# Patient Record
Sex: Male | Born: 1940 | Race: White | Hispanic: No | Marital: Married | Smoking: Former smoker
Health system: Southern US, Community
[De-identification: ages and names within clinical notes are randomized; demographics above are authoritative.]

## PROBLEM LIST (undated history)

## (undated) DIAGNOSIS — I442 Atrioventricular block, complete: Secondary | ICD-10-CM

## (undated) DIAGNOSIS — N200 Calculus of kidney: Secondary | ICD-10-CM

## (undated) DIAGNOSIS — Z950 Presence of cardiac pacemaker: Secondary | ICD-10-CM

## (undated) DIAGNOSIS — I10 Essential (primary) hypertension: Secondary | ICD-10-CM

## (undated) DIAGNOSIS — G4733 Obstructive sleep apnea (adult) (pediatric): Secondary | ICD-10-CM

## (undated) DIAGNOSIS — N186 End stage renal disease: Secondary | ICD-10-CM

## (undated) DIAGNOSIS — K219 Gastro-esophageal reflux disease without esophagitis: Secondary | ICD-10-CM

## (undated) DIAGNOSIS — I2510 Atherosclerotic heart disease of native coronary artery without angina pectoris: Secondary | ICD-10-CM

## (undated) DIAGNOSIS — Z992 Dependence on renal dialysis: Secondary | ICD-10-CM

## (undated) DIAGNOSIS — Z9989 Dependence on other enabling machines and devices: Secondary | ICD-10-CM

## (undated) DIAGNOSIS — H409 Unspecified glaucoma: Secondary | ICD-10-CM

## (undated) DIAGNOSIS — M1990 Unspecified osteoarthritis, unspecified site: Secondary | ICD-10-CM

## (undated) DIAGNOSIS — E119 Type 2 diabetes mellitus without complications: Secondary | ICD-10-CM

## (undated) DIAGNOSIS — H5440 Blindness, one eye, unspecified eye: Secondary | ICD-10-CM

## (undated) DIAGNOSIS — R0902 Hypoxemia: Secondary | ICD-10-CM

## (undated) HISTORY — PX: SHOULDER ARTHROSCOPY: SHX128

## (undated) HISTORY — PX: EYE SURGERY: SHX253

## (undated) HISTORY — PX: OTHER SURGICAL HISTORY: SHX169

## (undated) HISTORY — PX: TONSILLECTOMY: SUR1361

## (undated) HISTORY — DX: Atherosclerotic heart disease of native coronary artery without angina pectoris: I25.10

## (undated) HISTORY — PX: INSERT / REPLACE / REMOVE PACEMAKER: SUR710

## (undated) HISTORY — PX: KNEE ARTHROSCOPY: SUR90

---

## 1997-09-08 HISTORY — PX: CORONARY ANGIOPLASTY WITH STENT PLACEMENT: SHX49

## 1997-12-19 ENCOUNTER — Ambulatory Visit (HOSPITAL_COMMUNITY): Admission: RE | Admit: 1997-12-19 | Discharge: 1997-12-20 | Payer: Self-pay | Admitting: Thoracic Surgery

## 1998-01-16 ENCOUNTER — Encounter (HOSPITAL_COMMUNITY): Admission: RE | Admit: 1998-01-16 | Discharge: 1998-04-16 | Payer: Self-pay | Admitting: Cardiovascular Disease

## 1998-05-29 ENCOUNTER — Encounter: Payer: Self-pay | Admitting: Pulmonary Disease

## 1998-05-29 ENCOUNTER — Ambulatory Visit: Admission: RE | Admit: 1998-05-29 | Discharge: 1998-05-29 | Payer: Self-pay | Admitting: Internal Medicine

## 1998-08-07 ENCOUNTER — Encounter: Admission: RE | Admit: 1998-08-07 | Discharge: 1998-09-24 | Payer: Self-pay | Admitting: Internal Medicine

## 1998-08-08 ENCOUNTER — Ambulatory Visit: Admission: RE | Admit: 1998-08-08 | Discharge: 1998-08-08 | Payer: Self-pay | Admitting: Orthopedic Surgery

## 1998-09-04 ENCOUNTER — Encounter (HOSPITAL_BASED_OUTPATIENT_CLINIC_OR_DEPARTMENT_OTHER): Payer: Self-pay | Admitting: Internal Medicine

## 1998-09-24 ENCOUNTER — Encounter: Admission: RE | Admit: 1998-09-24 | Discharge: 1998-10-25 | Payer: Self-pay | Admitting: Internal Medicine

## 1998-09-25 ENCOUNTER — Encounter: Payer: Self-pay | Admitting: Orthopedic Surgery

## 1999-01-22 ENCOUNTER — Encounter: Admission: RE | Admit: 1999-01-22 | Discharge: 1999-04-22 | Payer: Self-pay | Admitting: Orthopedic Surgery

## 1999-05-14 ENCOUNTER — Ambulatory Visit: Admission: RE | Admit: 1999-05-14 | Discharge: 1999-05-14 | Payer: Self-pay | Admitting: Internal Medicine

## 1999-05-21 ENCOUNTER — Ambulatory Visit (HOSPITAL_COMMUNITY): Admission: RE | Admit: 1999-05-21 | Discharge: 1999-05-21 | Payer: Self-pay | Admitting: Gastroenterology

## 1999-07-01 ENCOUNTER — Encounter: Admission: RE | Admit: 1999-07-01 | Discharge: 1999-09-29 | Payer: Self-pay | Admitting: Orthopedic Surgery

## 1999-07-22 ENCOUNTER — Encounter: Payer: Self-pay | Admitting: Pulmonary Disease

## 1999-10-11 ENCOUNTER — Ambulatory Visit: Admission: RE | Admit: 1999-10-11 | Discharge: 1999-10-11 | Payer: Self-pay | Admitting: Pulmonary Disease

## 1999-10-11 ENCOUNTER — Encounter: Payer: Self-pay | Admitting: Pulmonary Disease

## 1999-10-14 ENCOUNTER — Encounter: Admission: RE | Admit: 1999-10-14 | Discharge: 2000-01-12 | Payer: Self-pay | Admitting: Orthopedic Surgery

## 2000-05-19 ENCOUNTER — Ambulatory Visit (HOSPITAL_COMMUNITY): Admission: RE | Admit: 2000-05-19 | Discharge: 2000-05-19 | Payer: Self-pay | Admitting: Ophthalmology

## 2000-08-26 ENCOUNTER — Ambulatory Visit (HOSPITAL_COMMUNITY): Admission: RE | Admit: 2000-08-26 | Discharge: 2000-08-26 | Payer: Self-pay | Admitting: Gastroenterology

## 2000-10-26 ENCOUNTER — Emergency Department (HOSPITAL_COMMUNITY): Admission: EM | Admit: 2000-10-26 | Discharge: 2000-10-26 | Payer: Self-pay | Admitting: Emergency Medicine

## 2000-10-26 ENCOUNTER — Encounter: Payer: Self-pay | Admitting: Emergency Medicine

## 2001-11-30 ENCOUNTER — Encounter (INDEPENDENT_AMBULATORY_CARE_PROVIDER_SITE_OTHER): Payer: Self-pay | Admitting: Specialist

## 2001-11-30 ENCOUNTER — Ambulatory Visit (HOSPITAL_BASED_OUTPATIENT_CLINIC_OR_DEPARTMENT_OTHER): Admission: RE | Admit: 2001-11-30 | Discharge: 2001-11-30 | Payer: Self-pay | Admitting: Orthopedic Surgery

## 2002-01-24 ENCOUNTER — Encounter (HOSPITAL_BASED_OUTPATIENT_CLINIC_OR_DEPARTMENT_OTHER): Admission: RE | Admit: 2002-01-24 | Discharge: 2002-01-27 | Payer: Self-pay | Admitting: Orthopedic Surgery

## 2002-04-26 ENCOUNTER — Encounter: Payer: Self-pay | Admitting: Orthopedic Surgery

## 2002-04-26 ENCOUNTER — Ambulatory Visit (HOSPITAL_COMMUNITY): Admission: RE | Admit: 2002-04-26 | Discharge: 2002-04-26 | Payer: Self-pay | Admitting: Orthopedic Surgery

## 2003-07-30 ENCOUNTER — Emergency Department (HOSPITAL_COMMUNITY): Admission: AD | Admit: 2003-07-30 | Discharge: 2003-07-30 | Payer: Self-pay | Admitting: Family Medicine

## 2003-09-19 ENCOUNTER — Ambulatory Visit (HOSPITAL_COMMUNITY): Admission: RE | Admit: 2003-09-19 | Discharge: 2003-09-19 | Payer: Self-pay | Admitting: Gastroenterology

## 2003-12-04 ENCOUNTER — Emergency Department (HOSPITAL_COMMUNITY): Admission: EM | Admit: 2003-12-04 | Discharge: 2003-12-04 | Payer: Self-pay | Admitting: Family Medicine

## 2004-05-17 ENCOUNTER — Encounter: Payer: Self-pay | Admitting: Pulmonary Disease

## 2004-05-22 ENCOUNTER — Encounter: Payer: Self-pay | Admitting: Pulmonary Disease

## 2004-06-21 ENCOUNTER — Ambulatory Visit (HOSPITAL_COMMUNITY): Admission: RE | Admit: 2004-06-21 | Discharge: 2004-06-22 | Payer: Self-pay | Admitting: Surgery

## 2005-01-17 ENCOUNTER — Emergency Department (HOSPITAL_COMMUNITY): Admission: EM | Admit: 2005-01-17 | Discharge: 2005-01-17 | Payer: Self-pay | Admitting: Family Medicine

## 2005-01-23 ENCOUNTER — Encounter (INDEPENDENT_AMBULATORY_CARE_PROVIDER_SITE_OTHER): Payer: Self-pay | Admitting: Specialist

## 2005-01-23 ENCOUNTER — Ambulatory Visit (HOSPITAL_COMMUNITY): Admission: RE | Admit: 2005-01-23 | Discharge: 2005-01-23 | Payer: Self-pay | Admitting: Surgery

## 2005-01-23 ENCOUNTER — Ambulatory Visit (HOSPITAL_BASED_OUTPATIENT_CLINIC_OR_DEPARTMENT_OTHER): Admission: RE | Admit: 2005-01-23 | Discharge: 2005-01-23 | Payer: Self-pay | Admitting: Surgery

## 2005-05-31 ENCOUNTER — Emergency Department (HOSPITAL_COMMUNITY): Admission: EM | Admit: 2005-05-31 | Discharge: 2005-05-31 | Payer: Self-pay | Admitting: Emergency Medicine

## 2005-08-12 ENCOUNTER — Ambulatory Visit (HOSPITAL_COMMUNITY): Admission: RE | Admit: 2005-08-12 | Discharge: 2005-08-12 | Payer: Self-pay | Admitting: Ophthalmology

## 2005-09-19 ENCOUNTER — Encounter: Admission: RE | Admit: 2005-09-19 | Discharge: 2005-09-19 | Payer: Self-pay | Admitting: Orthopedic Surgery

## 2006-04-22 ENCOUNTER — Inpatient Hospital Stay (HOSPITAL_COMMUNITY): Admission: RE | Admit: 2006-04-22 | Discharge: 2006-04-23 | Payer: Self-pay | Admitting: Ophthalmology

## 2006-08-17 ENCOUNTER — Ambulatory Visit (HOSPITAL_COMMUNITY): Admission: RE | Admit: 2006-08-17 | Discharge: 2006-08-17 | Payer: Self-pay | Admitting: Ophthalmology

## 2006-08-28 ENCOUNTER — Ambulatory Visit (HOSPITAL_COMMUNITY): Admission: RE | Admit: 2006-08-28 | Discharge: 2006-08-28 | Payer: Self-pay | Admitting: Ophthalmology

## 2006-09-14 ENCOUNTER — Ambulatory Visit (HOSPITAL_COMMUNITY): Admission: RE | Admit: 2006-09-14 | Discharge: 2006-09-14 | Payer: Self-pay | Admitting: Ophthalmology

## 2007-01-04 ENCOUNTER — Ambulatory Visit (HOSPITAL_COMMUNITY): Admission: RE | Admit: 2007-01-04 | Discharge: 2007-01-04 | Payer: Self-pay | Admitting: Ophthalmology

## 2008-09-08 HISTORY — PX: DG AV DIALYSIS  SHUNT ACCESS EXIST*L* OR: HXRAD910

## 2008-10-19 ENCOUNTER — Encounter: Payer: Self-pay | Admitting: Pulmonary Disease

## 2008-11-30 ENCOUNTER — Encounter: Payer: Self-pay | Admitting: Pulmonary Disease

## 2008-12-20 DIAGNOSIS — J4489 Other specified chronic obstructive pulmonary disease: Secondary | ICD-10-CM | POA: Insufficient documentation

## 2008-12-20 DIAGNOSIS — M159 Polyosteoarthritis, unspecified: Secondary | ICD-10-CM | POA: Insufficient documentation

## 2008-12-20 DIAGNOSIS — G4733 Obstructive sleep apnea (adult) (pediatric): Secondary | ICD-10-CM | POA: Insufficient documentation

## 2008-12-20 DIAGNOSIS — J449 Chronic obstructive pulmonary disease, unspecified: Secondary | ICD-10-CM | POA: Insufficient documentation

## 2008-12-20 DIAGNOSIS — Z862 Personal history of diseases of the blood and blood-forming organs and certain disorders involving the immune mechanism: Secondary | ICD-10-CM

## 2008-12-20 DIAGNOSIS — Z8639 Personal history of other endocrine, nutritional and metabolic disease: Secondary | ICD-10-CM

## 2008-12-21 ENCOUNTER — Ambulatory Visit: Payer: Self-pay | Admitting: Pulmonary Disease

## 2008-12-21 DIAGNOSIS — R0602 Shortness of breath: Secondary | ICD-10-CM

## 2008-12-22 ENCOUNTER — Encounter: Payer: Self-pay | Admitting: Pulmonary Disease

## 2008-12-22 ENCOUNTER — Ambulatory Visit (HOSPITAL_COMMUNITY): Admission: RE | Admit: 2008-12-22 | Discharge: 2008-12-22 | Payer: Self-pay | Admitting: Pulmonary Disease

## 2009-01-11 ENCOUNTER — Telehealth (INDEPENDENT_AMBULATORY_CARE_PROVIDER_SITE_OTHER): Payer: Self-pay | Admitting: *Deleted

## 2009-01-15 ENCOUNTER — Ambulatory Visit: Payer: Self-pay | Admitting: Pulmonary Disease

## 2009-05-04 ENCOUNTER — Ambulatory Visit: Payer: Self-pay | Admitting: Vascular Surgery

## 2009-05-10 ENCOUNTER — Ambulatory Visit (HOSPITAL_COMMUNITY): Admission: RE | Admit: 2009-05-10 | Discharge: 2009-05-10 | Payer: Self-pay | Admitting: Vascular Surgery

## 2009-05-10 ENCOUNTER — Ambulatory Visit: Payer: Self-pay | Admitting: Vascular Surgery

## 2009-05-15 ENCOUNTER — Encounter: Admission: RE | Admit: 2009-05-15 | Discharge: 2009-05-15 | Payer: Self-pay | Admitting: Nephrology

## 2009-06-08 ENCOUNTER — Ambulatory Visit: Payer: Self-pay | Admitting: Vascular Surgery

## 2009-06-16 ENCOUNTER — Emergency Department (HOSPITAL_COMMUNITY): Admission: EM | Admit: 2009-06-16 | Discharge: 2009-06-16 | Payer: Self-pay | Admitting: Emergency Medicine

## 2009-06-20 ENCOUNTER — Ambulatory Visit: Payer: Self-pay | Admitting: Vascular Surgery

## 2009-07-04 ENCOUNTER — Ambulatory Visit: Payer: Self-pay | Admitting: Vascular Surgery

## 2009-08-07 ENCOUNTER — Encounter: Admission: RE | Admit: 2009-08-07 | Discharge: 2009-08-07 | Payer: Self-pay | Admitting: Nephrology

## 2009-08-16 ENCOUNTER — Observation Stay (HOSPITAL_COMMUNITY): Admission: RE | Admit: 2009-08-16 | Discharge: 2009-08-16 | Payer: Self-pay | Admitting: Nephrology

## 2009-08-21 ENCOUNTER — Encounter (HOSPITAL_COMMUNITY): Admission: RE | Admit: 2009-08-21 | Discharge: 2009-11-14 | Payer: Self-pay | Admitting: Nephrology

## 2009-09-21 ENCOUNTER — Encounter (HOSPITAL_COMMUNITY): Admission: RE | Admit: 2009-09-21 | Discharge: 2009-12-20 | Payer: Self-pay | Admitting: Nephrology

## 2010-01-30 ENCOUNTER — Ambulatory Visit (HOSPITAL_COMMUNITY): Admission: RE | Admit: 2010-01-30 | Discharge: 2010-01-30 | Payer: Self-pay | Admitting: Gastroenterology

## 2010-02-18 ENCOUNTER — Emergency Department (HOSPITAL_COMMUNITY): Admission: EM | Admit: 2010-02-18 | Discharge: 2010-02-18 | Payer: Self-pay | Admitting: Family Medicine

## 2010-02-18 ENCOUNTER — Encounter (HOSPITAL_COMMUNITY): Admission: RE | Admit: 2010-02-18 | Discharge: 2010-04-17 | Payer: Self-pay | Admitting: Nephrology

## 2010-02-18 ENCOUNTER — Emergency Department (HOSPITAL_COMMUNITY): Admission: EM | Admit: 2010-02-18 | Discharge: 2010-02-19 | Payer: Self-pay | Admitting: Emergency Medicine

## 2010-05-07 ENCOUNTER — Encounter (HOSPITAL_COMMUNITY)
Admission: RE | Admit: 2010-05-07 | Discharge: 2010-08-05 | Payer: Self-pay | Source: Home / Self Care | Admitting: Nephrology

## 2010-06-10 ENCOUNTER — Encounter (HOSPITAL_COMMUNITY)
Admission: RE | Admit: 2010-06-10 | Discharge: 2010-09-08 | Payer: Self-pay | Source: Home / Self Care | Attending: Nephrology | Admitting: Nephrology

## 2010-07-24 ENCOUNTER — Encounter: Admission: RE | Admit: 2010-07-24 | Discharge: 2010-07-24 | Payer: Self-pay | Admitting: Nephrology

## 2010-09-08 HISTORY — PX: DG AV DIALYSIS  SHUNT ACCESS EXIST*L* OR: HXRAD910

## 2010-10-15 ENCOUNTER — Inpatient Hospital Stay (HOSPITAL_COMMUNITY)
Admission: EM | Admit: 2010-10-15 | Discharge: 2010-10-17 | DRG: 202 | Disposition: A | Discharge status: Home / Self Care | Payer: Medicare Other | Source: Ambulatory Visit | Attending: Internal Medicine | Admitting: Internal Medicine

## 2010-10-15 ENCOUNTER — Emergency Department (HOSPITAL_COMMUNITY): Payer: Medicare Other

## 2010-10-15 DIAGNOSIS — D631 Anemia in chronic kidney disease: Secondary | ICD-10-CM | POA: Diagnosis present

## 2010-10-15 DIAGNOSIS — R0789 Other chest pain: Secondary | ICD-10-CM | POA: Diagnosis present

## 2010-10-15 DIAGNOSIS — E1142 Type 2 diabetes mellitus with diabetic polyneuropathy: Secondary | ICD-10-CM | POA: Diagnosis present

## 2010-10-15 DIAGNOSIS — Z794 Long term (current) use of insulin: Secondary | ICD-10-CM

## 2010-10-15 DIAGNOSIS — N058 Unspecified nephritic syndrome with other morphologic changes: Secondary | ICD-10-CM | POA: Diagnosis present

## 2010-10-15 DIAGNOSIS — I4510 Unspecified right bundle-branch block: Secondary | ICD-10-CM | POA: Diagnosis present

## 2010-10-15 DIAGNOSIS — Z79899 Other long term (current) drug therapy: Secondary | ICD-10-CM

## 2010-10-15 DIAGNOSIS — E1129 Type 2 diabetes mellitus with other diabetic kidney complication: Secondary | ICD-10-CM | POA: Diagnosis present

## 2010-10-15 DIAGNOSIS — G4733 Obstructive sleep apnea (adult) (pediatric): Secondary | ICD-10-CM | POA: Diagnosis present

## 2010-10-15 DIAGNOSIS — I2510 Atherosclerotic heart disease of native coronary artery without angina pectoris: Secondary | ICD-10-CM | POA: Diagnosis present

## 2010-10-15 DIAGNOSIS — E1149 Type 2 diabetes mellitus with other diabetic neurological complication: Secondary | ICD-10-CM | POA: Diagnosis present

## 2010-10-15 DIAGNOSIS — D869 Sarcoidosis, unspecified: Secondary | ICD-10-CM | POA: Diagnosis present

## 2010-10-15 DIAGNOSIS — N2581 Secondary hyperparathyroidism of renal origin: Secondary | ICD-10-CM | POA: Diagnosis present

## 2010-10-15 DIAGNOSIS — J99 Respiratory disorders in diseases classified elsewhere: Secondary | ICD-10-CM | POA: Diagnosis present

## 2010-10-15 DIAGNOSIS — E1139 Type 2 diabetes mellitus with other diabetic ophthalmic complication: Secondary | ICD-10-CM | POA: Diagnosis present

## 2010-10-15 DIAGNOSIS — M109 Gout, unspecified: Secondary | ICD-10-CM | POA: Diagnosis present

## 2010-10-15 DIAGNOSIS — E11319 Type 2 diabetes mellitus with unspecified diabetic retinopathy without macular edema: Secondary | ICD-10-CM | POA: Diagnosis present

## 2010-10-15 DIAGNOSIS — Z9861 Coronary angioplasty status: Secondary | ICD-10-CM

## 2010-10-15 DIAGNOSIS — N185 Chronic kidney disease, stage 5: Secondary | ICD-10-CM | POA: Diagnosis present

## 2010-10-15 DIAGNOSIS — Z7982 Long term (current) use of aspirin: Secondary | ICD-10-CM

## 2010-10-15 DIAGNOSIS — K219 Gastro-esophageal reflux disease without esophagitis: Secondary | ICD-10-CM | POA: Diagnosis present

## 2010-10-15 DIAGNOSIS — I120 Hypertensive chronic kidney disease with stage 5 chronic kidney disease or end stage renal disease: Secondary | ICD-10-CM | POA: Diagnosis present

## 2010-10-15 DIAGNOSIS — N039 Chronic nephritic syndrome with unspecified morphologic changes: Secondary | ICD-10-CM | POA: Diagnosis present

## 2010-10-15 DIAGNOSIS — E8779 Other fluid overload: Secondary | ICD-10-CM | POA: Diagnosis present

## 2010-10-15 DIAGNOSIS — J209 Acute bronchitis, unspecified: Principal | ICD-10-CM | POA: Diagnosis present

## 2010-10-15 LAB — URINALYSIS, ROUTINE W REFLEX MICROSCOPIC
Bilirubin Urine: NEGATIVE
Hgb urine dipstick: NEGATIVE
Urobilinogen, UA: 0.2 mg/dL (ref 0.0–1.0)

## 2010-10-15 LAB — CBC
HCT: 30.9 % — ABNORMAL LOW (ref 39.0–52.0)
Hemoglobin: 10.2 g/dL — ABNORMAL LOW (ref 13.0–17.0)
MCV: 99 fL (ref 78.0–100.0)
Platelets: 142 10*3/uL — ABNORMAL LOW (ref 150–400)
RBC: 3.12 MIL/uL — ABNORMAL LOW (ref 4.22–5.81)
WBC: 13.6 10*3/uL — ABNORMAL HIGH (ref 4.0–10.5)

## 2010-10-15 LAB — URINE MICROSCOPIC-ADD ON

## 2010-10-15 LAB — BASIC METABOLIC PANEL
Calcium: 9.5 mg/dL (ref 8.4–10.5)
Chloride: 109 mEq/L (ref 96–112)
GFR calc Af Amer: 9 mL/min — ABNORMAL LOW (ref >60)
Glucose, Bld: 130 mg/dL — ABNORMAL HIGH (ref 70–99)
Potassium: 4.8 mEq/L (ref 3.5–5.1)
Sodium: 144 mEq/L (ref 135–145)

## 2010-10-15 LAB — POCT CARDIAC MARKERS

## 2010-10-15 LAB — DIFFERENTIAL: Eosinophils Relative: 2 % (ref 0–5)

## 2010-10-16 ENCOUNTER — Inpatient Hospital Stay (HOSPITAL_COMMUNITY): Payer: Medicare Other

## 2010-10-16 DIAGNOSIS — M7989 Other specified soft tissue disorders: Secondary | ICD-10-CM

## 2010-10-16 LAB — CBC
MCH: 32.2 pg (ref 26.0–34.0)
MCHC: 32.1 g/dL (ref 30.0–36.0)
MCV: 100.4 fL — ABNORMAL HIGH (ref 78.0–100.0)
Platelets: 116 10*3/uL — ABNORMAL LOW (ref 150–400)
RDW: 15.3 % (ref 11.5–15.5)

## 2010-10-16 LAB — RENAL FUNCTION PANEL
Albumin: 3.2 g/dL — ABNORMAL LOW (ref 3.5–5.2)
BUN: 103 mg/dL — ABNORMAL HIGH (ref 6–23)
Creatinine, Ser: 7.24 mg/dL — ABNORMAL HIGH (ref 0.4–1.5)
Phosphorus: 5.9 mg/dL — ABNORMAL HIGH (ref 2.3–4.6)
Potassium: 4.9 mEq/L (ref 3.5–5.1)

## 2010-10-16 LAB — CK TOTAL AND CKMB (NOT AT ARMC)
Relative Index: 2.1 (ref 0.0–2.5)
Relative Index: INVALID (ref 0.0–2.5)
Relative Index: 2.8 — ABNORMAL HIGH (ref 0.0–2.5)
Total CK: 107 U/L (ref 7–232)
Total CK: 88 U/L (ref 7–232)
Total CK: 140 U/L (ref 7–232)

## 2010-10-16 LAB — GLUCOSE, CAPILLARY

## 2010-10-16 LAB — TROPONIN I
Troponin I: 0.02 ng/mL (ref 0.00–0.06)
Troponin I: 0.03 ng/mL (ref 0.00–0.06)

## 2010-10-16 MED ORDER — TECHNETIUM TO 99M ALBUMIN AGGREGATED
6.0000 | Freq: Once | INTRAVENOUS | Status: AC | PRN
  Administered 2010-10-16: 6 via INTRAVENOUS

## 2010-10-16 MED ORDER — XENON XE 133 GAS
10.0000 | GAS_FOR_INHALATION | Freq: Once | RESPIRATORY_TRACT | Status: AC | PRN
  Administered 2010-10-16: 10 via RESPIRATORY_TRACT

## 2010-10-17 LAB — RENAL FUNCTION PANEL
BUN: 112 mg/dL — ABNORMAL HIGH (ref 6–23)
CO2: 22 mEq/L (ref 19–32)
Glucose, Bld: 176 mg/dL — ABNORMAL HIGH (ref 70–99)
Potassium: 4.8 mEq/L (ref 3.5–5.1)
Sodium: 142 mEq/L (ref 135–145)

## 2010-10-17 LAB — GLUCOSE, CAPILLARY
Glucose-Capillary: 196 mg/dL — ABNORMAL HIGH (ref 70–99)
Glucose-Capillary: 137 mg/dL — ABNORMAL HIGH (ref 70–99)

## 2010-10-17 LAB — PTH, INTACT AND CALCIUM
Calcium, Total (PTH): 8.4 mg/dL (ref 8.4–10.5)
PTH: 304.9 pg/mL — ABNORMAL HIGH (ref 14.0–72.0)

## 2010-10-17 LAB — CBC
HCT: 26.8 % — ABNORMAL LOW (ref 39.0–52.0)
Hemoglobin: 8.7 g/dL — ABNORMAL LOW (ref 13.0–17.0)
MCHC: 32.5 g/dL (ref 30.0–36.0)
MCV: 99.6 fL (ref 78.0–100.0)

## 2010-10-21 NOTE — Discharge Summary (Signed)
NAME:  Antonio Page, Antonio Page NO.:  [id number]  MEDICAL RECORD NO.:  [id number]           PATIENT TYPE:  I  LOCATION:  6735                         FACILITY:  [facility]  PHYSICIAN:  Geoffry Paradise, M.D.  DATE OF BIRTH:  1941/05/12  DATE OF ADMISSION:  10/15/2010 DATE OF DISCHARGE:                              DISCHARGE SUMMARY   DIAGNOSES AT THE TIME OF DISCHARGE: 1. Acute bronchitis. 2. Atypical/pleuritic chest pain, noncardiac. 3. Diabetes mellitus with neuropathy, nephropathy, and retinopathy. 4. 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 5. Essential hypertension. 6. Atherosclerotic coronary artery disease status post PCI 1999. 7. Obstructive sleep apnea on CPAP. 8. History of sarcoidosis pulmonary.  HISTORY OF PRESENT ILLNESS:  [name] is a pleasant 70 year old well- known to myself for several years with prior coronary history including remote PCI, diabetes with nephropathy, retinopathy, and neuropathy, currently well controlled, chronic renal insufficiency stage V impending dialysis needs, sarcoidosis presenting with cough and chest pain.  He evidently had a 3-day history of productive cough with thick yellow sputum and some wheezing but because of the onset of severe substernal chest pain presented to the emergency room for fears of it being cardiac disease.  Upon further exploration, pain was sharp with a very clear pleuritic nature.  Additionally preceding this is a purulent bronchitic picture.  He has had no recent exertional type chest pain.  He had no bloody sputum, nausea, vomiting.  He has had some low-grade fevers.  He does have chronic lower extremity edema which is unchanged.  He was seen in the emergency room by my partner, admitted for IV antibiotics, bronchodilators, and monitoring to rule out cardiac disease but it was felt that this was of low likelihood.  For details, see the dictated summary February 2012.  DATA:  Chest  x-ray, bronchitic type changes, chronic elevation in left hemidiaphragm, otherwise clear.  Venous Doppler's bilateral were negative.  Chemistry:  Sodium 142, potassium 4.8, chloride 108, CO2 22, glucose 176, BUN 112, creatinine 7.84, calcium 8.7, phosphorous is 6.3. CBC:  Hemoglobin 8.7, hematocrit 26.8, white blood count is 8.9, platelet count is 112,000.  CKs were 140 and 88 respectively with insignificant MBs, troponin's were 0.03 and 0.03 respectively. Urinalysis negative.  HOSPITAL COURSE:  The patient was admitted, treated with bronchodilators, IV Avelox and cardiac enzymes were cycled to rule out possible underlying cardiac etiology.  All enzymes were negative in the clinical picture to include exam and history were most compatible with an acute bronchitis with pleuritic-type chest pain.  This indeed did resolve and improve with IV Avelox and bronchodilators as well as Tylenol.  We did check venous Doppler's with no evidence of DVT and again pulmonary embolism was not felt to be likely.  Renal did evaluate the patient to be certain dialysis needs were not impending.  It was felt at this time there were no dialysis needs and continued observation and close renal followup as an outpatient, all parties knowing dialysis is imminent.  On the morning of discharge, the patient was ready and insisting upon discharge as he was back to baseline despite my encouragement that it  was probably best to observe him one further day. Regardless upon his insistence and given his medical stability, he is discharged in improved and stable condition.  The patient is ambulating and tolerating his nasal CPAP at night.  His cough is minimal at this point with no further chest pain and lung fields are clear.  His lower extremity swelling is baseline with no evidence of calf symptoms.  He is eating well and is stable for discharge.  Blood sugars have been relatively stable and blood pressure while being  hypertensive at times is improving to be followed up as an outpatient as in the outpatient setting of these medications, he was doing quite well.  His Lasix has been increased by renal and this should assist with that as well.  The patient is discharged on a renal diet and this has been educated multiple times in the past.  He has also been educated on diabetes management.  Last A1c in the office was 6.8.  Discharge medications include: 1. Furosemide 160 b.i.d. 2. Avelox 400 daily for 7 days. 3. Allopurinol 300, 1/2 daily. 4. Amlodipine 10 daily. 5. Aspirin 81 mg daily. 6. Claritin 1 daily as needed. 7. 70/30 insulin, 40 units b.i.d. subcu. 8. Hydralazine 25 t.i.d. 9. MiraLax 17 grams every morning. 10.Nephro-Vite 1 every morning. 11.Prilosec/omeprazole 20 mg daily. 12.Renvela 800 mg 2 tablets 3 times a day with meals. 13.Sodium bicarbonate 325, 1 tablet 3 times a day. 14.Tums extra strength 2 tablets 3 times a day. 15.Tylenol 650 q.4 p.r.n. pain. 16.Vitamin C 500 every morning. 17.Zaroxolyn 5 mg 1 tablet by mouth daily as needed for excessive     fluid and he is instructed on this as well.  The patient will follow up with Dr. Jacky Kindle in approximately 4-6 weeks but he will have close followup with the renal team as instructed by them as dialysis needs are potentially impending.          ______________________________ Geoffry Paradise, M.D.     RA/MEDQ  D:  10/17/2010  T:  10/17/2010  Job:  [id number]  cc:   Dr. Allena Katz  Electronically Signed by Geoffry Paradise M.D. on 10/21/2010 09:21:38 PM

## 2010-10-28 NOTE — H&P (Signed)
NAME:  Antonio Page, Antonio Page NO.:  [id number]  MEDICAL RECORD NO.:  [id number]           PATIENT TYPE:  E  LOCATION:  [facility]                         FACILITY:  [facility]  PHYSICIAN:  Kari Baars, M.D.  DATE OF BIRTH:  Jun 18, 1941  DATE OF ADMISSION:  10/15/2010 DATE OF DISCHARGE:                             HISTORY & PHYSICAL   PRIMARY CARE PHYSICIAN:  Geoffry Paradise, MD  CARDIOLOGIST:  Nanetta Batty, MD.  NEPHROLOGIST:  Dr. Allena Katz.  CHIEF COMPLAINT:  Cough and chest pain.  HISTORY OF PRESENT ILLNESS:  Antonio Page is a 70 year old white male with a history of coronary artery disease, status post remote PCI (March 1999), diabetes with triopathy, chronic renal insufficiency (stage IV/V with impending need for dialysis), and sarcoidosis, who presented to the emergency department with complaint of cough and chest pain.  The patient reports a 3-day history of productive cough associated with thick yellow sputum, mild wheezing, and mild shortness of breath. Today, he developed a severe sternal chest pain, which he states is worse with deep breathing.  It is a sharp pain located over his lower sternum.  The pain has lasted throughout the afternoon and early evening at least 4 to 5 hours.  It has improved some in the emergency department, though he is still a little sore.  While in the emergency department, he developed a temperature of 101.1.  His oxygen saturations were found to be 90% on room air.  He was started on oxygen therapy. Chest x-ray shows chronic sarcoidosis, but no acute cardiopulmonary disease.  The patient denies any recent travel except for his day trip to the mountains and denies any surgery.  He has chronic bilateral lower extremity edema, which is unchanged.  He has had recurrent bronchitis (at least four episodes in the past year) that has been treated with outpatient antibiotics.  He states that he feels like this is similar to his prior bronchitis  episodes, though the chest pain has been more severe.  He did not had any recent exertional chest pain.  REVIEW OF SYSTEMS:  All systems reviewed with the patient are negative except in the HPI.  PAST MEDICAL HISTORY: 1. Diabetes mellitus with nephropathy, retinopathy, and neuropathy. 2. Chronic renal insufficiency - stage IV to V, status post fistula     placement with impending need for dialysis in the near future. 3. Coronary artery disease, status post PCI with stent (March 1999). 4. Obstructive sleep apnea on CPAP. 5. Paroxysmal atrial flutter. 6. Sarcoidosis. 7. Gout. 8. Gastroesophageal reflux disease. 9. Status post right shoulder surgery. 10.Status post umbilical hernia repair.  CURRENT MEDICATIONS: 1. Allopurinol 300 mg one-half daily. 2. Amlodipine 10 mg one-half daily. 3. Aspirin 81 mg daily. 4. Furosemide 80 mg b.i.d. 5. Novolin 70/30, 40 units twice a day. 6. Hydralazine 50 units b.i.d. 7. Multivitamin daily. 8. Prilosec daily. 9. Procrit per Nephrology. 10.Renvela. 11.Sodium bicarbonate. 12.Tums. 13.Vitamin C. 14.Claritin. 15.Tylenol.  ALLERGIES:  NO KNOWN DRUG ALLERGIES.  SOCIAL HISTORY:  He is married.  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  He has a prior cigar smoking history, but none  currently. Denies alcohol or drug use.  FAMILY HISTORY:  Father died of coronary artery disease and asthma, and brother had cancer.  PHYSICAL EXAM:  VITAL SIGNS:  Temperature 101.1, blood pressure initially 179/81, currently 148/44, pulse 73 to 111, respirations 20, oxygen saturation 90% on room air to 96% on 2 liters. GENERAL:  Obese, chronically ill gentleman in no acute distress. HEENT:  Right sclera scarring with no pupillary reflex.  Oropharynx is moist. NECK:  Supple without lymphadenopathy or JVD. HEART:  Regular rate and rhythm without murmurs, rubs or gallops. LUNGS:  Minimal basal rhonchi with wheezing. ABDOMEN:  Protuberant, nondistended with  normoactive bowel sounds. EXTREMITIES:  1+ bilateral lower extremity edema.  LABORATORY DATA:  CBC shows a white count of 13.6, hemoglobin 10.2, platelets 142.  BMET significant for sodium 144, potassium 4.8, chloride 109, bicarb 23, BUN 97, creatinine 7.0, glucose 130.  Urinalysis is negative.  Troponin less than 0.05.  STUDIES: 1. Chest x-ray, I personally reviewed shows chronic hilar thickening     with interstitial prominence, likely related to sarcoidosis.  No     acute findings. 2. EKG shows an old right bundle branch block.  ASSESSMENT/PLAN: 1. Acute bronchitis - his fever, leukocytosis, and productive sputum     are most consistent with bronchitis is the cause of his symptoms.     He may also have early community-acquired pneumonia with lagging     chest x-ray changes.  I suspect his chest pain is related to     pleuritic chest pain related to his bronchitis.  He will be     admitted for IV antibiotics with Avelox and repeat chest x-ray in     the morning to rule out a developing pneumonia. 2. Atypical/pleuritic chest pain - I suspect that he has pleuritic     chest pain related to acute bronchitis.  He is at high risk for     coronary artery disease with ischemia, but his history is atypical     for cardiac sources.  We will rule out myocardial infarction with     serial cardiac enzymes and EKGs.  His current EKG is unchanged.  I     also doubt that he has had a pulmonary embolism given that his     other symptoms are most suggestive of an infectious etiology.     Unable to perform a CT angiogram due to his chronic renal     insufficiency and I would suspect that a VQ would be non-diagnostic     in this patient with medical comorbidities.  It hypoxia persists or     chest pain worsens, we will reconsider. 3. Chronic renal insufficiency (stage IV-V).  Anticipate need for     hemodialysis soon.  We will avoid nephrotoxins.  No acute need for     dialysis at this point.  We  will monitor his electrolytes including     his phosphorus. 4. Diabetes mellitus with neuropathy, nephropathy, and retinopathy -     we will change his insulin therapy to Lantus 30 units b.i.d. and     cover with sliding scale insulin while he is an inpatient.  We will     transition back to his premix 70/30 at home.  Monitor sugars with     Avelox as they may be labile. 5. Obstructive sleep apnea - continue CPAP. 6. Sarcoidosis - his sarcoidosis may be obscuring his chest x-ray     results.  We will continue to  monitor. 7. Deep venous thrombosis prophylaxis with Lovenox. 8. Disposition - anticipate discharge in 1 to 2 days if his cardiac     enzymes are negative and his fever and hypoxia improved with     antibiotic therapy.     Kari Baars, M.D.     WS/MEDQ  D:  10/16/2010  T:  10/16/2010  Job:  [id number]  cc:   Geoffry Paradise, MD Nanetta Batty, M.D. Dr. Allena Katz  Electronically Signed by Lacretia Nicks. Buren Kos M.D. on 10/28/2010 09:50:38 PM

## 2010-11-06 ENCOUNTER — Other Ambulatory Visit (HOSPITAL_COMMUNITY): Payer: Self-pay | Admitting: Nephrology

## 2010-11-06 DIAGNOSIS — N289 Disorder of kidney and ureter, unspecified: Secondary | ICD-10-CM

## 2010-11-07 ENCOUNTER — Ambulatory Visit (HOSPITAL_COMMUNITY)
Admission: RE | Admit: 2010-11-07 | Discharge: 2010-11-07 | Disposition: A | Discharge status: Home / Self Care | Payer: Medicare Other | Source: Ambulatory Visit | Attending: Nephrology | Admitting: Nephrology

## 2010-11-07 DIAGNOSIS — K219 Gastro-esophageal reflux disease without esophagitis: Secondary | ICD-10-CM | POA: Insufficient documentation

## 2010-11-07 DIAGNOSIS — E11319 Type 2 diabetes mellitus with unspecified diabetic retinopathy without macular edema: Secondary | ICD-10-CM | POA: Insufficient documentation

## 2010-11-07 DIAGNOSIS — E1149 Type 2 diabetes mellitus with other diabetic neurological complication: Secondary | ICD-10-CM | POA: Insufficient documentation

## 2010-11-07 DIAGNOSIS — M109 Gout, unspecified: Secondary | ICD-10-CM | POA: Insufficient documentation

## 2010-11-07 DIAGNOSIS — E1142 Type 2 diabetes mellitus with diabetic polyneuropathy: Secondary | ICD-10-CM | POA: Insufficient documentation

## 2010-11-07 DIAGNOSIS — G4733 Obstructive sleep apnea (adult) (pediatric): Secondary | ICD-10-CM | POA: Insufficient documentation

## 2010-11-07 DIAGNOSIS — N186 End stage renal disease: Secondary | ICD-10-CM | POA: Insufficient documentation

## 2010-11-07 DIAGNOSIS — N289 Disorder of kidney and ureter, unspecified: Secondary | ICD-10-CM

## 2010-11-07 DIAGNOSIS — I120 Hypertensive chronic kidney disease with stage 5 chronic kidney disease or end stage renal disease: Secondary | ICD-10-CM | POA: Insufficient documentation

## 2010-11-07 DIAGNOSIS — E1139 Type 2 diabetes mellitus with other diabetic ophthalmic complication: Secondary | ICD-10-CM | POA: Insufficient documentation

## 2010-11-07 DIAGNOSIS — D869 Sarcoidosis, unspecified: Secondary | ICD-10-CM | POA: Insufficient documentation

## 2010-11-07 LAB — CBC
HCT: 30.5 % — ABNORMAL LOW (ref 39.0–52.0)
MCH: 31.8 pg (ref 26.0–34.0)
MCHC: 32.1 g/dL (ref 30.0–36.0)
MCV: 99 fL (ref 78.0–100.0)
RDW: 14.3 % (ref 11.5–15.5)

## 2010-11-13 ENCOUNTER — Other Ambulatory Visit (HOSPITAL_COMMUNITY): Payer: Self-pay | Admitting: Critical Care Medicine

## 2010-11-13 DIAGNOSIS — N186 End stage renal disease: Secondary | ICD-10-CM

## 2010-11-15 NOTE — Consult Note (Signed)
NAME:  Antonio Page, HEFFERN NO.:  [id number]  MEDICAL RECORD NO.:  [id number]           PATIENT TYPE:  LOCATION:                                 FACILITY:  PHYSICIAN:  Dyke Maes, M.D.DATE OF BIRTH:  Jan 23, 1941  DATE OF CONSULTATION:  10/16/2010 DATE OF DISCHARGE:                                CONSULTATION   REFERRING PHYSICIAN:  Geoffry Paradise, MD  REASON FOR CONSULTATION:  The patient has chronic kidney disease, volume overload, hypertension, secondary hyperparathyroidism, anemia.  HISTORY OF PRESENT ILLNESS:  This is a 70 year old white male with chronic kidney disease, stage V, secondary diabetes and hypertension who was admitted yesterday for cough, shortness of breath, chest pain, and fever.  He has been followed at [facility] by Dr. Allena Katz with a baseline serum creatinine in mid to upper 6.  He has a fistula in place and is ready to start dialysis when the time is appropriate.  At the present time, he denies any overt uremic symptoms.  He does have edema which he has been battling he says for the last few months though he is not on any fluid restriction.  His chest x-ray in the emergency room showed no overt infiltrate.  He is scheduled for a V/Q scan and Dopplers of his lower extremities today.  He does receive Procrit every other week, and he says his hemoglobin has been lower recently.  PAST MEDICAL HISTORY:  Significant for: 1. CKD V as noted above. 2. Diabetes x32 years. 3. Hypertension x12 years. 4. Gout. 5. Allergic rhinitis. 6. Gastroesophageal reflux disease. 7. Coronary artery disease status post PCI in 1999. 8. History of sarcoid. 9. Obstructive sleep apnea on CPAP. 10.He is status post right shoulder surgery. 11.He is status post umbilical hernia repair.  ALLERGIES:  None.  MEDICATIONS: 1. Hydralazine, questionable dose, our records show 50 mg t.i.d. 2. Amlodipine 10 mg daily. 3. Furosemide 80 mg b.i.d. 4.  Aspirin 81 mg a day. 5. Sodium bicarb 325 mg t.i.d. 6. Renvela 800 mg two with meals. 7. Omeprazole 20 mg a day. 8. Allopurinol 150 mg a day. 9. Tums two with each meal. 10.Nephro-Vite one a day. 11.Humulin N 70/30 - 40 b.i.d. 12.Procrit 20,000 units q.2 weeks. 13.Vitamin C 500 mg a day.  SOCIAL HISTORY:  He is an ex-smoker, quit in 1997.  He denies drinking. He is married, lives with his wife in [place].  FAMILY HISTORY:  Father died at age 73 of "heart problems."  Mother is still alive at the age of 51.  He has one cousin with end-stage renal disease secondary to diabetes.  REVIEW OF SYSTEMS:  Appetite has been good.  Energy level is decreased though stable.  His breathing is slightly better today than it was yesterday.  His chest pain has resolved.  He still has a mild cough that is productive at times.  No recent change in bowel habits.  No change in urine output.  No dysuria.  No new arthritic complaints.  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  No new skin rashes.  Rest of review of systems  unremarkable.  PHYSICAL EXAMINATION:  VITAL SIGNS:  Blood pressure 149/64, pulse 58, temperature 97.9. GENERAL:  This is a 70 year old white male, in no acute distress. HEENT:  Sclerae nonicteric.  He is blind in his right eye. NECK:  Mild JVD. LUNGS:  Decreased breath sounds in bases but clear. HEART:  Regular rate and rhythm with a 1/6 systolic murmur at the left sternal border. ABDOMEN:  Positive bowel sounds, nontender, nondistended.  No hepatosplenomegaly. EXTREMITIES:  2+ edema.  He has got a left upper arm AV fistula with nice thrill and bruit. NEUROLOGIC:  Cranial nerves intact.  Motor intact.  Decreased sensation in both feet.  He is oriented x3.  No asterixis.  LABORATORY DATA:  Sodium 144, potassium 4.9, bicarb 24, BUN 103, creatinine 7.2, phosphorous 5.9, calcium 8.6, albumin 3.2.  Hemoglobin 8.9, white count 10,000, platelet count 116,000.   Urinalysis is benign except for 100 mg of protein.  IMPRESSION: 1. Chronic kidney disease, stage V, secondary to diabetes,     hypertension, stable. 2. Volume overload. 3. Anemia. 4. Secondary hyperparathyroidism. 5. Bronchitis. 6. Hypertension. 7. Diabetes.  PLAN:  We will increase his Lasix to 160 mg b.i.d.  We will fluid restrict.  We will ask the dietitian to see him to go over renal diet. We will give him an extra dose of Procrit this week because of his low hemoglobin and we will check a PTH level.  We will resume his Renvela, thigh-high TED hose, recheck hemoglobin in the morning along with renal function.  Thank you very much for consult.  We will follow the patient with you.          ______________________________ Dyke Maes, M.D.     MTM/MEDQ  D:  10/16/2010  T:  10/17/2010  Job:  [id number]  Electronically Signed by Primitivo Gauze M.D. on 11/14/2010 07:09:42 PM

## 2010-11-21 ENCOUNTER — Ambulatory Visit (HOSPITAL_COMMUNITY)
Admission: RE | Admit: 2010-11-21 | Discharge: 2010-11-21 | Disposition: A | Discharge status: Home / Self Care | Payer: Medicare Other | Source: Ambulatory Visit | Attending: Critical Care Medicine | Admitting: Critical Care Medicine

## 2010-11-21 DIAGNOSIS — I120 Hypertensive chronic kidney disease with stage 5 chronic kidney disease or end stage renal disease: Secondary | ICD-10-CM | POA: Insufficient documentation

## 2010-11-21 DIAGNOSIS — T82898A Other specified complication of vascular prosthetic devices, implants and grafts, initial encounter: Secondary | ICD-10-CM | POA: Insufficient documentation

## 2010-11-21 DIAGNOSIS — Z992 Dependence on renal dialysis: Secondary | ICD-10-CM | POA: Insufficient documentation

## 2010-11-21 DIAGNOSIS — N186 End stage renal disease: Secondary | ICD-10-CM

## 2010-11-21 DIAGNOSIS — Y832 Surgical operation with anastomosis, bypass or graft as the cause of abnormal reaction of the patient, or of later complication, without mention of misadventure at the time of the procedure: Secondary | ICD-10-CM | POA: Insufficient documentation

## 2010-11-21 DIAGNOSIS — J45909 Unspecified asthma, uncomplicated: Secondary | ICD-10-CM | POA: Insufficient documentation

## 2010-11-21 DIAGNOSIS — E669 Obesity, unspecified: Secondary | ICD-10-CM | POA: Insufficient documentation

## 2010-11-21 DIAGNOSIS — M109 Gout, unspecified: Secondary | ICD-10-CM | POA: Insufficient documentation

## 2010-11-21 DIAGNOSIS — D649 Anemia, unspecified: Secondary | ICD-10-CM | POA: Insufficient documentation

## 2010-11-21 LAB — GLUCOSE, CAPILLARY: Glucose-Capillary: 130 mg/dL — ABNORMAL HIGH (ref 70–99)

## 2010-11-21 MED ORDER — IOHEXOL 300 MG/ML  SOLN
50.0000 mL | Freq: Once | INTRAMUSCULAR | Status: AC | PRN
  Administered 2010-11-21: 6 mL via INTRAVENOUS

## 2010-11-25 ENCOUNTER — Ambulatory Visit (INDEPENDENT_AMBULATORY_CARE_PROVIDER_SITE_OTHER): Payer: Medicare Other | Admitting: Surgery

## 2010-11-25 DIAGNOSIS — N186 End stage renal disease: Secondary | ICD-10-CM

## 2010-11-25 LAB — DIFFERENTIAL
Basophils Relative: 0 % (ref 0–1)
Eosinophils Absolute: 0.4 10*3/uL (ref 0.0–0.7)
Lymphs Abs: 0.8 10*3/uL (ref 0.7–4.0)
Monocytes Absolute: 0.9 10*3/uL (ref 0.1–1.0)
Monocytes Relative: 6 % (ref 3–12)
Neutro Abs: 12.3 10*3/uL — ABNORMAL HIGH (ref 1.7–7.7)
Neutrophils Relative %: 86 % — ABNORMAL HIGH (ref 43–77)

## 2010-11-25 LAB — CROSSMATCH
ABO/RH(D): O POS
Antibody Screen: NEGATIVE

## 2010-11-25 LAB — CBC
MCV: 97.3 fL (ref 78.0–100.0)
Platelets: 120 10*3/uL — ABNORMAL LOW (ref 150–400)
WBC: 14.4 10*3/uL — ABNORMAL HIGH (ref 4.0–10.5)

## 2010-11-25 LAB — URINALYSIS, ROUTINE W REFLEX MICROSCOPIC
Bilirubin Urine: NEGATIVE
Hgb urine dipstick: NEGATIVE
Ketones, ur: NEGATIVE mg/dL
Specific Gravity, Urine: 1.013 (ref 1.005–1.030)
pH: 5 (ref 5.0–8.0)

## 2010-11-25 LAB — POCT I-STAT, CHEM 8
Calcium, Ion: 1.2 mmol/L (ref 1.12–1.32)
Chloride: 117 mEq/L — ABNORMAL HIGH (ref 96–112)
Chloride: 115 mEq/L — ABNORMAL HIGH (ref 96–112)
Creatinine, Ser: 6 mg/dL — ABNORMAL HIGH (ref 0.4–1.5)
Glucose, Bld: 120 mg/dL — ABNORMAL HIGH (ref 70–99)
HCT: 27 % — ABNORMAL LOW (ref 39.0–52.0)
Hemoglobin: 9.2 g/dL — ABNORMAL LOW (ref 13.0–17.0)
Potassium: 4.8 mEq/L (ref 3.5–5.1)
Potassium: 6.1 mEq/L — ABNORMAL HIGH (ref 3.5–5.1)
Sodium: 146 mEq/L — ABNORMAL HIGH (ref 135–145)

## 2010-11-25 LAB — COMPREHENSIVE METABOLIC PANEL
AST: 21 U/L (ref 0–37)
Albumin: 3.9 g/dL (ref 3.5–5.2)
Chloride: 114 mEq/L — ABNORMAL HIGH (ref 96–112)
Creatinine, Ser: 5.94 mg/dL — ABNORMAL HIGH (ref 0.4–1.5)
GFR calc Af Amer: 11 mL/min — ABNORMAL LOW (ref >60)
Total Bilirubin: 0.8 mg/dL (ref 0.3–1.2)

## 2010-11-25 LAB — GLUCOSE, CAPILLARY
Glucose-Capillary: 152 mg/dL — ABNORMAL HIGH (ref 70–99)
Glucose-Capillary: 154 mg/dL — ABNORMAL HIGH (ref 70–99)

## 2010-11-25 LAB — URINE MICROSCOPIC-ADD ON

## 2010-11-26 ENCOUNTER — Ambulatory Visit: Payer: Medicare Other | Admitting: Vascular Surgery

## 2010-11-26 NOTE — Assessment & Plan Note (Addendum)
OFFICE VISIT  Antonio Page, Antonio Page DOB:  29-Jan-1941                                       11/25/2010 [id number]#:[id number]  The patient comes today having recently undergone a fistulogram by radiology which demonstrated an occluded left upper arm fistula.  This was placed by Dr. Arbie Cookey on May 10, 2009.  It was never used.  A right-sided catheter was placed by interventional radiology.  The patient comes back today to discuss options for new access.  PHYSICAL EXAMINATION:  Heart rate 63, blood pressure 145/80, respirations 16.  General:  He is well-appearing, in no distress. Respirations:  Nonlabored.  Cardiovascular:  Regular rate and rhythm. He has palpable right brachial, right radial and right ulnar pulse.  The left upper arm fistula is patent to the mid upper arm but then occludes.  DIAGNOSTIC STUDIES:  I have reviewed his vein mapping performed several years ago.  It shows an adequate upper arm vein on the right.  ASSESSMENT:  End-stage renal disease needing new access.  PLAN:  I think the patient is a good candidate for a right upper arm AV fistula.  I have scheduled this for this Thursday, March 22nd.  The risks and benefits were discussed with the patient including the risk of non maturity and future interventions.  All of his questions were answered today.    Jorge Ny, MD Electronically Signed  VWB/MEDQ  D:  11/25/2010  T:  11/26/2010  Job:  [id number]  cc:   Allena Katz, M.D.

## 2010-11-28 ENCOUNTER — Ambulatory Visit (HOSPITAL_COMMUNITY)
Admission: RE | Admit: 2010-11-28 | Discharge: 2010-11-28 | Disposition: A | Discharge status: Home / Self Care | Payer: Medicare Other | Source: Ambulatory Visit | Attending: Surgery | Admitting: Surgery

## 2010-11-28 DIAGNOSIS — G4733 Obstructive sleep apnea (adult) (pediatric): Secondary | ICD-10-CM | POA: Insufficient documentation

## 2010-11-28 DIAGNOSIS — E669 Obesity, unspecified: Secondary | ICD-10-CM | POA: Insufficient documentation

## 2010-11-28 DIAGNOSIS — N186 End stage renal disease: Secondary | ICD-10-CM | POA: Insufficient documentation

## 2010-11-28 DIAGNOSIS — I2510 Atherosclerotic heart disease of native coronary artery without angina pectoris: Secondary | ICD-10-CM | POA: Insufficient documentation

## 2010-11-28 DIAGNOSIS — I120 Hypertensive chronic kidney disease with stage 5 chronic kidney disease or end stage renal disease: Secondary | ICD-10-CM | POA: Insufficient documentation

## 2010-11-28 DIAGNOSIS — E119 Type 2 diabetes mellitus without complications: Secondary | ICD-10-CM | POA: Insufficient documentation

## 2010-11-28 LAB — SURGICAL PCR SCREEN
MRSA, PCR: POSITIVE — AB
Staphylococcus aureus: POSITIVE — AB

## 2010-11-28 LAB — POCT I-STAT 4, (NA,K, GLUC, HGB,HCT)
Hemoglobin: 12.9 g/dL — ABNORMAL LOW (ref 13.0–17.0)
Sodium: 139 mEq/L (ref 135–145)

## 2010-12-02 ENCOUNTER — Emergency Department (HOSPITAL_COMMUNITY)
Admission: EM | Admit: 2010-12-02 | Discharge: 2010-12-02 | Disposition: A | Discharge status: Home / Self Care | Payer: Medicare Other | Attending: Emergency Medicine | Admitting: Emergency Medicine

## 2010-12-02 DIAGNOSIS — R209 Unspecified disturbances of skin sensation: Secondary | ICD-10-CM | POA: Insufficient documentation

## 2010-12-02 DIAGNOSIS — I129 Hypertensive chronic kidney disease with stage 1 through stage 4 chronic kidney disease, or unspecified chronic kidney disease: Secondary | ICD-10-CM | POA: Insufficient documentation

## 2010-12-02 DIAGNOSIS — D869 Sarcoidosis, unspecified: Secondary | ICD-10-CM | POA: Insufficient documentation

## 2010-12-02 DIAGNOSIS — Z7982 Long term (current) use of aspirin: Secondary | ICD-10-CM | POA: Insufficient documentation

## 2010-12-02 DIAGNOSIS — I2510 Atherosclerotic heart disease of native coronary artery without angina pectoris: Secondary | ICD-10-CM | POA: Insufficient documentation

## 2010-12-02 DIAGNOSIS — Z862 Personal history of diseases of the blood and blood-forming organs and certain disorders involving the immune mechanism: Secondary | ICD-10-CM | POA: Insufficient documentation

## 2010-12-02 DIAGNOSIS — Z79899 Other long term (current) drug therapy: Secondary | ICD-10-CM | POA: Insufficient documentation

## 2010-12-02 DIAGNOSIS — E119 Type 2 diabetes mellitus without complications: Secondary | ICD-10-CM | POA: Insufficient documentation

## 2010-12-02 DIAGNOSIS — M79609 Pain in unspecified limb: Secondary | ICD-10-CM | POA: Insufficient documentation

## 2010-12-02 DIAGNOSIS — N189 Chronic kidney disease, unspecified: Secondary | ICD-10-CM | POA: Insufficient documentation

## 2010-12-02 DIAGNOSIS — K219 Gastro-esophageal reflux disease without esophagitis: Secondary | ICD-10-CM | POA: Insufficient documentation

## 2010-12-02 DIAGNOSIS — Z8639 Personal history of other endocrine, nutritional and metabolic disease: Secondary | ICD-10-CM | POA: Insufficient documentation

## 2010-12-02 DIAGNOSIS — M1990 Unspecified osteoarthritis, unspecified site: Secondary | ICD-10-CM | POA: Insufficient documentation

## 2010-12-03 ENCOUNTER — Ambulatory Visit: Payer: Medicare Other | Admitting: Vascular Surgery

## 2010-12-03 ENCOUNTER — Ambulatory Visit (INDEPENDENT_AMBULATORY_CARE_PROVIDER_SITE_OTHER): Payer: Medicare Other | Admitting: Vascular Surgery

## 2010-12-03 DIAGNOSIS — N186 End stage renal disease: Secondary | ICD-10-CM

## 2010-12-03 NOTE — Assessment & Plan Note (Signed)
OFFICE VISIT  Antonio Page, Antonio Page DOB:  03-Nov-1940                                       12/03/2010 NGEXB#:[id number]  Patient presents today for concern regarding his right hand after a right upper arm AV fistula creation by Dr. Myra Gianotti on November 28, 2010. He presented to the emergency department yesterday evening after dialysis and was having increased pain in his right hand.  According to the emergency room physician's evaluation, he did have normal sensation and did have a normal radial pulse.  He is seen this morning for a check on his fistula.  He does have 1-2+ right radial pulse.  His hand is warm.  He does report having pain in his whole hand yesterday on hemodialysis, which is improved afterwards.  He reports that he is functional with his right hand but does have occasional weakness in this.  This is all new following the procedure.  His incision itself is healing nicely, and he has an excellent thrill.  I discussed this with patient and his wife, explaining that he is having steal symptoms.  This is not severe currently, and I have recommended observation.  He understands that if he has weakness which is progressive or progressive pain, that he in all likelihood will require ligation of his fistula and placement of a new access.  He is being dialyzed currently via catheter.  He will see Dr. Myra Gianotti in 1 week for further evaluation.    Larina Earthly, M.D. Electronically Signed  TFE/MEDQ  D:  12/03/2010  T:  12/03/2010  Job:  [id number]  cc:   Jorge Ny, MD [facility]

## 2010-12-08 NOTE — Op Note (Signed)
  NAME:  BECK, COFER NO.:  [id number]  MEDICAL RECORD NO.:  [id number]           PATIENT TYPE:  O  LOCATION:  [facility]                         FACILITY:  [facility]  PHYSICIAN:  Juleen China IV, MDDATE OF BIRTH:  1941-07-02  DATE OF PROCEDURE:  11/28/2010 DATE OF DISCHARGE:  11/28/2010                              OPERATIVE REPORT   PREOPERATIVE DIAGNOSIS:  End-stage renal disease.  POSTOPERATIVE DIAGNOSIS:  End-stage renal disease.  PROCEDURE PERFORMED:  Right upper arm AV fistula.  ANESTHESIA:  MAC.  COMPLICATIONS:  None.  FINDINGS:  Excellent vein approximately for 4.5 mm, artery was 4 mm.  PROCEDURE IN DETAIL:  The patient was identified in the holding area, taken to room 8, and placed supine on the table.  Ultrasound was used to map the course of cephalic vein in the upper arm.  It appeared to be widely patent, easily compressible, and of adequate diameter as was the artery.  The vein and artery were marked with an ink pen.  Next, the patient was prepped and draped in usual fashion.  Time-out was called. Antibiotics were given.  Lidocaine 1% was used for local anesthesia.  A transverse incision was made at the antecubital crease.  Sharp dissection was used to divide the subcutaneous tissue until I exposed the cephalic vein.  The cephalic vein was circumferentially dissected free and then mobilized as far possible proximally and distally with a mild amount of scar tissue at the antecubital.  The vein however was of adequate diameter.  The vein was marked with an ink pen for proper orientation.  Next, the brachial artery was exposed.  The artery was approximately 4 mm.  There was a mild calcification throughout.  It was circumferentially dissected free and red vessel loops were placed.  The patient was given 3000 units of heparin.  I placed a right angle on the distal vein and then transected it.  There was good backbleeding.  Next, the vein was then  occluded with a Serrefine clamp, the artery was then occluded with a fistula clamp, and a #11 blade was used to make an arteriotomy extended longitudinally with Potts scissors.  The vein was then spatulated and end-to-side anastomosis was created with a running 6- 0 Prolene.  Prior to completion, appropriate flush maneuvers were performed.  Anastomosis was completed.  There was an excellent thrill within the upper arm AV fistula.  The patient had diminished radial pulse, but it was still palpable with the graft, with the fistula patent.  There was an excellent thrill throughout the upper arm fistula up to the shoulder.  I then reversed the patient's heparin.  The deep tissue was closed with 3-0 Vicryl.  The skin was closed with running Monocryl.  Dermabond was placed.  There were no complications.     Jorge Ny, MD     VWB/MEDQ  D:  11/28/2010  T:  11/29/2010  Job:  [id number]  Electronically Signed by Arelia Longest IV MD on 12/08/2010 09:31:01 PM

## 2010-12-10 LAB — CROSSMATCH: Antibody Screen: NEGATIVE

## 2010-12-10 LAB — ABO/RH: ABO/RH(D): O POS

## 2010-12-13 LAB — BASIC METABOLIC PANEL
BUN: 84 mg/dL — ABNORMAL HIGH (ref 6–23)
CO2: 21 mEq/L (ref 19–32)
Chloride: 113 mEq/L — ABNORMAL HIGH (ref 96–112)
GFR calc Af Amer: 16 mL/min — ABNORMAL LOW (ref >60)
Potassium: 4.5 mEq/L (ref 3.5–5.1)

## 2010-12-13 LAB — DIFFERENTIAL
Eosinophils Absolute: 0.3 10*3/uL (ref 0.0–0.7)
Eosinophils Relative: 4 % (ref 0–5)
Lymphs Abs: 1.1 10*3/uL (ref 0.7–4.0)
Monocytes Relative: 7 % (ref 3–12)

## 2010-12-13 LAB — CBC
HCT: 27.8 % — ABNORMAL LOW (ref 39.0–52.0)
MCV: 94.2 fL (ref 78.0–100.0)
RBC: 2.95 MIL/uL — ABNORMAL LOW (ref 4.22–5.81)
WBC: 6.9 10*3/uL (ref 4.0–10.5)

## 2010-12-13 LAB — POCT I-STAT 4, (NA,K, GLUC, HGB,HCT)
Glucose, Bld: 84 mg/dL (ref 70–99)
HCT: 32 % — ABNORMAL LOW (ref 39.0–52.0)
Hemoglobin: 10.9 g/dL — ABNORMAL LOW (ref 13.0–17.0)
Potassium: 4.3 mEq/L (ref 3.5–5.1)
Sodium: 144 mEq/L (ref 135–145)

## 2010-12-13 LAB — WOUND CULTURE

## 2010-12-13 LAB — GLUCOSE, CAPILLARY: Glucose-Capillary: 128 mg/dL — ABNORMAL HIGH (ref 70–99)

## 2010-12-16 ENCOUNTER — Encounter (INDEPENDENT_AMBULATORY_CARE_PROVIDER_SITE_OTHER): Payer: Medicare Other

## 2010-12-16 ENCOUNTER — Ambulatory Visit (INDEPENDENT_AMBULATORY_CARE_PROVIDER_SITE_OTHER): Payer: Medicare Other | Admitting: Surgery

## 2010-12-16 DIAGNOSIS — M79609 Pain in unspecified limb: Secondary | ICD-10-CM

## 2010-12-16 DIAGNOSIS — N186 End stage renal disease: Secondary | ICD-10-CM

## 2010-12-17 NOTE — Assessment & Plan Note (Signed)
OFFICE VISIT  Antonio Page, Antonio Page DOB:  05/15/1941                                       12/16/2010 EAVWU#:[id number]  The patient comes back today for followup.  He had a right upper arm fistula on 11/28/2010.  He has been complaining of some numbness in his right hand that is affecting his ability to button his shirt.  He saw Dr. Arbie Cookey about this on 03/27 who felt that it was a steal syndrome but that it could be managed conservatively at that time.  He comes back in today.  He has not had significant change in his symptoms.  He does have pain with dialysis.  On examination he has excellent grip strength.  I cannot palpate a radial pulse today.  He has an excellent thrill within his fistula.  I did an ultrasound study that showed a velocity of 37 cm at rest in the right radial artery which increased to 75 cm with fistula compression.  I feel that the ultrasound study confirms that this is a steal syndrome. I discussed the options with the patient at this time.  Due to his overall health and age I do not think he is a candidate for a DRIL procedure.  Therefore I think we have 2 options.  #1 is to just ligate the fistula.  However, since his symptoms are not that severe I have recommended trying to band the proximal portion of the fistula to see if this can help with managing his symptoms.  I discussed in detail this procedure especially the fact that this may not improve his symptoms. However, I do not feel like we will lose any ground because if this does not work he will need a ligation.  He understands this.  We have scheduled this for Thursday April 19.  I have also recommended that he keep his blood pressure as high as possible during dialysis.    Jorge Ny, MD Electronically Signed  VWB/MEDQ  D:  12/16/2010  T:  12/17/2010  Job:  [id number]

## 2010-12-26 ENCOUNTER — Ambulatory Visit (HOSPITAL_COMMUNITY)
Admission: RE | Admit: 2010-12-26 | Discharge: 2010-12-26 | Disposition: A | Discharge status: Home / Self Care | Payer: Medicare Other | Source: Ambulatory Visit | Attending: Vascular Surgery | Admitting: Vascular Surgery

## 2010-12-26 DIAGNOSIS — N186 End stage renal disease: Secondary | ICD-10-CM

## 2010-12-26 DIAGNOSIS — Y849 Medical procedure, unspecified as the cause of abnormal reaction of the patient, or of later complication, without mention of misadventure at the time of the procedure: Secondary | ICD-10-CM | POA: Insufficient documentation

## 2010-12-26 DIAGNOSIS — T82898A Other specified complication of vascular prosthetic devices, implants and grafts, initial encounter: Secondary | ICD-10-CM | POA: Insufficient documentation

## 2010-12-26 DIAGNOSIS — I120 Hypertensive chronic kidney disease with stage 5 chronic kidney disease or end stage renal disease: Secondary | ICD-10-CM

## 2010-12-26 LAB — GLUCOSE, CAPILLARY

## 2010-12-26 LAB — POCT I-STAT 4, (NA,K, GLUC, HGB,HCT): Glucose, Bld: 133 mg/dL — ABNORMAL HIGH (ref 70–99)

## 2010-12-30 ENCOUNTER — Ambulatory Visit: Payer: Medicare Other | Admitting: Surgery

## 2010-12-30 NOTE — Op Note (Signed)
  NAME:  Antonio Page, Antonio Page NO.:  [id number]  MEDICAL RECORD NO.:  [id number]           PATIENT TYPE:  O  LOCATION:  SDSC                         FACILITY:  [facility]  PHYSICIAN:  Quita Skye. Hart Rochester, M.D.  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  OPERATION:  Revision of right upper arm brachial artery to cephalic vein arteriovenous fistula using banding procedure with Gore-Tex patch.  SURGEON:  Quita Skye. Hart Rochester, MD  FIRST ASSISTANT:  Della Goo, PA-C  ANESTHESIA:  MAC, local.  PROCEDURE:  The patient was taken to the operating room and placed in supine position at which time right upper extremity was prepped with Betadine scrub and solution and draped in a routine sterile manner. After infiltration of 1% Xylocaine with epinephrine, a transverse incision was made in the antecubital wound where the previous fistula had been created earlier about 3-4 weeks earlier.  The brachial artery to cephalic vein anastomosis was dissected free and the cephalic vein had an excellent pulse and palpable thrill in the fistula.  The vein was exposed out about 3-4 cm.  Using continuous Doppler monitoring of the distal radial and ulnar flow, a Gore-Tex patch was cut approximately 3-4 cm in width and gradually cinched down on the fistula to narrow it to try to maximize arterial flow distally and maintain flow in the fistula. This was achieved and several 6-0 Prolene horizontal mattress sutures were placed in the Gore-Tex patch to gently narrow the fistula.  After this had been accomplished, there was still in good pulse in the fistula in  the mid upper arm with audible Doppler flow and significant improvement in the radial and ulnar flow distally.  Any further cinching of the patch caused the fistula to have no flow.  Following this, adequate hemostasis was achieved.  Wounds were closed in layers with Vicryl in a subcuticular fashion.  Sterile dressing was applied.  The patient was taken to the recovery room in stable condition.     Quita Skye Hart Rochester, M.D.    JDL/MEDQ  D:  12/26/2010  T:  12/27/2010  Job:  [id number]  Electronically Signed by Josephina Gip M.D. on 12/30/2010 10:28:18 AM

## 2011-01-13 ENCOUNTER — Ambulatory Visit: Payer: Medicare Other | Admitting: Surgery

## 2011-01-13 ENCOUNTER — Ambulatory Visit (INDEPENDENT_AMBULATORY_CARE_PROVIDER_SITE_OTHER): Payer: Medicare Other | Admitting: Surgery

## 2011-01-13 DIAGNOSIS — N186 End stage renal disease: Secondary | ICD-10-CM

## 2011-01-13 NOTE — Assessment & Plan Note (Signed)
OFFICE VISIT  Antonio Page, Antonio Page DOB:  [birth year]                                       01/13/2011 UVOZD#:[id number]  The patient comes back today for followup.  He is status post right upper arm AV fistula on 11/28/2010.  He had a mild steal syndrome and therefore underwent banding of his fistula by Dr. Hart Rochester on 12/26/2010. He comes back today for followup.  He states that his arm is better. However, it is not back to baseline.  He still has trouble buttoning his top buttons and it feels cool and does go numb after dialysis.  On examination the patient does have good grip strength.  He has a faint palpable right radial pulse.  There is a good thrill within his fistula.  I had a lengthy discussion today with the patient and his wife.  As it is right now, I do not think that he can tolerate the level of steal that he has just secondary to his ability to use that hand.  He has had some improvement after the banding.  He now has a palpable pulse and he subjectively feels that it is better.  We discussed our options at this point which are limited to fistula ligation with planning for new access on the left side, potentially a basilic vein transposition.  The patient would like to give it 2-3 more weeks.  I have encouraged him to do frequent daily exercises to try to improve blood flow to his hand.  Will see if this improves things and go from there.  I will see him back in 2- 3 weeks.    Jorge Ny, MD Electronically Signed  VWB/MEDQ  D:  01/13/2011  T:  01/13/2011  Job:  [id number]  cc:   [facility]

## 2011-01-21 NOTE — Assessment & Plan Note (Signed)
OFFICE VISIT   AVREY, HYSER  DOB:  October 02, 1940                                       07/04/2009  [id number]#:[id number]   Patient presents today for follow-up of the antecubital wound from his  left upper arm AV fistula creation.  He continues to have nice  maturation of his fistula that was initiated in 05/10/09.  He did have a  stitch abscess in the Vicryl suture at the antecubital, and this is now  completely healed.  He was reassured of this result and will see [place]  again on an as-needed basis.   Larina Earthly, M.D.  Electronically Signed   TFE/MEDQ  D:  07/04/2009  T:  07/05/2009  Job:  [id number]

## 2011-01-21 NOTE — Assessment & Plan Note (Signed)
OFFICE VISIT   Page, Antonio BARKAN  DOB:  October 22, 1940                                       06/20/2009  JXBJY#:[id number]   The patient presents today for continued followup of his left  antecubital wound from his upper arm AV fistula.  I had seen him 2 weeks  ago in the office at which time his wound looked excellent.  He did  subsequently have some separation induration and saw Dr. Darrick Penna in the  emergency department on 10/09 at [facility].  Did a culture of the wound  and started him on Levaquin.  His culture grew Staph aureus which was  multiply sensitive.  He will continue on Levaquin.   Today on physical exam he has what looks like a standard reaction to the  Vicryl subcutaneous stitch.  I debrided this area in the office and was  not able to obtain the stitch, it feels as though it has resorbed.  He  was instructions on local wound care and will see me again in 2 weeks  for final followup.  His fistula continues to mature nicely.   Larina Earthly, M.D.  Electronically Signed   TFE/MEDQ  D:  06/20/2009  T:  06/21/2009  Job:  [id number]

## 2011-01-21 NOTE — Op Note (Signed)
NAME:  Antonio Page, LENGACHER NO.:  [id number]   MEDICAL RECORD NO.:  [id number]          PATIENT TYPE:  AMB   LOCATION:  SDS                          FACILITY:  [facility]   PHYSICIAN:  Larina Earthly, M.D.    DATE OF BIRTH:  12/29/1940   DATE OF PROCEDURE:  05/10/2009  DATE OF DISCHARGE:  05/10/2009                               OPERATIVE REPORT   PREOPERATIVE DIAGNOSIS:  Chronic renal insufficiency.   POSTOPERATIVE DIAGNOSIS:  Chronic renal insufficiency.   PROCEDURE:  Left upper arm AV fistula creation.   SURGEON:  Larina Earthly, MD   ASSISTANT:  Jerold Coombe, [facility]   ANESTHESIA:  LMA.   COMPLICATIONS:  None.   DISPOSITION:  To recovery room, stable.   PROCEDURE IN DETAIL:  The patient was taken to the operating room and  placed in supine position. The area of the left arm was prepped and  draped in usual sterile fashion.  Incision was made over the antecubital  space, carried down to isolate the cephalic vein and brachial artery.  The vein was of moderate size.  Tributary branches were ligated with 3-0  and 4-0 silk ties and divided.  The vein was mobilized to the level of  the brachial artery.  The vein was ligated distally with a 2-0 silk tie.  The vein was divided and brought into approximation with the brachial  artery.  The artery was occluded proximally and distally and was opened  with an 11 blade and extended longitudinally with Potts scissors.  The  vein was spatulated and sewn end-to-side to the artery with a running 6-  0 Prolene suture.  Prior to completion of the anastomosis, the usual  flushing maneuvers were undertaken.  The anastomosis completed and flow  restored to the fistula.  Good thrill was noted.  The wound was  irrigated with saline.  Hemostasis with electrocautery.  Wound was  closed with 3-0 Vicryl in the subcutaneous and subcuticular tissues.  Benzoin and Steri-Strips were applied.      Larina Earthly, M.D.  Electronically  Signed     TFE/MEDQ  D:  05/10/2009  T:  05/11/2009  Job:  [id number]

## 2011-01-21 NOTE — Consult Note (Signed)
NEW PATIENT CONSULTATION   Antonio Page, Antonio Page  DOB:  Jan 15, 1941                                       05/04/2009  ZOXWR#:[id number]   Antonio Page presents today for evaluation for renal access.   He is a very pleasant, 70 year old gentleman, father-in-law of Antonio Page.  He has progressive renal insufficiency and his last  creatinine was 4.7.  He is seen today for discussion of AV access.   PAST HISTORY:  Diabetes, insulin dependent, for many years.  He has  hypertension, elevated cholesterol.   FAMILY HISTORY:  Negative for premature atherosclerotic disease.   SOCIAL HISTORY:  He is married with 4 children.  He quit smoking in  1998.  He does not drink alcohol.   REVIEW OF SYSTEMS:  Weight is 300 pounds.  He is 5 foot 11 inches tall.  He does have shortness of breath with exertion.  He does have CPAP mask.  He has reflux, dizziness, joint pain, nervousness.   MEDICATIONS:  List is attached.   PHYSICAL EXAMINATION:  He is a well nourished, well developed, white  male appearing stated age of 88.  Blood pressure 176/84, pulse 83,  respirations 18, radial pulses 2+ bilaterally.  He does have moderate  size cephalic vein in the antecubital space bilaterally.   He underwent a vein mapping and I reviewed this with Mr. and Mrs.  Page.  This does show patent cephalic vein throughout its course.  However, it got small in the wrist antecubital space and is better  caliber from antecubital space proximally.  He is right-handed.  I have  recommended a left upper arm AV fistula creation and have scheduled this  for September 2nd.  He understands he can have nonmaturation of this.  He is scheduled for surgery on 9/02.   Larina Earthly, M.D.  Electronically Signed   TFE/MEDQ  D:  05/04/2009  T:  05/07/2009  Job:  [id number]   cc:   Zetta Bills, MD  Geoffry Paradise, M.D.

## 2011-01-21 NOTE — Procedures (Signed)
CEPHALIC VEIN MAPPING   INDICATION:  Preop AVF, cephalic vein mapping.   HISTORY:  Chronic kidney disease.   EXAM:  The right cephalic vein is compressible.   Diameter measurements range from 0.19 cm to 0.59 cm, however 0.39 cm to  0.59 cm in the brachium.   The left cephalic vein is compressible.   Diameter measurements range from 0.16 cm to 0.59 cm, however 0.44 cm to  0.59 cm in the brachium.   See attached worksheet for all measurements.   IMPRESSION:  1. Patent bilateral cephalic veins which are of acceptable diameter      for use as a dialysis access site in the brachiums.  2. Cephalic veins appear of small caliber in bilateral forearms.   ___________________________________________  Larina Earthly, M.D.   AS/MEDQ  D:  05/04/2009  T:  05/04/2009  Job:  [id number]

## 2011-01-21 NOTE — Assessment & Plan Note (Signed)
OFFICE VISIT   Antonio Page, Antonio Page  DOB:  November 18, 1940                                       06/08/2009  EAVWU#:[id number]   The patient presents today for followup of his left upper arm AV fistula  creation on 05/10/2009.  He has recently seen Dr. Allena Katz and reports that  his renal function is relatively stable.  There is no felt for immediate  need for hemodialysis.   PHYSICAL EXAM:  His antecubital incision is well-healed.  He does have  an excellent thrill in his upper arm fistula.  The major trunk of the  cephalic vein is dilating nicely.  He does have several tributary  branches arising from this.   I discussed this with the patient and his wife and recommended he  continue his exercise program in his arm and continued observation.  I  do not feel there is any indication to attempt tributary ligation at  this time since he is only 1 month out from his fistula creation and  does have good maturation of the cephalic vein.  I explained that this  would be an option should he have difficulty with maturation over time.  He will continue to follow up with [facility] and see [place]  on an as-needed basis.   Larina Earthly, M.D.  Electronically Signed   TFE/MEDQ  D:  06/08/2009  T:  06/08/2009  Job:  [id number]   cc:   [facility]  Geoffry Paradise, M.D.

## 2011-01-24 NOTE — Op Note (Signed)
NAME:  Antonio Page, Antonio Page NO.:  [id number]   MEDICAL RECORD NO.:  [id number]          PATIENT TYPE:  INP   LOCATION:  5727                         FACILITY:  [facility]   PHYSICIAN:  Jillyn Hidden A. Rankin, M.D.   DATE OF BIRTH:  1941-06-27   DATE OF PROCEDURE:  DATE OF DISCHARGE:  04/23/2006                                 OPERATIVE REPORT   PREOPERATIVE DIAGNOSES:  1. Rhegmatogenous detachment - right eye.  2. 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  3. Neovascular glaucoma of the right eye, secondary to #1.   POSTOPERATIVE DIAGNOSES:  1. Rhegmatogenous detachment - right eye.  2. 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  3. Neovascular glaucoma of the right eye, secondary to #1.  4. Epiretinal membrane - right eye.  5. Posterior synechia.   PROCEDURES:  1. Posterior vitrectomy, membrane peel - right eye.  2. Endolaser j - right eye.  3. Injection of vitreus subcutaneous - permanent - silicone oil 5000      centistokes.  4. Laser posterior synechialysis - right eye in a transcorneal fashion to      iris adhesions to the intraocular lens.   SURGEON:  Alford Highland. Rankin, M.D.   ANESTHESIA:  Local with monitored anesthesia control.   INDICATIONS FOR PROCEDURE:  The patient is a 70 year old man who has a  history of proliferative diabetic retinopathy, who has developed progressive  neovascular glaucoma despite extensive panphotocoagulation, was found to  have a low-lying anterior retinal detachment, anterior to the right eye  equator and anterior to the last rim of laser photocoagulation.  It was felt  that this ischemic retina was contributing to neovascular glaucoma.  The  patient is to have this in attempt to reattach the retina and to deliver  panphotocoagulation.  The patient understands the risks of anesthesia,  including loss to the eye, including but not limited to hemorrhage, scar,  and nuclear surgery, change in vision, loss  in vision, aggressive disease,  and other intervention.  Appropriate signed consent was obtained.   DESCRIPTION OF PROCEDURE:  The patient was taken to the operating room.  In  the operating room, and monitoring is followed by mild sedation.  0.75%  Marcaine delivered in a retrobulbar, additional 5 mL of __________.   The right periocular region was sterilely prepped and draped in the usual  ophthalmic fashion.  Lid speculum applied.  A 25-gauge trocar system used to  place silicone oil.  Superior trocar was applied.  __________  previously  done; however, small retinal hole was made at the 6 o'clock position for  drainage of the subretinal fluid anterior to the equator.   Membrane forceps were then used to engage epiretinal membrane over the  macular region.  Elements and segments of internal limiting membrane were  removed off the macular region to diminish topographic distortion.   At this time, fluid air exchange completed.  Fluid was drained from the  subretinal space.  Retina reattached nicely peripherally.  The retina was  detached entirely anterior to 360 to panphotocoagulation, and just anterior  to the equator.  Also retinal fluids were removed.  This time endolaser  photocoagulation placed in an ablation pattern, as well as in a retinopexy  pattern anterior to the equator.   At this time, all retinal fluid was removed with flexible tip needle.  Thereafter, supranasal trocar removed.  Separate MVR blade incision through  the cornea was made, after conjunctival resection had been developed.  Through this region, silicone oil 5000 centistokes was added passively.  Excellent fill was obtained.  It was noted that some silicone oil did egress  out the supranasal trocar site and for this reason, it was necessary to  perform conjunctival resection in this area, removal of the silicone oil and  then suture closure with 7-0 Vicryl of the supranasal sclerotomy site.  Similarly, the  20-gauge MVR site was then close with 7-0 Vicryl.  The  remaining trocar had also been removed in this area to closed off with 7-0  Vicryl.  At the this time, the infusion was removed.  No suture was  required.  Conjunctiva was closed with 7-0 Vicryl.  Subconjunctival  injection of steroid carried out.  Sterile patch and Fox shield applied to  the right eye.  The patient tolerated the procedure well without  complication.      [name], M.D.  Electronically Signed     GAR/MEDQ  D:  04/23/2006  T:  04/24/2006  Job:  [id number]

## 2011-01-24 NOTE — Consult Note (Signed)
NAME:  Antonio Page, TALL NO.:  [id number]   MEDICAL RECORD NO.:  [id number]          PATIENT TYPE:  OIB   LOCATION:  5727                         FACILITY:  [facility]   PHYSICIAN:  [name]. Eloise Harman, M.D.DATE OF BIRTH:  [birth year]   DATE OF CONSULTATION:  04/22/2006  DATE OF DISCHARGE:                                   CONSULTATION   INDICATION FOR CONSULTATION:  Evaluation of hyperkalemia and acute on  chronic renal insufficiency.   REQUESTING PHYSICIAN:  Dr. Fawn Kirk.   HISTORY OF PRESENT ILLNESS:  The patient is a 70 year old white male with  multiple medical problems, who is noted to have severe hyperkalemia and an  acute increase in his baseline serum creatinine on preoperative laboratories  for planned right eye surgery today.  He feels fine and has his usual dry  cough.  He denies shortness of breath, nausea, chest pain, abdominal pain.  He has been on a [employer] diet over the past month, and has lost  approximately 20 pounds.   PAST MEDICAL HISTORY:  1. Diabetes mellitus, type 2 with retinopathy, neuropathy, and      nephropathy.  2. Chronic renal insufficiency with baseline serum creatinine level      approximately 2-2.5.  3. 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  4. Hypertension.  5. Gastroesophageal reflux disease.  6. Exogenous obesity.  7. Cervical spine degenerative disk disease with a MRI scan, October 2006.  8. Maxillary sinusitis.  9. Severe obstructive sleep apnea on CPAP treatment.  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  11.Gouty arthritis.  12.Paroxysmal atrial flutter.  13.Coronary artery disease with a 1999 cardiac catheterization showing mid      right coronary artery disease.  He was treated with angioplasty and      stent.  14.Colon polyps with last colonoscopy in 2005.   MEDICATIONS PRIOR TO ADMISSION:  1. Novolin 70/30 insulin 20 units twice daily.  2. Furosemide 20 mg 2 tablets daily.  3. Actos 45 mg daily.  4. Lisinopril 10 mg daily.  5. Allopurinol 300 mg daily.  6. Prilosec 20 mg every other day.  7. Aspirin 325 mg every day.  8. Naproxen 220 mg daily p.r.n. pain (a little unsure if he is currently      taking this).  9. Januvia 50 mg daily.   ALLERGIES AND MEDICATION INTOLERANCES:  BYETTA WAS ASSOCIATED WITH  SIGNIFICANT NAUSEA.   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  He has a  history of cigar use that was discontinued several years ago.  He has no  history of alcohol abuse.   FAMILY HISTORY:  Noncontributory.   PAST SURGICAL HISTORY:  1. In 2003 right shoulder arthroscopy.  2. In 2004 hemorrhoids.  3. In 1999 cardiac catheterization with angioplasty of the right coronary      artery.  4. In 1999 mediastinoscopy with biopsy.  5. In 2005, umbilical hernia repair.   REVIEW OF SYSTEMS:  He has chronically decreased vision that is worse in  the  right eye now than the left.  He is a chronic dry cough.  He has had a  intentional weight loss of approximately 20 pounds in the past month.  He  denies chest pain or nausea.  He has bilateral hand arthritis changes and  chronic numbness in both feet   PHYSICAL EXAMINATION:  VITAL SIGNS:  Blood pressure 115/57, pulse 52,  respirations 20, temperature 97.2, pulse oxygen saturation 94% on room air.  GENERAL:  He is a overweight white male with a frequent dry cough, but no  shortness of breath on room air,  HEENT:  Exam was grossly normal.  NECK:  Supple without jugular venous distension or carotid bruit.  CHEST:  Clear to auscultation.  HEART:  Regular rate and rhythm with a systolic ejection murmur of grade  1/6.  ABDOMEN:  Normal bowel sounds and no hepatosplenomegaly or tenderness.  EXTREMITIES:  Without cyanosis, clubbing, or edema.  The pedal pulses were  normal.  There was decreased light touch sensation of both feet.   LABORATORY STUDIES:   White blood cell count 6, hemoglobin 13, hematocrit 39,  platelets 112.  PT 13.9.  Serum sodium 137, potassium 6.5, chloride 105, CO2  of 25, BUN 84, creatinine 4.0, glucose 121.   Past laboratory studies most notable for April 10, 2006.  Hemoglobin A1c  6.7%, and serial BUN and creatinine levels of 72 and 2.2 on May 13, 2005, 48 and 1.8 on March 15, 2004, 46 and 1.8 on March 17, 2003, 48 and 2.3 on  April 13, 2002.   IMPRESSION AND PLAN:  1. It appears that he has acute on chronic renal insufficiency.  This is      most likely due to relative hypotension, possibly due to his recent      weight loss.  His normal blood pressures run in the 125-140 systolic      range.  This could lead to relative hypoperfusion of the kidneys which,      in the face of an ACE inhibitor treatment and some use of nonsteroidal      anti-inflammatory drugs, could worsen his renal function.  I think it      is less likely that this is due to obstructive uropathy as he denies      symptoms consistent with severe BPH.  I plan to discontinue lisinopril      and recommended he have no further nonsteroidal anti-inflammatory      drugs.  Overnight he will be given gentle hydration with IV fluids and      Kayexalate to bring his potassium level in a more acceptable range.  We      will recheck a basic metabolic panel in the a.m.  2. Coronary artery disease:  Stable on his current medical regimen.  3. History of gout stable, however his current dose of allopurinol is      somewhat high, given his serum creatinine      level, so his allopurinol dose will be decreased to 100 mg daily.  4. Diabetes mellitus, type 2:  Currently under excellent control with his      diet, weight loss, and current medical regimen.           ______________________________  [name]. Eloise Harman, M.D.     DGP/MEDQ  D:  04/22/2006  T:  04/22/2006  Job:  [id number]   cc:   Geoffry Paradise, M.D. Nanetta Batty, M.D.  Barbaraann Share,  MD,FCCP  James L. Malon Kindle., M.D.

## 2011-01-24 NOTE — H&P (Signed)
[place]. [facility]  Patient:    Antonio Page, Antonio Page                     MRN: [id number] Adm. Date:  [id number] Disc. Date: [id number] Attending:  Ivor Messier CC:         Richard A. Jacky Kindle, M.D.   History and Physical  REASON FOR ADMISSION:  This was a planned outpatient surgical admission of this 70 year old white male admitted for cataract implant surgery of the right eye.  HISTORY OF PRESENT ILLNESS:  This patient was first seen in my office on April 27, 2000.  The patient, at that time, stated that he had previous cataract implant surgery by Dr. Ernesto Rutherford 1-1/2 years previously and had been followed by Dr. Fawn Kirk for possible diabetic retinopathy. The patient had multiple laser photocoagulation sessions over the past eight to ten years by Dr. Luciana Axe.  The patient was complaining of decreased vision in both eyes.  PAST MEDICAL HISTORY:  The patient was under the care of Dr. Geoffry Paradise, (please send Dr. Jacky Kindle copies of these notes and any associated laboratory data, EKG or chest x-ray material), for diabetes mellitus since 1980, sarcoidosis and gastric reflux syndrome.  CURRENT MEDICATIONS:  Included Lasix, Actos, Protonix, Aleve, laroxyl, and 70/30 insulin plus Glucophage.  REVIEW OF SYSTEMS:  No cardiorespiratory complaints.  PHYSICAL EXAMINATION:  GENERAL:  The patient is a pleasant, well-developed, well-nourished, 70 year old white male in no acute distress with decreased vision.  HEENT:  Eyes:  Visual acuity less than 20/400 right eye and 20/80 left eye.  Slit lamp examination revealed nuclear cataract formation right eye and a posterior chamber intraocular lens implant.  Fundus examination revealed nuclear cataract formation in the right eye.  The vitreous appeared clear.  The retina attached with old pan retinal laser photocoagulation for proliferative diabetic retinopathy.  No active retinopathy was seen.  Left  eye:  Clear vitreous, attached retina with old proliferative diabetic retinopathy felt to be inactive.  Extensive pan retinal laser photocoagulation was present.  VITAL SIGNS:  Blood pressure 153/64, heart rate 65, respirations 18, temperature 97.6.  CHEST:  Lungs clear to percussion and auscultation.  HEART:  Normal sinus rhythm.  No cardiomegaly.  No murmurs.  ABDOMEN:  Negative.  EXTREMITIES:  Negative.  ASSESSMENT:  It was felt this patient had significant cataract implant surgery and had significant cataract formation of the right eye which warranted cataract surgery.  The patient was given oral discussion and printed information concerning the procedure and its possible complications.  He signed an informed consent and arrangements were made for his outpatient admission at this time.  ADMISSION DIAGNOSES:  1.  Senile cataract, right eye.  2.  Advanced proliferative diabetic retinopathy, both eyes, secondary to diabetes mellitus. 3.  Pseudophakia, left eye.  SURGICAL PLAN:  Cataract implant surgery, right eye. REASON FOR ADMISSION:  This was a planned outpatient surgical admission of this 70 year old white male admitted for cataract implant surgery of the right eye.  HISTORY OF PRESENT ILLNESS:  This patient was first seen in my office on April 27, 2000.  The patient, at that time, stated that he had previous cataract implant surgery by Dr. Ernesto Rutherford 1-1/2 years previously and had been followed by Dr. Fawn Kirk for possible diabetic retinopathy.  The patient had multiple laser photocoagulation sessions over the past eight to ten years by Dr. Luciana Axe.  The patient was complaining of decreased vision in both eyes.  PAST MEDICAL HISTORY:  The patient was under the care of Dr. Geoffry Paradise, (please send Dr. Jacky Kindle copies of these notes and any associated laboratory data, EKG or chest x-ray material), for diabetes mellitus since 1980, sarcoidosis and gastric reflux  syndrome.  CURRENT MEDICATIONS:  Included Lasix, Actos, Protonix, Aleve, laroxyl, and 70/30 insulin plus Glucophage.  REVIEW OF SYSTEMS:  No cardiorespiratory complaints.  PHYSICAL EXAMINATION:  GENERAL:  The patient is a pleasant, well-developed, well-nourished, 70 year old white male in no acute distress with decreased vision.  HEENT:  Eyes:  Visual acuity less than 20/400 right eye and 20/80 left eye. Slit lamp examination revealed nuclear cataract formation right eye and a posterior chamber intraocular lens implant.  Fundus examination revealed nuclear cataract formation in the right eye.  The vitreous appeared clear. The retina attached with old pan retinal laser photocoagulation for proliferative diabetic retinopathy.  No active retinopathy was seen.  Left eye:  Clear vitreous, attached retina with old proliferative diabetic retinopathy felt to be inactive.  Extensive pan retinal laser photocoagulation was present.  VITAL SIGNS:  Blood pressure 153/64, heart rate 65, respirations 18, temperature 97.6.  CHEST:  Lungs clear to percussion and auscultation.  HEART:  Normal sinus rhythm.  No cardiomegaly.  No murmurs.  ABDOMEN:  Negative.  EXTREMITIES:  Negative.  ASSESSMENT:  It was felt this patient had significant cataract implant surgery and had significant cataract formation of the right eye which warranted cataract surgery.  The patient was given oral discussion and printed information concerning the procedure and its possible complications.  He signed an informed consent and arrangements were made for his outpatient admission at this time.  ADMISSION DIAGNOSES:  1.  Senile cataract, right eye.  2.  Advanced proliferative diabetic retinopathy, both eyes, secondary to diabetes mellitus. 3.  Pseudophakia, left eye.  SURGICAL PLAN:  Cataract implant surgery, right eye.  PREOPERATIVE DIAGNOSIS:  Nuclear cataract right eye.  POSTOPERATIVE DIAGNOSIS:  Nuclear cataract  right eye.   NAME OF OPERATION:  Planned extracapsular cataract extraction with primary insertion of posterior chamber intraocular lens implant.  SURGEON:  Guadelupe Sabin, M.D.  ASSISTANT:  Nurse  ANESTHESIA:  Local, 4% Xylocaine, 0.75 Marcaine, retrobulbar block with anesthesia standby.  Topical tetracaine and intraocular Xylocaine were also used.  OPERATIVE PROCEDURE:  After the patient was prepped and draped, a lid speculum was inserted in the right eye.  The eye was turned downward and a superior rectus traction suture placed.  Shiotz tonometry was recorded at 8 to 10 scale units with a 5.5 gm weight.  A peritomy was performed adjacent to the limbus from the 11 to 1 oclock position.  The corneal/scleral junction was cleaned a corneal/scleral groove made with a 45-degree super blade.  The anterior chamber was then entered with a 2.5 mm diamond keratome at the 12 oclock position and the 15-degree blade at the 2:30 position.  Using a bent #26 gauge needle on a Healon syringe, a circular capsulorrhexis was begun and then completed with the ______ forceps.  Hydrodelineation and hydrodissection were performed using 1% Xylocaine.  The 30-degree phacoemulsification tip was then inserted with slow emulsification of the lens nucleus.  The residual cortex was aspirated with the irrigation-aspiration tip.  The posterior capsule appeared intact with a brilliant red reflex and it was therefore elected to insert an ______ [employer]ics SI40 and the silicone three-piece posterior chamber intraocular lens implant with ______ absorber, diopter strength +22.50.  This was inserted with the McDonald forceps into  the anterior chamber and then centered into the capsular bag using the ______ lens rotator.  The lens appeared to be well centered.  The Healon which had been used during the procedure was aspirated and replaced with balanced salt solution and Miochol ophthalmic solution.  The operative  incisions appeared to be self-sealing and no sutures were required.  Maxitrol ointment was then instilled in the conjunctival cul-de-sac and a light patch and protector shield applied. Duration of procedure was 45 minutes.  The patient tolerated the procedure well, and in general, and left the operating room for the recovery room in good condition. DD:  05/23/00 TD:  05/23/00 Job: [id number]

## 2011-01-24 NOTE — Op Note (Signed)
[place]. [facility]  Patient:    Antonio Page, Antonio Page Visit Number: [id number] MRN: [id number]          Service Type: DSU Location: [facility] Attending Physician:  Susa Day Dictated by:   Katy Fitch Naaman Plummer., M.D. Proc. Date: 11/30/01 Admit Date:  11/30/2001   CC:         Richard A. Jacky Kindle, M.D.   Operative Report  PREOPERATIVE DIAGNOSIS: 1. Progressive Dupuytrens contracture, left palm involving pretendinous    fibers to the long, ring and small fingers. 2. Complex triggering, left long finger with marked swelling of flexor    digitorum superficialis tendons leading to triggering deep to A-1 and    A-2 pulleys.  POSTOPERATIVE DIAGNOSIS: 1. Progressive Dupuytrens contracture, left palm involving pretendinous    fibers to the long, ring and small fingers. 2. Complex triggering, left long finger with marked swelling of flexor    digitorum superficialis tendons leading to triggering deep to A-1 and    A-2 pulleys.  OPERATION PERFORMED: 1. Excision of Dupuytrens contracture, right palm involving pretendinous    fibers to long, ring and small fingers. 2. Release of A-1 pulley followed by resection of ulnar slip of flexor    digitorum superficialis to decompress flexor system through A-2 pulley,    left long finger.  SURGEON:  Katy Fitch. Sypher, Montez Hageman., M.D.  ASSISTANT:  Jonni Sanger, P.A.  ANESTHESIA:  Axillary block.  SUPERVISING ANESTHESIOLOGIST:  Dr. Krista Blue.  INDICATIONS FOR PROCEDURE:  The patient is a 70 year old man with type 2 diabetes.  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  He requested management of his triggering and palmar  fibromatosis.  After informed consent we recommended proceeding with release of the A-1 pulley and possible tendon resection to relieve the triggering followed by excision of Dupuytrens contracture.  Preoperatively, he was advised that palmar fibromatosis is universally recurrent and his scarring following surgery may be problematic for a period of time.  DESCRIPTION OF PROCEDURE:  Antonio Page was brought to the operating room and placed in supine position on the operating table.  Following placement of an axillary block in the holding area, anesthesia was satisfactory in the left arm.  The arm was prepped with Betadine soap and solution and sterilely draped.  Following exsanguination of the limb with an Esmarch bandage, the arterial tourniquet on the proximal brachium was inflated to 260 mmHg due to mild systolic hypertension.  The procedure commenced with the planning of an oblique incision extending overlying the A-1 pulley of the long finger and following the distal palmar crease past the ring finger A-1 pulley.  The subcutaneous tissues were carefully divided taking care to gently dissect the pretendinous fibers of the palmar fascia off the deep surface of the dermis.  The neurovascular structures were identified at the level of the common digital arteries and nerves as well as the lumbrical muscles.  The palmar fibromatosis was meticulously resected from the long, ring and small finger pretendinous fibers.  This was passed off as a pathologic specimen.  This relieved the skin contracture and the MP flexion contracture.  The A-1 pulley of the long finger was isolated and split with scissors.  Thereafter significant residual triggering of the flexor system continued deep to the A-2 pulley due to severe swelling of the superficialis tendon.  Therefore an oblique incision was fashioned over the proximal phalangeal segment of the finger and a portion of the C-1 pulley was released  on the ulnar aspect of the flexor sheath.  The ulnar insertion of the superficialis tendon was identified and released with scissors followed by flexion of the PIP joint and traction on the superficialis delivering the superficialis tendon proximally.  This was then released in an oblique manner so as to create a smooth margin at the site of the resection proximally.  Thereafter, full range of motion of the PIP and DIP joint of the long finger was recovered without residual triggering.  The A-2 pulley was left undisturbed as were the remainder of the retinacular system.  The wound was inspected for bleeding points which were electrocauterized with bipolar cautery followed by repair of the skin with intradermal 3-0 Prolene. A voluminous gauze dressing was applied with Xeroflo, sterile gauze, acrylic fluff and a volar plaster splint maintaining the wrist in 5 degrees dorsiflexion.  For aftercare, Antonio Page will begin immediate active range of motion exercises.  He will return to our office in seven days for suture removal and initiation of a therapy program. Dictated by:   Katy Fitch. Naaman Plummer., M.D. Attending Physician:  Susa Day DD:  11/30/01 TD:  11/30/01 Job: [phone] [id number]

## 2011-01-24 NOTE — Op Note (Signed)
NAME:  Antonio Page, TEUTSCH NO.:  [id number]   MEDICAL RECORD NO.:  [id number]          PATIENT TYPE:  AMB   LOCATION:  SDS                          FACILITY:  [facility]   PHYSICIAN:  Jillyn Hidden A. Page, M.D.   DATE OF BIRTH:  Jan 10, 1941   DATE OF PROCEDURE:  08/28/2006  DATE OF DISCHARGE:  08/28/2006                               OPERATIVE REPORT   PREOPERATIVE DIAGNOSES:  1. Neovascular glaucoma, right eye.  2. Rubeosis, right eye.   POSTOPERATIVE DIAGNOSES:  1. Neovascular glaucoma, right eye.  2. Rubeosis, right eye.  3. Anterior peripheral retinal detachment inferiorly most likely      contributing the progressive rubeosis.  4. History of quiescent proliferative diabetic retinopathy and      tractional detachment.   PROCEDURE:  1. Posterior vitrectomy - membrane peel - retinectomy right eye      inferiorly to the chronically detached retina.  2. Endolaser pan-photocoagulation right eye for hemostasis purposes.  3. Insertion of glaucoma - aqueous shunt - Baerveldt anterior chamber,      right eye.   SURGEON:  Antonio Page, M.D.   ANESTHESIA:  Local retrobulbar with monitored anesthesia control.   INDICATIONS FOR PROCEDURE:  The patient is a 70 year old man who has  progressive rubeosis, severe pain and significant signs of ocular  ischemia in the right eye, in the posterior pole and there is a  peripheral retinal thickening inferiorly, but it is unclear as to the  nature of the progressive rubeosis since the patient has had near  confluent pan-photocoagulation.  Nonetheless this is an attempt to  provide the patient again with a vitrectomy, clear the moderate vitreous  hemorrhage and vitreous debris, assess the peripheral retina and  determine if in fact there is a need for reinstallation of silicone oil.  The patient understands the risks of anesthesia including the risk of  death and loss of the eye and including but not limited to hemorrhage,  infection,  scarring, need for further surgery, no change in vision, loss  of vision, progression of disease despite intervention.  Appropriate  signed consent was obtained.  Patient was taken to the operating room.   PROCEDURE:  In the operating room, appropriate monitors followed by mild  sedation, 0.75% Marcaine delivered 5 mL retrobulbar, and additional 5 mL  laterally in the fashion of modified VanLint.  The right periocular  region was sterilely prepped and draped in the usual opthalmic fashion.  The lid speculum was applied.  Conjunctival peritomy was then fashioned  in the superotemporal quadrant.  The lateral and superior rectus muscles  were isolated on 2-0 silk ties.  The patient also understood the need  for the glaucoma shunt preoperatively.  At this time a 25 gauge cannula  with trocar was placed in the anterior chamber through a paracentesis  style incision for the infusion because of the hyphema and the debris in  the anterior chamber, which would hamper visualization posteriorly.  At  this time the superior trocars were applied on the superotemporal  through the bare sclera.  For pain control additional Xylocaine was  placed in the superonasal quadrant because the patient was complaining  of severe tenderness.   At this time core vitrectomy was begun.  Previous vitrectomy had been  done, so a vitreous washout was essentially done.  With scleral  depression, it was found that the patient had a peripheral retinal  detachment inferiorly anterior to the equator, anterior to the laser  photocoagulation previously applied.  For this reason the decision was  made for this atrophic retina to go ahead and excise this area so as to  diminish the release of factors leading to the progressive  neovascularization and elevated intraocular pressure.  Retinectomy was  then carried out for the inferior 3.5 clock hours in this fashion.  No  further retinopexy was required.  Endolaser photocoagulation  was placed  to visible sites of bleeding after the retinectomy excision.  No further  visible elevated retina was identified.  At this time preretinal  hemorrhage was aspirated.  Hemostasis was spontaneous.   At this time attention was turned to performance and placement of the  aqueous shunt.  The superior trocars were removed.  A decision was made  to place the anterior chamber shunt only because of the possibility of  one day putting oil in this eye should it be necessary.  A Baerveldt  style [id number] was placed in the superotemporal  quadrants and the  foot plate was secured with 8-0 nylon sutures to the sclera.  The tube  was irrigated with BSS to prime it.  A paracentesis incision made  through a scleral tunnel with a 20-gauge MVR blade.  Shelved and beveled  tube trimming was carried out and the tube was placed into the anterior  chamber without difficulty.  Ligature with 7-0 Vicryl was applied gently  to diminish flow above the level of 30 mm, as controlled with the  anterior chamber perfusion.  At this time a horizontal mattress stitch  was gently placed with 8-0 nylon overlying the tube to prevent its  spontaneous egress out of the anterior chamber.  At this time a  Tutoplast patch graft was then placed with vertical mattress 7-0 Vicryl.  The tube was in good position.  At this time the conjunctiva and Tenon's  were then irrigated with bug juice and the conjunctiva and Tenon's were  closed in layers with 7-0 Vicryl suture.  Sterile patch and Fox shield  applied after some conjunctival Decadron applied infertility.  The  patient tolerated the procedure well without complication.      Antonio Page, M.D.  Electronically Signed     GAR/MEDQ  D:  08/28/2006  T:  08/29/2006  Job:  [id number]

## 2011-01-24 NOTE — Op Note (Signed)
NAME:  Antonio Page, Antonio Page NO.:  [id number]   MEDICAL RECORD NO.:  [id number]          PATIENT TYPE:  AMB   LOCATION:  SDS                          FACILITY:  [facility]   PHYSICIAN:  Jillyn Hidden A. Rankin, M.D.   DATE OF BIRTH:  1941/01/18   DATE OF PROCEDURE:  DATE OF DISCHARGE:  08/17/2006                               OPERATIVE REPORT   PREOPERATIVE DIAGNOSES:  1. History of combined tractional and rhegmatogenous detachment, right      eye.  2. Previous proliferative diabetic retinopathy, right eye, treated.  3. Retained silicone oil implant, posterior.   POSTOPERATIVE DIAGNOSES:  1. History of combined tractional and rhegmatogenous detachment, right      eye.  2. Previous proliferative diabetic retinopathy, right eye, treated.  3. Retained silicone oil implant, posterior.   PROCEDURES PERFORMED:  1. Posterior vitrectomy with endolaser photocoagulation, right eye.  2. Removal of silicone oil, posterior implant, right eye.  3. Surgical posterior capsulotomy, right eye, and surface polishing of      the posterior surface of the intraocular lens with      phacofragmentation to remove retained oil.   SURGEON:  Alford Highland. Rankin, M.D.   ANESTHESIA:  Local with monitoring anesthesia control.   INDICATIONS FOR PROCEDURE:  The patient is a 70 year old man who has  profound vision loss on the basis advanced diabetic retinopathy.  This  is an attempt to remove the silicone wall after successful retinal  reattachment and repair.  The patient understands the risk of  anesthesia, including the rare occurrence of death, loss of the eye,  including but not limited to hemorrhage, infection, scarring, need for  further surgery, no change in vision, loss of vision, and progressive  disease despite intervention.  Appropriate signed consent was obtained.   DESCRIPTION OF PROCEDURE:  The patient was taken to the operating room.  In the operating room, appropriate monitoring was followed  by mild  sedation.  0.75% Marcaine 25 mL retrobulbar with additional 5 mL of  __________  .  The right periocular region was sterilely prepped and  draped in the usual ophthalmic fashion.  A lid speculum was applied.  A  25-gauge trocar system placed in the inferior temporal quadrant.  Placement of the superior trocar was applied.  Conjunctival openings  were then made at approximately the 11 o'clock position for subsequent  placement of a 20-gauge silicone oil extraction unit.  At this time,  silicone oil extraction was then carried out with active suction.  It  was noted that the apparent intraocular lens was made of silicone.  There was clear attachment of retained oil to the posterior surface of  the intraocular lens.  After all oil had been removed, endolaser  photocoagulation was placed posteriorly along the posterior arcades for  retinopexy purposes, as well as to keep the diabetic retinopathy  quiescent.  At this time, phacofragmentation was then carried out and  surgical posterior capsulotomy enlarged, so as to remove the oil  attached to the posterior surface of the intraocular lens.  Phacofragmentation was carried out this without difficulty.  Retroillumination technique  was then used to confirm that all of the oil  was removed from the lens surface.  At this time, the sclerotomy was  then closed with 7-0 Vicryl.  The conjunctiva was closed with 7-0 Vicryl.  The infusion ports were  removed after the previous superior trocars had also been removed.  Subconjunctival injection of steroid applied.  Sterile packs and Fox  shield applied.  The patient was taken to short stay to be discharged  home as an outpatient.      [name], M.D.  Electronically Signed     GAR/MEDQ  D:  08/17/2006  T:  08/18/2006  Job:  [id number]

## 2011-01-24 NOTE — Op Note (Signed)
NAME:  Antonio Page, Antonio Page NO.:  [id number]   MEDICAL RECORD NO.:  [id number]          PATIENT TYPE:  AMB   LOCATION:  SDS                          FACILITY:  [facility]   PHYSICIAN:  Jillyn Hidden A. Rankin, M.D.   DATE OF BIRTH:  12-Aug-1941   DATE OF PROCEDURE:  09/14/2006  DATE OF DISCHARGE:  09/14/2006                               OPERATIVE REPORT   PREOPERATIVE DIAGNOSES:  1. Neovascular glaucoma, right eye (OD), progressive.  2. 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   POSTOPERATIVE DIAGNOSES:  1. Neovascular glaucoma, right eye (OD), progressive.  2. 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  3. Partial dislocation of the posterior implant with partial extrusion      of the tube from the anterior chamber.   PROCEDURES:  1. Removal of posterior implant, right eye (OD), Baerveldt implant.  2. Insertion of second Baerveldt implant, BG103-250 style, in the      supertemporal quadrant of the right eye.  3. Sector iridectomy for potential causation of internal closure of      tube implant.   SURGEON:  Alford Highland. Rankin, M.D.   ANESTHESIA:  General endotracheal anesthesia.   INDICATIONS FOR PROCEDURES:  The patient is a 70 year old man, who has  neovascular glaucoma on the basis of progressive proliferative diabetic  retinopathy, iris rhytidosis, and moderate dispersed vitreous  hemorrhage.  This is an attempt to improve the neovascular glaucoma by  drainage, checking of the internal aspect of the glaucoma shunt that had  been previously placed.  The patient understands the risks of  anesthesia, including the rare occurrence of death, but also to the eye,  including but not limited to from the condition as well as surgical  intervention, hemorrhage, infection, scarring, need for further surgery,  no change in vision, loss of vision, progression of disease despite  intervention.  Appropriate signed consent was obtained and the  patient  was taken to the operating room.   DESCRIPTION OF PROCEDURES:  In the operating room, appropriate  monitoring was followed by mild sedation.  Marcaine 0.75% was delivered,  5 cc retrobulbar, followed by additional 5 cc laterally in the fashion  of a modified [employer]  The right periocular region was sterilely  prepped and draped in the usual sterile ophthalmic fashion.  A lid  speculum was applied.  A conjunctival peritomy was then fashioned  superior nasally over to the inferotemporal quadrant.  Previous  cicatricial changes in the supertemporal quadrant were identified, and  these were dissected so as to free overlying the previous Tutoplast.  The patient's lateral rectus muscles were isolated after cutdown over  the scar formation over the footplate of the previously placed glaucoma  shunt valve.  This allowed for isolation of these 2 rectus muscles on 2-  0 silk ties.  Excellent mobilization of the eye was obtained in this  fashion.  At this time, the decision was made to expose the tube shunt  identified by occlusions.  It was found to be at this time that the tube  had been retracted just  to the edge of the wound, and there appeared to  be some tenting of the iris in this area that suggested there was some  iris occlusion.  At this time, the tube was removed from the anterior  chamber.  The anterior chamber paracentesis opening the tube passed  through was then enlarged with an MVR blade.  Sector iridectomy was  attempted.  This did not appear, however, to allow for the continued  flow through the tube.  It must also be noted that an infusion cannula  had been placed in the inferotemporal quadrant to maintain the globe  pressure.  This had been carried out before dissection of the tube shunt  manipulations.   At this time, careful observation confirmed that the tube and the  footplate may have shifted posteriorly, thus causing the tube to be too  short.  For this  reason, the decision was made in the supertemporal  quadrant to explant the glaucoma shunt.  This was explanted without  difficulty.  A Baerveldt BG103-250 shunt was then placed in the  supertemporal quadrant without difficulty, and it was secured with 8-0  nylon suture to the sclera.  At this time, the tube was again placed  into the anterior chamber, nearing the visual axis.   Nylon 8-0 was then placed in a horizontal mattress over approximately 4  mm from the tube entry into the limbus to prevent the tube from  retracting.  At this time, a 7-0 Vicryl suture was then secured to the  external spacer of the tube to control the pressure, to release fluid at  over 30 mm.  At this time, it must be noted that a sector iridectomy had  also been performed in that supertemporal quadrant to prevent an  internal occlusion from this region.   Excellent clearing of the posterior pole and the vitreous cavity were  confirmed; and, thus, the decision was made not to proceed with further  vitrectomy because of previous extensive panretinal photocoagulation,  which had already been applied to this eye.   At this time, the decision was made after a careful examination of the  posterior pole to not proceed with vitrectomy, laser, or with placement  of Silicone oil.  I was hopeful that the anterior chamber will remain  clear with the tube shunt placed into position and functioning.  At this  time, the Tutoplast that had been previously excised during the  dissection was then placed near the entry site of the tube into the  limbus.  This was secured with vertical 7-0 Vicryl sutures.  At this  time, the conjunctiva was then brought forward and closed in interrupted  layers with vertical mattress sutures of 7-0 Vicryl at the limbus, and  also peripherally.   At this time subconjunctival injections of Ancef and Decadron were applied.  A sterile patch and a Fox shield were applied.  The patient  tolerated  the procedure without complication.      [name], M.D.  Electronically Signed     GAR/MEDQ  D:  09/14/2006  T:  09/15/2006  Job:  [id number]

## 2011-01-24 NOTE — Op Note (Signed)
NAME:  Antonio Page, Antonio Page NO.:  [id number]   MEDICAL RECORD NO.:  [id number]          PATIENT TYPE:  AMB   LOCATION:  SDS                          FACILITY:  [facility]   PHYSICIAN:  Jillyn Hidden A. Rankin, M.D.   DATE OF BIRTH:  [birth year]   DATE OF PROCEDURE:  08/12/2005  DATE OF DISCHARGE:                                 OPERATIVE REPORT   PREOPERATIVE DIAGNOSES:  1.  Epiretinal membrane, oculi dexter with,  2.  Secondary cystoid macular edema, oculi dexter.  3.  Progressive proliferative diabetic retinopathy, oculi dexter.   POSTOPERATIVE DIAGNOSIS:  1.  Epiretinal membrane, oculi dexter with,  2.  Secondary cystoid macular edema, oculi dexter.  3.  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   OPERATION PERFORMED:  1.  Posterior vitrectomy with membrane peel, oculi dexter.  2.  Endolaser panphotocoagulation, oculi dexter, for repair of retinal      detachment.  3.  Injection of vitreous substitute, SF6, 10%.   SURGEON:  Alford Highland. Rankin, M.D.   ANESTHESIA:  Local retrobulbar anesthesia control.   INDICATIONS FOR SURGERY:  The patient is a 69 year old man who has profound  progressive vision loss and macular edema on the basis of epiretinal  membrane inducing tractional detachment on the macular region, but also  peripheral retinal detachment change on the macular region, but also  peripheral retinal detachment in the right eye,  this is an attempt to  release the tractional changes as well as to repair the retinal detachment.   The patient understands the risks of anesthesia including the rare  occurrence of death, as well as to the eye including, but not limited  hemorrhage, infection, scarring, need for further surgery, no change in  vision, loss of vision, and progressive disease despite intervention.  Appropriate signed consent was obtained.   DESCRIPTION OF THE OPERATION:  The patient was brought to the operating  room.   In the operating room appropriate monitors followed by mild sedations  were placed.  Then 0.75% Marcaine was delivered at 5 mL retrobulbarly and an  additional 5 mL given in the fashion of modified [employer]arel Hong.  The right  periocular region was sterilely  prepped and draped in the usual ophthalmic  fashion.  A lid speculum was applied.  __________  gauge was checked  __________  and 25-gauge trocar system to secure the infusion.  The infusion  was turned on.  Superior trocars were place.   Core vitrectomy was begun although this had been previously there and there  was no core vitreous left.  The vitreous skirt was trimmed 360 degrees.  Scleral depression was then used to trim the vitreous skirt further.  Peripheral retinal detachment was identified extending from the 1 o'clock  position and extending in a temporal fashion down to the 9 o'clock position.  This was all anterior to the previous panretinal photocoagulation.  There  was a peripheral retinal detachment.  The vitreous skirt was trimmed in this  area.  Retinotomy was fashioned so as to allow for drainage of  subretinal  fluid.   At this time the 25-gauge membrane forceps was then used to engage the  epiretinal membrane overall in the macular region.  Significant traction of  the macular region was released in this fashion.  This was peeled off the  entire macular region.   At this time fluid-air exchange was then performed, which reattached  peripheral retinal detachment to flatten the macular region.  At this time  laser photocoagulation was placed in the bed of the detachment and was  successful at reattaching the peripheral retina.  At this time an air SF6,  10% exchange was then completed.  The superior trocars were removed.  The  infusion was removed and excellent patency of the wounds was performed.  Subconjunctival injection of Decadron was applied.   At this time the patient had a sterile patch and Fox shield applied.    The patient tolerated the procedure without complications and was taken to  the short stay to be discharged home as an outpatient.      [name], M.D.  Electronically Signed     GAR/MEDQ  D:  08/12/2005  T:  08/13/2005  Job:  [id number]

## 2011-01-24 NOTE — Op Note (Signed)
NAMEHUBBERT, LANDRIGAN NO.:  [id number]   MEDICAL RECORD NO.:  [id number]          PATIENT TYPE:  OIB   LOCATION:  2899                         FACILITY:  [facility]   PHYSICIAN:  Velora Heckler, MD      DATE OF BIRTH:  10/18/1940   DATE OF PROCEDURE:  06/21/2004  DATE OF DISCHARGE:                                 OPERATIVE REPORT   PREOPERATIVE DIAGNOSIS:  Incarcerated umbilical hernia.   POSTOPERATIVE DIAGNOSIS:  Same.   PROCEDURE:  Repair incarcerated umbilical hernia with Prolene mesh.   SURGEON:  Velora Heckler, M.D.   ANESTHESIA:  General.   ESTIMATED BLOOD LOSS:  Minimal.   PREPARATION:  Betadine.   COMPLICATIONS:  None.   INDICATIONS:  The patient is a 70 year old white male seen at the request of  Dr. Jacky Kindle for umbilical hernia repair.  The patient has had an umbilical  hernia present for at least 10 years.  It has gradually increased in size  over the past one to two years.  He has had no signs of obstruction.  He now  comes to surgery for repair.   BODY OF REPORT:  The procedure was done in O.R. #16 at the [facility]. [facility].  The patient was brought to the operating room and placed  in a supine position on the operating room table.  Following the  administration of general anesthesia, the patient was prepped and draped in  the usual strict aseptic fashion.  After ascertaining that an adequate level  of anesthesia had been obtained, an infraumbilical incision was made with a  #15 blade.  Dissection was carried down through subcutaneous tissues to the  fascia.  Fascial defect was identified.  Hernia sac was opened.  It  contained incarcerated omentum.  This was carefully dissected away from the  overlying skin which was quite thin.  There was little subcutaneous tissue  remaining at the level of the umbilicus.  Hernia was complex, extending into  multiple subcutaneous pockets, all of which are opened and the omentum  excised.  It was  reduced back within the peritoneal cavity.  The fascial  plane was developed circumferentially.  The fascia was closed transversely  with interrupted 0 Ethibond sutures.  Good approximation of the fascia was  obtained without undue tension.  Next, a fascial plane was further developed  to allow placement of a mesh onlay.  A 3 x 6 sheet of mesh was then trimmed  to the appropriate dimensions.  It was placed on the abdominal wall.  It was  secured circumferentially with interrupted 0 Ethibond sutures.  Good  hemostasis was noted.  Umbilicus was reaffixed to the abdominal wall with  interrupted 3-0 Vicryl sutures.  Subcutaneous tissues were closed with  interrupted 3-0 Vicryl  sutures.  Skin was closed with a running 4-0 Vicryl subcuticular suture.  Wound was washed and dried, and Benzoin and Steri-Strips were applied.  Sterile dressings were applied.  The patient was awakened from anesthesia  and brought to the recovery room in stable condition.  The patient tolerated  the  procedure well.      Todd   TMG/MEDQ  D:  06/21/2004  T:  06/21/2004  Job:  [id number]   cc:   Geoffry Paradise, M.D.  [address]  [place]  [place] [address]  Fax: [phone]   Velora Heckler, MD  [address] [place]  [place] [address]  Fax: [phone]

## 2011-01-24 NOTE — Op Note (Signed)
NAME:  Antonio Page, Antonio Page NO.:  [id number]   MEDICAL RECORD NO.:  [id number]          PATIENT TYPE:  AMB   LOCATION:  SDS                          FACILITY:  [facility]   PHYSICIAN:  Jillyn Hidden A. Rankin, M.D.   DATE OF BIRTH:  [birth year]   DATE OF PROCEDURE:  01/04/2007  DATE OF DISCHARGE:                               OPERATIVE REPORT   PREOPERATIVE DIAGNOSES:  1. Dense vitreous hemorrhage, right eye.  2. Proliferative diabetic retinopathy, progressive, right eye.  3. Neovascular glaucoma, right eye, with possible early cyclitic      membrane formation and profound vision loss, right eye.  4. History of neovascular glaucoma.   POSTOPERATIVE DIAGNOSES:  1. Dense vitreous hemorrhage, right eye.  2. Proliferative diabetic retinopathy, progressive, right eye.  3. Neovascular glaucoma, right eye, with possible early cyclitic      membrane formation and profound vision loss, right eye.  4. History of neovascular glaucoma.   PROCEDURES:  1. Posterior vitrectomy with membrane peel, 20 gauge, using membrane      dissection of the posterior capsule high vascular membranes, as      well as posterior synechiae.  2. Endolaser panphotocoagulation, 20 gauge.  3. Laser iridoplasty anteriorly.  4. Removal of papillary fibrin membrane using scissors and forceps      dissection.  5. Instillation of silicone oil, 5000 centistokes, to the right eye.   SURGEON:  Alford Highland. Rankin, M.D.   ANESTHESIA:  Local retrobulbar with monitored anesthetic control.   INDICATIONS FOR PROCEDURE:  The patient is a 70 year old man, who has  profound vision loss and recurrent vitreous hemorrhage, mostly anterior,  with what appears a cyclitic membrane formation and highly vascular  fibrous tissue with significant iris rubeosis.  The patient understands  this is an attempt to stabilize the globe, clear the vitreous and visual  axis and overall opacities, and to render the eye full permanently of  silicone  oil to stabilize the globe.  The patient understands the risks  of anesthesia, including the rare occurrence of death, but also to the  eye, including but not limited to hemorrhage, infection, scarring, need  for further surgery, no change in vision, loss of vision, progression of  disease despite intervention.  He understands these risks and wishes to  proceed with surgical intervention.  Appropriate signed consent was  obtained and the patient was taken to the operating room.   DESCRIPTION OF PROCEDURES:  In the operating room, appropriate  monitoring was followed by mild sedation.  Marcaine 0.75% was delivered,  5 mL retrobulbar, followed by an additional 5 mL laterally in the  fashion of a modified [employer]  The right  periocular region was  sterilely prepped and draped in the usual ophthalmic fashion.  A lid  speculum was applied.  A conjunctival peritomy was then fashioned  inferotemporally and superonasally and supertemporally.  The infusion  cannula was placed 3.5 mm posterior to the limbus in the inferotemporal  quadrant.  Its placement could not be verified because of the blocked  visual axis, both from anterior fibrous tissue proliferation on the  surface of the lesion, as well as posterior surface proliferations and  what appeared to be hemorrhage into the capsular region behind the  intraocular lens.   For this reason, an anterior chamber maintainer was then used to  maintain visualization.  A separate limbal incision was made to allow  for fibrous dissection anteriorly.  This was carried out and the  pupillary fibrin membrane was opened and removed.  Laser iridoplasty was  applied to large neovascular vessels on the surface of the iris to  minimize intraoperative bleeding.   At this time, posterior sclerotomies were then fashioned.  Openings were  made in the capsule so as to allow flow from the anterior chamber into  the posterior chamber of fluid.   Soon, the  posterior chamber infusion cannula was visualized.  The  infusion was switched.  The anterior chamber maintainer, however, was  left in place with BSS attached to it so as to clear the view, should it  cloud up.  This worked without difficulty, maintaining the visualization  throughout.  Dense fibrous tissues were removed from the visual axis.  Strikingly, there was only moderate dense vitreous hemorrhage  posteriorly, but there was no residual vitreous at all.  The visual axis  fluid was then fashioned.  The fluid-air exchange was then cleared.  The  supertemporal sclerotomy was then closed with 7-0 Vicryl.  Thereafter,  an air-silicone oil exchange was carried out passively.  At this time,  the infusion was removed and similarly closed with 7-0 Vicryl.  The  conjunctiva was closed with 7-0 Vicryl.  Subconjunctival injection of  Decadron was applied.  A sterile patch and a Fox shield were applied.  The patient tolerated the procedure very well in the right eye without  difficulty, taken to the recovery room in good and stable condition.      [name], M.D.  Electronically Signed     GAR/MEDQ  D:  01/04/2007  T:  01/04/2007  Job:  [id number]

## 2011-01-24 NOTE — Op Note (Signed)
NAME:  [name], Antonio Page NO.:  [id number]   MEDICAL RECORD NO.:  [id number]          PATIENT TYPE:  AMB   LOCATION:  DSC                          FACILITY:  [facility]   PHYSICIAN:  Velora Heckler, MD      DATE OF BIRTH:  1941/04/12   DATE OF PROCEDURE:  01/23/2005  DATE OF DISCHARGE:                                 OPERATIVE REPORT   PREOPERATIVE DIAGNOSIS:  Sebaceous cyst, left shoulder.   POSTOPERATIVE DIAGNOSIS:  Sebaceous cyst, left shoulder.   PROCEDURE:  Excision of sebaceous cyst, left shoulder (3 x 3 x 2.5 cm).   SURGEON:  Velora Heckler, MD   ANESTHESIA:  1% Xylocaine with epinephrine.   BLOOD LOSS:  Minimal.   PREPARATION:  Betadine.   COMPLICATIONS:  None.   INDICATIONS:  The patient is a 70 year old white male well-known to my  surgical practice.  The patient has had a large sebaceous cyst on the left  shoulder for some time.  It is been previously inflamed and treated with  antibiotics.  He desires surgical excision.  He does have diabetes as a  complicating factor.   BODY OF REPORT:  Procedure was done in the minor procedure room at [facility].  The patient was placed in a right lateral decubitus  position.  An area on the left shoulder was prepped with Betadine and  draped.  Skin was anesthetized with local anesthetic.  An elliptical  incision was made a #15 blade and carried down through the skin to the  subcutaneous tissues.  Mass is sizable, measuring approximately 3 cm in  greatest dimension.  It is excised sharply down to the level the trapezius muscle.  Specimen is  submitted to pathology for review.  Hemostasis is obtained with direct  pressure.  Skin edges are reapproximated with interrupted 3-0 nylon sutures.  Neosporin and sterile dressing are placed.  The patient tolerated the  procedure well.      TMG/MEDQ  D:  01/23/2005  T:  01/23/2005  Job:  [id number]   cc:   Velora Heckler, MD  [address] [place]  [place] [address]

## 2011-01-24 NOTE — Procedures (Signed)
[facility]  Patient:    Antonio Page, Antonio Page                     MRN: [id number] Proc. Date: 08/26/00 Adm. Date:  [id number] Attending:  Orland Mustard CC:         Richard A. Jacky Kindle, M.D.   Procedure Report  DATE OF BIRTH:  04-01-1941  PROCEDURE:  Colonoscopy.  MEDICATIONS:  Fentanyl 75 mcg, Versed 6 mg IV.  SCOPE:  Adult Olympus video colonoscope.  INDICATIONS:  A previous history of multiple adenomatous polyps removed one year ago by another physician.  This is done as a one year follow-up.  DESCRIPTION OF PROCEDURE:  The procedure had been explained to the patient and consent obtained.  With the patient in the left lateral decubitus, the adult Olympus video colonoscope was inserted and advanced under direct visualization.  The prep was excellent.  We were able to advance over to the cecum using abdominal pressure and position changes.  We advanced down to the tip of the cecum.  The scope was withdrawn.  The appendiceal orifice and the ileocecal valve were seen.  The cecum, ascending colon, hepatic flexure, transverse colon, splenic flexure, descending, and sigmoid colon seen well upon withdrawal.  No polyps or any other lesions were seen.  The rectum was also seen upon withdrawal and was normal.  The patient tolerated the procedure well maintained on low-flow oxygen and pulse oximeter throughout the procedure with no obvious problem.  ASSESSMENT:  No evidence of further colon polyps.  PLAN:  Will recommend repeating in three years. DD:  08/26/00 TD:  08/27/00 Job: [id number]

## 2011-01-24 NOTE — Op Note (Signed)
NAME:  Antonio Page, Antonio Page                        ACCOUNT NO.:  [id number]   MEDICAL RECORD NO.:  [id number]                   PATIENT TYPE:  AMB   LOCATION:  ENDO                                 FACILITY:  [facility]   PHYSICIAN:  James L. Malon Kindle., M.D.          DATE OF BIRTH:  May 13, 1941   DATE OF PROCEDURE:  09/19/2003  DATE OF DISCHARGE:                                 OPERATIVE REPORT   PROCEDURE PERFORMED:  Colonoscopy.   ENDOSCOPIST:  Llana Aliment. Edwards, M.D.   MEDICATIONS:  Fentanyl 50 mcg, Versed 5 mg IV.   INSTRUMENT USED:  Pediatric Olympus video colonoscope.   INDICATIONS FOR PROCEDURE:  Patient with a previous history of adenomatous  colon polyps.  This is done as a routine follow-up.   DESCRIPTION OF PROCEDURE:  The procedure had been explained to the patient  and consent obtained.  With the patient in the left lateral decubitus  position, the pediatric adjustable Olympus video colonoscope was inserted  and advanced.  Using abdominal pressures and position changes, we were able  to advance to the cecum.  The ileocecal valve and appendiceal orifice were  seen.  The prep was excellent.  The scope was withdrawn and the cecum,  ascending colon, transverse colon, splenic flexure, descending and sigmoid  colon were seen well.  No polyps were seen.  There was no significant  diverticular disease.  The scope was withdrawn.  The patient tolerated the  procedure well.   ASSESSMENT:  Previous history of colon polyps V12.72 with normal  colonoscopy.   PLAN:  Recommend yearly Hemoccults and repeating procedure in five years.                                               James L. Malon Kindle., M.D.    Waldron Session  D:  09/19/2003  T:  09/19/2003  Job:  [id number]   cc:   Geoffry Paradise, M.D.  [address]  [place]  [place] [address]  Fax: [phone]

## 2011-01-27 ENCOUNTER — Ambulatory Visit (INDEPENDENT_AMBULATORY_CARE_PROVIDER_SITE_OTHER): Payer: Medicare Other | Admitting: Surgery

## 2011-01-27 DIAGNOSIS — N186 End stage renal disease: Secondary | ICD-10-CM

## 2011-01-28 NOTE — Assessment & Plan Note (Signed)
OFFICE VISIT  NIRANJAN, RUFENER DOB:  06-Oct-1940                                       01/27/2011 EAVWU#:[id number]  The patient comes back again today for followup.  He is status post right upper arm fistula on 03/22.  He developed a mild steal syndrome and therefore underwent banding by Dr. Hart Rochester on 12/26/2010.  I have had multiple conversations with him regarding the discomfort he has in his right hand.  Initially he was unable to button buttons on his shirt however this has improved.  He is still having some issues with cutting his food.  He feels that he is either getting a little bit better or he is learning to adapt.  There is an excellent thrill within his fistula. We discussed again today our options of continuing down our current path versus fistula ligation.  I did state that the longer we get out from his original operation the less benefit I believe he will receive with fistula ligation.  We have collectively come to the conclusion that we will move forward with this fistula.  I believe it can be accessed within the next 2 weeks.  We will evaluate how he does with dialysis through the fistula.  The patient will contact me in the future if he has questions or concerns.    Jorge Ny, MD Electronically Signed  VWB/MEDQ  D:  01/27/2011  T:  01/28/2011  Job:  [id number]  cc:   Fayrene Fearing L. Deterding, M.D.

## 2011-03-03 ENCOUNTER — Encounter (INDEPENDENT_AMBULATORY_CARE_PROVIDER_SITE_OTHER): Payer: Medicare Other

## 2011-03-03 ENCOUNTER — Ambulatory Visit: Payer: Medicare Other | Admitting: Surgery

## 2011-03-03 DIAGNOSIS — N186 End stage renal disease: Secondary | ICD-10-CM

## 2011-03-03 DIAGNOSIS — Z48812 Encounter for surgical aftercare following surgery on the circulatory system: Secondary | ICD-10-CM

## 2011-03-03 DIAGNOSIS — T82898A Other specified complication of vascular prosthetic devices, implants and grafts, initial encounter: Secondary | ICD-10-CM

## 2011-03-10 ENCOUNTER — Ambulatory Visit: Payer: Medicare Other | Admitting: Surgery

## 2011-03-17 ENCOUNTER — Ambulatory Visit (INDEPENDENT_AMBULATORY_CARE_PROVIDER_SITE_OTHER): Payer: Medicare Other | Admitting: Surgery

## 2011-03-17 DIAGNOSIS — T82898A Other specified complication of vascular prosthetic devices, implants and grafts, initial encounter: Secondary | ICD-10-CM

## 2011-03-17 DIAGNOSIS — N186 End stage renal disease: Secondary | ICD-10-CM

## 2011-03-18 NOTE — Assessment & Plan Note (Signed)
OFFICE VISIT  SHAUNAK, KREIS DOB:  1941-01-30                                       03/17/2011 [id number]#:[id number]  The patient comes back in today for followup.  He underwent right upper arm fistula on 03/22.  He had a mild steal syndrome and had this banded on 04/19.  He still had some issues with steal however these were mild and after discussion we decided to continue using the fistula.  He recently began complaining of pain in his left hand with dialysis.  His left fistula is occluded in the mid arm but stays patent via communicating branch.  I had him duplexed.  There was no significant stenosis in either arm.  He has a radial pressure of 163 on the right.  PHYSICAL EXAMINATION:  On exam he is afebrile, hemodynamically stable. No ulceration.  There is palpable thrill within his right arm fistula. The left arm fistula is palpable in the antecubital crease.  There is no change in the intensity of his left radial pulse with compression of the fistula.  I recommended decreasing the amount of fluid that is taken off in an effort to avoid severe hypotension which is most likely contributing to his symptoms.  The patient will try this over the next couple weeks and if this helps with his symptoms we may not need to do anything further. If this is unsuccessful I have recommended that we just ligate the remaining portion of his left arm fistula to see if this has any improvement.  This would be the only possible intervention that might have any benefit.  The patient will contact me to get this scheduled if it needs to be done.  I would like to coordinate it so that I could remove his catheter at that same time.    Jorge Ny, MD Electronically Signed  VWB/MEDQ  D:  03/17/2011  T:  03/18/2011  Job:  [id number]  cc:   Dr Deterding Dr Allena Katz

## 2011-03-20 NOTE — Procedures (Unsigned)
VASCULAR LAB EXAM  INDICATION:  End-stage renal disease, complications of arteriovenous fistula.  HISTORY: Diabetes:  Yes. Cardiac:  No. Hypertension:  Yes. Smoking:  Previous.  EXAM:  Bilateral upper extremity arterial duplex.  IMPRESSION:  Patent multiphasic Doppler waveforms identified throughout the bilateral brachial, radial and ulnar arteries.  Mild diffuse vessel calcification is noted throughout without hemodynamically significant stenosis identified.  ___________________________________________ V. Charlena Cross, MD  SH/MEDQ  D:  03/03/2011  T:  03/03/2011  Job:  [id number]

## 2011-03-21 ENCOUNTER — Other Ambulatory Visit (HOSPITAL_COMMUNITY): Payer: Self-pay | Admitting: Nephrology

## 2011-03-21 DIAGNOSIS — N186 End stage renal disease: Secondary | ICD-10-CM

## 2011-03-24 ENCOUNTER — Ambulatory Visit (HOSPITAL_COMMUNITY)
Admission: RE | Admit: 2011-03-24 | Discharge: 2011-03-24 | Disposition: A | Discharge status: Home / Self Care | Payer: Medicare Other | Source: Ambulatory Visit | Attending: Nephrology | Admitting: Nephrology

## 2011-03-24 DIAGNOSIS — Z452 Encounter for adjustment and management of vascular access device: Secondary | ICD-10-CM | POA: Insufficient documentation

## 2011-03-24 DIAGNOSIS — N186 End stage renal disease: Secondary | ICD-10-CM | POA: Insufficient documentation

## 2011-05-29 ENCOUNTER — Other Ambulatory Visit (HOSPITAL_COMMUNITY): Payer: Self-pay | Admitting: Nephrology

## 2011-05-29 DIAGNOSIS — N186 End stage renal disease: Secondary | ICD-10-CM

## 2011-06-17 ENCOUNTER — Ambulatory Visit (HOSPITAL_COMMUNITY)
Admission: RE | Admit: 2011-06-17 | Discharge: 2011-06-17 | Disposition: A | Discharge status: Home / Self Care | Payer: Medicare Other | Source: Ambulatory Visit | Attending: Nephrology | Admitting: Nephrology

## 2011-06-17 DIAGNOSIS — N186 End stage renal disease: Secondary | ICD-10-CM | POA: Insufficient documentation

## 2011-06-17 MED ORDER — IOHEXOL 300 MG/ML  SOLN
100.0000 mL | Freq: Once | INTRAMUSCULAR | Status: AC | PRN
  Administered 2011-06-17: 20 mL via INTRAVENOUS

## 2011-06-27 ENCOUNTER — Other Ambulatory Visit: Payer: Self-pay | Admitting: Nephrology

## 2011-06-27 ENCOUNTER — Ambulatory Visit
Admission: RE | Admit: 2011-06-27 | Discharge: 2011-06-27 | Disposition: A | Discharge status: Home / Self Care | Payer: Medicare Other | Source: Ambulatory Visit | Attending: Nephrology | Admitting: Nephrology

## 2011-06-27 DIAGNOSIS — R05 Cough: Secondary | ICD-10-CM

## 2011-07-21 ENCOUNTER — Ambulatory Visit: Payer: Medicare Other | Admitting: Surgery

## 2011-07-21 ENCOUNTER — Other Ambulatory Visit: Payer: Medicare Other

## 2011-10-24 ENCOUNTER — Other Ambulatory Visit: Payer: Self-pay | Admitting: Nephrology

## 2011-10-24 ENCOUNTER — Ambulatory Visit
Admission: RE | Admit: 2011-10-24 | Discharge: 2011-10-24 | Disposition: A | Discharge status: Home / Self Care | Payer: Medicare Other | Source: Ambulatory Visit | Attending: Nephrology | Admitting: Nephrology

## 2011-10-24 DIAGNOSIS — J90 Pleural effusion, not elsewhere classified: Secondary | ICD-10-CM

## 2012-06-07 ENCOUNTER — Encounter (HOSPITAL_BASED_OUTPATIENT_CLINIC_OR_DEPARTMENT_OTHER): Payer: Self-pay | Admitting: *Deleted

## 2012-06-07 ENCOUNTER — Other Ambulatory Visit: Payer: Self-pay | Admitting: Orthopedic Surgery

## 2012-06-07 NOTE — Progress Notes (Signed)
To come in early for istat-will see if needs ekg

## 2012-06-07 NOTE — H&P (Signed)
Antonio Page is an 71 y.o. male.   Chief Complaint: c/o chronic and progressive STS of the right ring finger HPI: Antonio Page is now 71-years of age. He is right hand dominant. 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 He is currently on dialysis at [facility] on Monday, Wednesday and Friday. He has type II diabetes 1980 to the present. 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 He now presents for a chronically locking right ring finger with progressive Dupuytren's palmar fibromatosis of the right hand.     Past Medical History  Diagnosis Date  . Hypertension   . Sleep apnea     uses a cpap  . Diabetes mellitus   . GERD (gastroesophageal reflux disease)   . Chronic kidney disease     dialysis  . Arthritis   . Glaucoma   . Dialysis patient   . Blind right eye     Past Surgical History  Procedure Date  . Dg av dialysis  shunt access exist*l* or 2010    left upper arm-not using  . Dg av dialysis  shunt access exist*l* or 2012    right upper arm-using this one  . Shoulder arthroscopy     right  . Knee arthroscopy     left  . Tonsillectomy   . Eye surgery     multiple rt eye surgeries  . Coronary stent placement     No family history on file. Social History:  reports that he quit smoking about 17 years ago. He does not have any smokeless tobacco history on file. He reports that he does not drink alcohol. His drug history not on file.  Allergies: No Known Allergies  No prescriptions prior to admission    No results found for this or any previous visit (from the past 48 hour(s)).  No results found.   Pertinent items are noted in HPI.  There were no vitals taken for this visit.  General appearance: alert Head: Normocephalic, without obvious abnormality Neck: supple, symmetrical, trachea midline Resp: clear to auscultation bilaterally Cardio: regular rate  and rhythm GI: moderately obese but with normal bowel sounds Extremities: Inspection of his hands reveals signs of Heberden's and Bouchard's n odes consistent with osteoarthritis. He has significant palmar fibromatosis in the pretendinous fibers of the right ring finger and cannot extend the MP joint beyond a 5 degree flexion contracture. He has full ROM of his fingers in flexion/extension. He locks his right ring finger in flexion. His pulses and capillary refill are intact. He does not appear to have Steal syndrome in the right hand. He has full ROM of his thumb at the IP joint. There is no sign of stenosing tenosynovitis of the index, long or small fingers on the right. He has full flexion of his fingers on the left, thumb on the left and no sign of first dorsal compartment stenosing tenosynovitis.    Pulses: 2+ and symmetric Skin: normal Neurologic: Grossly normal   Assessment/Plan Impression: Chronic STS right ring finger with progressive Dupuytren's right palm  Plan: To the OR for release A-1 pulley right ring finger and excision palmar fascia right ring finger.The procedure, risks,benefits and post-op course were discussed with the patient at length and they were in agreement with the plan.   DASNOIT,Antonio Page 06/07/2012, 1:37 PM     H&P documentation: 06/08/2012  -History and Physical Reviewed  -Patient has been re-examined  -No  change in the plan of care  Wyn Forsterobert V Zyara Riling Jr, MD

## 2012-06-08 ENCOUNTER — Ambulatory Visit (HOSPITAL_BASED_OUTPATIENT_CLINIC_OR_DEPARTMENT_OTHER): Payer: Medicare Other | Admitting: Anesthesiology

## 2012-06-08 ENCOUNTER — Ambulatory Visit (HOSPITAL_BASED_OUTPATIENT_CLINIC_OR_DEPARTMENT_OTHER)
Admission: RE | Admit: 2012-06-08 | Discharge: 2012-06-08 | Disposition: A | Discharge status: Home / Self Care | Payer: Medicare Other | Source: Ambulatory Visit | Attending: Orthopedic Surgery | Admitting: Orthopedic Surgery

## 2012-06-08 ENCOUNTER — Encounter (HOSPITAL_BASED_OUTPATIENT_CLINIC_OR_DEPARTMENT_OTHER): Payer: Self-pay | Admitting: *Deleted

## 2012-06-08 ENCOUNTER — Encounter (HOSPITAL_BASED_OUTPATIENT_CLINIC_OR_DEPARTMENT_OTHER)
Admission: RE | Disposition: A | Discharge status: Home / Self Care | Payer: Self-pay | Source: Ambulatory Visit | Attending: Orthopedic Surgery

## 2012-06-08 ENCOUNTER — Encounter (HOSPITAL_BASED_OUTPATIENT_CLINIC_OR_DEPARTMENT_OTHER): Payer: Self-pay | Admitting: Anesthesiology

## 2012-06-08 DIAGNOSIS — I120 Hypertensive chronic kidney disease with stage 5 chronic kidney disease or end stage renal disease: Secondary | ICD-10-CM | POA: Insufficient documentation

## 2012-06-08 DIAGNOSIS — M65849 Other synovitis and tenosynovitis, unspecified hand: Secondary | ICD-10-CM | POA: Insufficient documentation

## 2012-06-08 DIAGNOSIS — J4489 Other specified chronic obstructive pulmonary disease: Secondary | ICD-10-CM | POA: Insufficient documentation

## 2012-06-08 DIAGNOSIS — J449 Chronic obstructive pulmonary disease, unspecified: Secondary | ICD-10-CM | POA: Insufficient documentation

## 2012-06-08 DIAGNOSIS — K219 Gastro-esophageal reflux disease without esophagitis: Secondary | ICD-10-CM | POA: Insufficient documentation

## 2012-06-08 DIAGNOSIS — M720 Palmar fascial fibromatosis [Dupuytren]: Secondary | ICD-10-CM | POA: Insufficient documentation

## 2012-06-08 DIAGNOSIS — N186 End stage renal disease: Secondary | ICD-10-CM | POA: Insufficient documentation

## 2012-06-08 DIAGNOSIS — Z992 Dependence on renal dialysis: Secondary | ICD-10-CM | POA: Insufficient documentation

## 2012-06-08 DIAGNOSIS — M65839 Other synovitis and tenosynovitis, unspecified forearm: Secondary | ICD-10-CM | POA: Insufficient documentation

## 2012-06-08 DIAGNOSIS — E119 Type 2 diabetes mellitus without complications: Secondary | ICD-10-CM | POA: Insufficient documentation

## 2012-06-08 HISTORY — DX: Unspecified osteoarthritis, unspecified site: M19.90

## 2012-06-08 HISTORY — PX: TRIGGER FINGER RELEASE: SHX641

## 2012-06-08 HISTORY — DX: Blindness, one eye, unspecified eye: H54.40

## 2012-06-08 HISTORY — DX: Gastro-esophageal reflux disease without esophagitis: K21.9

## 2012-06-08 HISTORY — DX: Unspecified glaucoma: H40.9

## 2012-06-08 HISTORY — DX: Essential (primary) hypertension: I10

## 2012-06-08 HISTORY — PX: DUPUYTREN CONTRACTURE RELEASE: SHX1478

## 2012-06-08 LAB — GLUCOSE, CAPILLARY: Glucose-Capillary: 157 mg/dL — ABNORMAL HIGH (ref 70–99)

## 2012-06-08 LAB — POCT I-STAT, CHEM 8
BUN: 29 mg/dL — ABNORMAL HIGH (ref 6–23)
Chloride: 100 mEq/L (ref 96–112)
HCT: 36 % — ABNORMAL LOW (ref 39.0–52.0)
Potassium: 4.5 mEq/L (ref 3.5–5.1)

## 2012-06-08 SURGERY — RELEASE, A1 PULLEY, FOR TRIGGER FINGER
Anesthesia: Monitor Anesthesia Care | Site: Finger | Laterality: Right | Wound class: Clean

## 2012-06-08 MED ORDER — FENTANYL CITRATE 0.05 MG/ML IJ SOLN: 25.0000 ug | INTRAMUSCULAR | Status: DC | PRN

## 2012-06-08 MED ORDER — LIDOCAINE HCL 2 % IJ SOLN
INTRAMUSCULAR | Status: DC | PRN
  Administered 2012-06-08: 4 mL

## 2012-06-08 MED ORDER — LIDOCAINE HCL (CARDIAC) 20 MG/ML IV SOLN
INTRAVENOUS | Status: DC | PRN
  Administered 2012-06-08: 50 mg via INTRAVENOUS

## 2012-06-08 MED ORDER — TRAMADOL HCL 50 MG PO TABS: ORAL_TABLET | ORAL | Status: DC

## 2012-06-08 MED ORDER — FENTANYL CITRATE 0.05 MG/ML IJ SOLN
INTRAMUSCULAR | Status: DC | PRN
  Administered 2012-06-08: 50 ug via INTRAVENOUS
  Administered 2012-06-08: 25 ug via INTRAVENOUS

## 2012-06-08 MED ORDER — SODIUM CHLORIDE 0.9 % IV SOLN
INTRAVENOUS | Status: DC
  Administered 2012-06-08: 07:00:00 via INTRAVENOUS

## 2012-06-08 MED ORDER — PROPOFOL INFUSION 10 MG/ML OPTIME
INTRAVENOUS | Status: DC | PRN
  Administered 2012-06-08: 50 ug/kg/min via INTRAVENOUS

## 2012-06-08 MED ORDER — LACTATED RINGERS IV SOLN
INTRAVENOUS | Status: DC
Start: 2012-06-08 — End: 2012-06-08

## 2012-06-08 MED ORDER — ONDANSETRON HCL 4 MG/2ML IJ SOLN
4.0000 mg | Freq: Four times a day (QID) | INTRAMUSCULAR | Status: DC | PRN
Start: 2012-06-08 — End: 2012-06-08

## 2012-06-08 MED ORDER — CHLORHEXIDINE GLUCONATE 4 % EX LIQD: 60.0000 mL | Freq: Once | CUTANEOUS | Status: DC

## 2012-06-08 SURGICAL SUPPLY — 57 items
BANDAGE ADHESIVE 1X3 (GAUZE/BANDAGES/DRESSINGS) IMPLANT
BANDAGE CONFORM 3  STR LF (GAUZE/BANDAGES/DRESSINGS) IMPLANT
BANDAGE ELASTIC 3 VELCRO ST LF (GAUZE/BANDAGES/DRESSINGS) IMPLANT
BANDAGE GAUZE ELAST BULKY 4 IN (GAUZE/BANDAGES/DRESSINGS) IMPLANT
BLADE MINI RND TIP GREEN BEAV (BLADE) IMPLANT
BLADE SURG 15 STRL LF DISP TIS (BLADE) ×1 IMPLANT
BLADE SURG 15 STRL SS (BLADE) ×1
BNDG ELASTIC 2 VLCR STRL LF (GAUZE/BANDAGES/DRESSINGS) ×2 IMPLANT
BNDG ESMARK 4X9 LF (GAUZE/BANDAGES/DRESSINGS) ×2 IMPLANT
BRUSH SCRUB EZ PLAIN DRY (MISCELLANEOUS) ×2 IMPLANT
CLOTH BEACON ORANGE TIMEOUT ST (SAFETY) ×2 IMPLANT
CORDS BIPOLAR (ELECTRODE) ×2 IMPLANT
COVER MAYO STAND STRL (DRAPES) ×2 IMPLANT
COVER TABLE BACK 60X90 (DRAPES) ×2 IMPLANT
CUFF TOURNIQUET SINGLE 18IN (TOURNIQUET CUFF) IMPLANT
CUFF TOURNIQUET SINGLE 24IN (TOURNIQUET CUFF) ×2 IMPLANT
DECANTER SPIKE VIAL GLASS SM (MISCELLANEOUS) IMPLANT
DRAPE EXTREMITY T 121X128X90 (DRAPE) ×2 IMPLANT
DRAPE SURG 17X23 STRL (DRAPES) ×2 IMPLANT
DRSG EMULSION OIL 3X3 NADH (GAUZE/BANDAGES/DRESSINGS) IMPLANT
GAUZE SPONGE 4X4 12PLY STRL LF (GAUZE/BANDAGES/DRESSINGS) IMPLANT
GAUZE XEROFORM 1X8 LF (GAUZE/BANDAGES/DRESSINGS) IMPLANT
GLOVE BIO SURGEON STRL SZ 6.5 (GLOVE) ×2 IMPLANT
GLOVE BIOGEL M STRL SZ7.5 (GLOVE) ×2 IMPLANT
GLOVE ORTHO TXT STRL SZ7.5 (GLOVE) ×2 IMPLANT
GOWN BRE IMP PREV XXLGXLNG (GOWN DISPOSABLE) ×4 IMPLANT
GOWN PREVENTION PLUS XLARGE (GOWN DISPOSABLE) ×2 IMPLANT
GOWN PREVENTION PLUS XXLARGE (GOWN DISPOSABLE) IMPLANT
GOWN STRL REIN XL XLG (GOWN DISPOSABLE) IMPLANT
LOOP VESSEL MAXI BLUE (MISCELLANEOUS) IMPLANT
NEEDLE 27GAX1X1/2 (NEEDLE) ×4 IMPLANT
NEEDLE HYPO 25X1 1.5 SAFETY (NEEDLE) IMPLANT
NS IRRIG 1000ML POUR BTL (IV SOLUTION) ×2 IMPLANT
PACK BASIN DAY SURGERY FS (CUSTOM PROCEDURE TRAY) ×2 IMPLANT
PAD CAST 3X4 CTTN HI CHSV (CAST SUPPLIES) IMPLANT
PAD CAST 4YDX4 CTTN HI CHSV (CAST SUPPLIES) IMPLANT
PADDING CAST ABS 4INX4YD NS (CAST SUPPLIES) ×1
PADDING CAST ABS COTTON 4X4 ST (CAST SUPPLIES) ×1 IMPLANT
PADDING CAST COTTON 3X4 STRL (CAST SUPPLIES)
PADDING CAST COTTON 4X4 STRL (CAST SUPPLIES)
SPLINT PLASTER CAST XFAST 3X15 (CAST SUPPLIES) IMPLANT
SPLINT PLASTER XTRA FASTSET 3X (CAST SUPPLIES)
SPONGE GAUZE 4X4 12PLY (GAUZE/BANDAGES/DRESSINGS) IMPLANT
STOCKINETTE 4X48 STRL (DRAPES) ×2 IMPLANT
STRIP CLOSURE SKIN 1/2X4 (GAUZE/BANDAGES/DRESSINGS) ×2 IMPLANT
SUT ETHILON 5 0 P 3 18 (SUTURE)
SUT NYLON ETHILON 5-0 P-3 1X18 (SUTURE) IMPLANT
SUT PROLENE 4 0 P 3 18 (SUTURE) ×4 IMPLANT
SUT SILK 4 0 PS 2 (SUTURE) IMPLANT
SUT VIC AB 4-0 P2 18 (SUTURE) IMPLANT
SYR 3ML 23GX1 SAFETY (SYRINGE) IMPLANT
SYR BULB 3OZ (MISCELLANEOUS) IMPLANT
SYR CONTROL 10ML LL (SYRINGE) ×2 IMPLANT
TOWEL OR 17X24 6PK STRL BLUE (TOWEL DISPOSABLE) ×4 IMPLANT
TRAY DSU PREP LF (CUSTOM PROCEDURE TRAY) ×2 IMPLANT
UNDERPAD 30X30 INCONTINENT (UNDERPADS AND DIAPERS) ×2 IMPLANT
WATER STERILE IRR 1000ML POUR (IV SOLUTION) IMPLANT

## 2012-06-08 NOTE — Anesthesia Procedure Notes (Signed)
Procedure Name: MAC Performed by: Tylia Ewell W Pre-anesthesia Checklist: Patient identified, Timeout performed, Emergency Drugs available, Suction available and Patient being monitored Patient Re-evaluated:Patient Re-evaluated prior to inductionOxygen Delivery Method: Simple face mask Placement Confirmation: positive ETCO2     

## 2012-06-08 NOTE — Transfer of Care (Signed)
Immediate Anesthesia Transfer of Care Note  Patient: Antonio Page  Procedure(s) Performed: Procedure(s) (LRB) with comments: RELEASE TRIGGER FINGER/A-1 PULLEY (Right) DUPUYTREN CONTRACTURE RELEASE (Right) - right ring fascia excision with A1 Pulley right ring  Patient Location: PACU  Anesthesia Type: MAC  Level of Consciousness: awake, alert  and oriented  Airway & Oxygen Therapy: Patient Spontanous Breathing  Post-op Assessment: Report given to PACU RN and Post -op Vital signs reviewed and stable  Post vital signs: Reviewed and stable  Complications: No apparent anesthesia complications

## 2012-06-08 NOTE — Anesthesia Preprocedure Evaluation (Signed)
Anesthesia Evaluation  Patient identified by MRN, date of birth, ID band Patient awake    Reviewed: Allergy & Precautions, H&P , NPO status , Patient's Chart, lab work & pertinent test results  Airway Mallampati: III  Neck ROM: full    Dental   Pulmonary shortness of breath, sleep apnea , COPDformer smoker,          Cardiovascular hypertension, + CAD and + Cardiac Stents     Neuro/Psych    GI/Hepatic GERD-  ,  Endo/Other  diabetesMorbid obesity  Renal/GU ESRF and DialysisRenal disease     Musculoskeletal  (+) Arthritis -,   Abdominal   Peds  Hematology   Anesthesia Other Findings   Reproductive/Obstetrics                           Anesthesia Physical Anesthesia Plan  ASA: III  Anesthesia Plan: MAC   Post-op Pain Management:    Induction: Intravenous  Airway Management Planned: Simple Face Mask  Additional Equipment:   Intra-op Plan:   Post-operative Plan:   Informed Consent: I have reviewed the patients History and Physical, chart, labs and discussed the procedure including the risks, benefits and alternatives for the proposed anesthesia with the patient or authorized representative who has indicated his/her understanding and acceptance.     Plan Discussed with: CRNA and Surgeon  Anesthesia Plan Comments:         Anesthesia Quick Evaluation

## 2012-06-08 NOTE — Brief Op Note (Signed)
06/08/2012  8:48 AM  PATIENT:  Antonio Page  71 y.o. male  PRE-OPERATIVE DIAGNOSIS:  STENOSING TENOSYNOVITIS RIGHT RING, DUPUYTRENS OF RIGHT RING  POST-OPERATIVE DIAGNOSIS:  STENOSING TENOSYNOVITIS RIGHT RING, DUPUYTRENS OF RIGHT RING  PROCEDURE:  Procedure(s) (LRB) with comments: RELEASE TRIGGER FINGER/A-1 PULLEY (Right) DUPUYTREN CONTRACTURE RELEASE (Right) - right ring fascia excision with A1 Pulley right ring  SURGEON:  Surgeon(s) and Role:    * Wyn Forster., MD - Primary  PHYSICIAN ASSISTANT:   ASSISTANTS:Brynja Marker Dasnoit,P.A-C   ANESTHESIA:   local  EBL:     BLOOD ADMINISTERED:none  DRAINS: none   LOCAL MEDICATIONS USED:  XYLOCAINE   SPECIMEN:  No Specimen  DISPOSITION OF SPECIMEN:  N/A  COUNTS:  YES  TOURNIQUET:  * Missing tourniquet times found for documented tourniquets in log:  62330 *  DICTATION: .Other Dictation: Dictation Number [id number]  PLAN OF CARE: Discharge to home after PACU  PATIENT DISPOSITION:  PACU - hemodynamically stable.

## 2012-06-08 NOTE — Anesthesia Postprocedure Evaluation (Signed)
Anesthesia Post Note  Patient: Antonio Page  Procedure(s) Performed: Procedure(s) (LRB): RELEASE TRIGGER FINGER/A-1 PULLEY (Right) DUPUYTREN CONTRACTURE RELEASE (Right)  Anesthesia type: MAC  Patient location: PACU  Post pain: Pain level controlled and Adequate analgesia  Post assessment: Post-op Vital signs reviewed, Patient's Cardiovascular Status Stable and Respiratory Function Stable  Last Vitals:  Filed Vitals:   06/08/12 0901  BP: 135/44  Pulse:   Temp: 36.6 C  Resp:     Post vital signs: Reviewed and stable  Level of consciousness: awake, alert  and oriented  Complications: No apparent anesthesia complications

## 2012-06-08 NOTE — Progress Notes (Signed)
Right upper arm AV fistula positive for bruitt and thrill

## 2012-06-09 ENCOUNTER — Encounter (HOSPITAL_BASED_OUTPATIENT_CLINIC_OR_DEPARTMENT_OTHER): Payer: Self-pay | Admitting: Orthopedic Surgery

## 2012-06-09 NOTE — Op Note (Signed)
NAME:  AZAM, GERVASI NO.:  [id number]  MEDICAL RECORD NO.:  [id number]  LOCATION:                                 FACILITY:  PHYSICIAN:  Katy Fitch. Micaiah Litle, M.D. DATE OF BIRTH:  Jan 12, 1941  DATE OF PROCEDURE:  06/08/2012 DATE OF DISCHARGE:                              OPERATIVE REPORT   PREOPERATIVE DIAGNOSES: 1. Progressive Dupuytren's palmar fibromatosis, right palm involving     pretendinous fibers to ring finger with MP flexion contracture. 2. Chronic stenosing tenosynovitis, right ring finger at A1 pulley     with background chronic renal failure, hemodialysis, and diabetes.  POSTOPERATIVE DIAGNOSES: 1. Progressive Dupuytren's palmar fibromatosis, right palm involving     pretendinous fibers to ring finger with MP flexion contracture. 2. Chronic stenosing tenosynovitis, right ring finger at A1 pulley     with background chronic renal failure, hemodialysis, and diabetes.  OPERATION: 1. Resection of palmar fibromatosis, right ring finger pretendinous     fibers 2. Release of A1 pulley, right ring finger with incidental     synovectomy.  OPERATING SURGEON:  Katy Fitch. Gwyneth Fernandez, MD  ASSISTANT:  Marveen Reeks Dasnoit, PA-C  ANESTHESIA:  Lidocaine 2% palmar block and flexor sheath block supplemented by IV sedation.  SUPERVISING ANESTHESIOLOGIST:  Achille Rich, MD  INDICATIONS:  Roald Lukacs is a 71 year old gentleman referred through the courtesy of Geoffry Paradise, MD for evaluation and management of chronic locking of his right ring finger at the A1 pulley.  I have known Mr. Feil for many years, having treated him for his hand elements in the past.  He has chronic type 2 diabetes.  He has chronic renal failure and is now on hemodialysis Monday, Wednesday, Friday.  He has had progressive Dupuytren's palmar fibromatosis developed in the right palm in the pretendinous fibers to the ring finger and triggering of his right ring finger.  Due to his  multiple medical problems and the rapid onset of his fibromatosis, we recommended proceeding with release of A1 pulley and excision of his palmar fascia at this time.  Preoperatively, he was advised of potential risks and benefits of surgery.  He understands that over time he will get more Dupuytren's fibromatosis.  He will likely have other episodes of stenosing tenosynovitis given his background medical problems  After informed consent, he is brought to the operating at this time.  PROCEDURE:  Jeff Frieden is brought to room 2 of the [facility] and placed in supine position on the operating table.  Following the induction of IV sedation, the right hand and arm were prepped with Betadine soap and solution, sterilely draped.  A pneumatic tourniquet was applied to the proximal forearm due to a hemodialysis fistula in the proximal right brachium.  Following exsanguination of the right arm with Esmarch bandage, an arterial tourniquet on the proximal forearm was then inflated to 250 mmHg.  Procedure commenced with a extended Brunner zigzag incision from the level of the base of the A2 pulley at the base of the proximal phalanx to the carpal canal region.  Subcutaneous tissues were carefully divided, taking care to meticulously identify the palmar fascia.  This was removed with scalpel and  scissors, exposing the common digital vessels to the index, long, and ring fingers.  The fascia was completely excised exposing the flexor sheath.  There was marked fibrosis at the preputial fold proximal to the A1 pulley.  The A1 pulley was split with scalpel scissors.  The tendons delivered and a very thick cuff of fibrotic tenosynovium was resected with scissors dissection and use of rongeur.  Thereafter free range of motion of the ring finger was recovered passively and the flexion contracture of the MP joint was fully resolved.  The wound was then repaired with intradermal 4-0 Prolene  suture.  A compressive dressing was applied with Steri-Strips, sterile gauze, sterile Webril, and Ace bandage.  Mr. Holeman will be advised to begin immediate range of motion exercises.  We will see him back for followup in our office in 1 week. For aftercare, he is provided prescription for tramadol 50 mg 1 or 2 p.o. q.4 hours p.r.n. pain, 20 tablets without refill.  He will return for hemodialysis at the [facility] in 24 hours.     Katy Fitch Nikesha Kwasny, M.D.     RVS/MEDQ  D:  06/08/2012  T:  06/09/2012  Job:  [id number]

## 2012-06-29 ENCOUNTER — Other Ambulatory Visit: Payer: Self-pay | Admitting: Otolaryngology

## 2012-07-20 NOTE — H&P (Signed)
Assessment   Diabetes (250.00) (E11.9).  Skin neoplasm (239.2) (D49.2) Amended By: Serena Colonel 07/05/2012 21:40:43 PM EST. Reason For Visit  Bump in right ear. HPI  Couple month history of an enlarging lesion inside the right ear. He has been picking at it incessantly trying to get rid of it but it continues to scab over a period is dermatologist referred him here for evaluation and possible biopsy. He hasn't had skin cancer. He has had other lesions removed from his face but they have all been benign. Since I saw him last, he's been diagnosed with renal failure secondary to diabetes, and is currently on 3 times weekly hemodialysis. He gets heparin with his dialysis but otherwise is not on a blood thinner. Allergies  No Known Drug Allergies. Current Meds  Tums E-X CHEW;; RPT Allopurinol TABS;; RPT PriLOSEC OTC 20 MG Oral Tablet Delayed Release;; RPT HumuLIN 70/30 SUSP;; RPT MiraLax POWD (Polyethylene Glycol 3350);; RPT Renvela 800 MG Oral Tablet;; RPT HydrALAZINE HCl - 50 MG Oral Tablet;; RPT AmLODIPine Besylate 10 MG Oral Tablet;; RPT Multiple Vitamins TABS;; RPT Aspirin TABS;; RPT Tylenol TABS;; RPT Claritin CAPS;; RPT. Active Problems  Allergic rhinitis  (477.9) (J30.9) Diabetes  (250.00) (E11.9) Hypertension  (401.9) (I10) Obstructive sleep apnea, adult  (327.23) (G47.33).  Renal failure (586) (N19). PSH  Cataract Surgery Hand Surgery Hernia Repair Knee Surgery Shoulder Surgery Tonsillectomy. Family Hx  Family history of cardiac disorder: Unknown (V17.49) (Z82.49) Family history of hypertension: Unknown (V17.49) (Z83.49) Prostate cancer: Unknown (C61). ROS  Systemic: Not feeling tired (fatigue).  No fever, no night sweats, and no recent weight loss. Head: No headache. Eyes: No eye symptoms. Otolaryngeal: No hearing loss  and no earache.  Tinnitus.  No purulent nasal discharge.  No nasal passage blockage (stuffiness).  Snoring  and sneezing.  No hoarseness  and no sore  throat. Cardiovascular: No chest pain or discomfort  and no palpitations. Pulmonary: No dyspnea, no cough, and no wheezing. Gastrointestinal: No dysphagia  and no heartburn.  No nausea, no abdominal pain, and no melena.  No diarrhea. Genitourinary: No dysuria. Endocrine: No muscle weakness. Musculoskeletal: Calf muscle cramps.  No arthralgias  and no soft tissue swelling. Neurological: No dizziness, no fainting, no tingling, and no numbness. Psychological: No anxiety  and no depression. Skin: No rash. 12 system ROS was obtained and reviewed on the Health Maintenance form dated today.  Positive responses are shown above.  If the symptom is not checked, the patient has denied it. Vital Signs   Recorded by Hamilton,Amy on 29 Jun 2012 09:20 AM BP:130/67,  HR: 61 b/min,  Height: 71 in, Weight: 295 lb, BMI: 41.1 kg/m2,  BMI Calculated: 41.14 ,  BSA Calculated: 2.49. Physical Exam  APPEARANCE: Well developed, overweight gentleman, in no acute distress.  Normal affect, in a pleasant mood.  Oriented to time, place and person. COMMUNICATION: Normal voice   HEAD & FACE:  No scars, lesions or masses of head and face.  Sinuses nontender to palpation.  Salivary glands without mass or tenderness.  Facial strength symmetric.  No facial lesion, scars, or mass. EYES: EOMI with normal primary gaze alignment. Visual acuity grossly intact.  PERRLA EXTERNAL EAR & NOSE: Multiple actinic changes on the external ear on the right, and a raised papular plaque-like lesion inside the external meatus anteriorly on the right. EAC & TYMPANIC MEMBRANE:  EAC shows no obstructing lesions or debris and tympanic membranes are normal bilaterally with good movement to insufflation. GROSS HEARING: Normal  TMJ:  Nontender  INTRANASAL EXAM: No polyps or purulence.  NASOPHARYNX: Normal, without lesions. LIPS, TEETH & GUMS: No lip lesions, normal dentition and normal gums. ORAL CAVITY/OROPHARYNX:  Oral mucosa moist without  lesion or asymmetry of the palate, tongue, tonsil or posterior pharynx. NECK:  Supple without adenopathy or mass. THYROID:  Normal with no masses palpable.  NEUROLOGIC:  No gross CN deficits. No nystagmus noted.   LYMPHATIC:  No enlarged nodes palpable. Procedure  BIOPSY OF LESION Pre-op Diagnosis: [Right external ear skin lesions  ] Post-op Diagnosis: Same Anesthesia: Local with. 1 % lidocaine with epinephrine Estimated Blood Loss: Minimal  Procedure: After cleansing with Betadine, a local anesthetic was injected around the lesion and allowed to stand for 10 minutes. The lesion was grasped with forceps and a portion was removed sharply and placed in buffered formalin. Tolerance: Excellent Unplanned interventions: None  Unplanned events: No complications. Signature  Electronically signed by : Serena Colonel  M.D.; 06/29/2012 9:40 AM EST. Electronically signed by : Serena Colonel  M.D.; 07/05/2012 9:40 PM EST.

## 2012-07-21 ENCOUNTER — Encounter (HOSPITAL_COMMUNITY): Payer: Self-pay | Admitting: Anesthesiology

## 2012-07-21 ENCOUNTER — Encounter (HOSPITAL_COMMUNITY): Payer: Self-pay | Admitting: *Deleted

## 2012-07-21 ENCOUNTER — Ambulatory Visit (HOSPITAL_COMMUNITY)
Admission: AD | Admit: 2012-07-21 | Discharge: 2012-07-21 | Disposition: A | Discharge status: Home / Self Care | Payer: Medicare Other | Source: Ambulatory Visit | Attending: Urology | Admitting: Urology

## 2012-07-21 ENCOUNTER — Other Ambulatory Visit: Payer: Self-pay | Admitting: Urology

## 2012-07-21 ENCOUNTER — Ambulatory Visit (HOSPITAL_COMMUNITY): Payer: Medicare Other | Admitting: Anesthesiology

## 2012-07-21 ENCOUNTER — Encounter (HOSPITAL_COMMUNITY)
Admission: AD | Disposition: A | Discharge status: Home / Self Care | Payer: Self-pay | Source: Ambulatory Visit | Attending: Urology

## 2012-07-21 DIAGNOSIS — Z79899 Other long term (current) drug therapy: Secondary | ICD-10-CM | POA: Insufficient documentation

## 2012-07-21 DIAGNOSIS — R3129 Other microscopic hematuria: Secondary | ICD-10-CM | POA: Insufficient documentation

## 2012-07-21 DIAGNOSIS — N135 Crossing vessel and stricture of ureter without hydronephrosis: Secondary | ICD-10-CM | POA: Insufficient documentation

## 2012-07-21 DIAGNOSIS — G4730 Sleep apnea, unspecified: Secondary | ICD-10-CM | POA: Insufficient documentation

## 2012-07-21 DIAGNOSIS — I10 Essential (primary) hypertension: Secondary | ICD-10-CM | POA: Insufficient documentation

## 2012-07-21 DIAGNOSIS — N1330 Unspecified hydronephrosis: Secondary | ICD-10-CM | POA: Insufficient documentation

## 2012-07-21 DIAGNOSIS — E119 Type 2 diabetes mellitus without complications: Secondary | ICD-10-CM | POA: Insufficient documentation

## 2012-07-21 DIAGNOSIS — Z794 Long term (current) use of insulin: Secondary | ICD-10-CM | POA: Insufficient documentation

## 2012-07-21 DIAGNOSIS — J45909 Unspecified asthma, uncomplicated: Secondary | ICD-10-CM | POA: Insufficient documentation

## 2012-07-21 DIAGNOSIS — K219 Gastro-esophageal reflux disease without esophagitis: Secondary | ICD-10-CM | POA: Insufficient documentation

## 2012-07-21 HISTORY — PX: CYSTOSCOPY WITH RETROGRADE PYELOGRAM, URETEROSCOPY AND STENT PLACEMENT: SHX5789

## 2012-07-21 HISTORY — DX: Calculus of kidney: N20.0

## 2012-07-21 LAB — BASIC METABOLIC PANEL
CO2: 26 mEq/L (ref 19–32)
Calcium: 9.6 mg/dL (ref 8.4–10.5)
Chloride: 104 mEq/L (ref 96–112)
Glucose, Bld: 210 mg/dL — ABNORMAL HIGH (ref 70–99)
Potassium: 4.8 mEq/L (ref 3.5–5.1)
Sodium: 143 mEq/L (ref 135–145)

## 2012-07-21 LAB — CBC
Hemoglobin: 12.6 g/dL — ABNORMAL LOW (ref 13.0–17.0)
Platelets: 109 10*3/uL — ABNORMAL LOW (ref 150–400)
RBC: 4.17 MIL/uL — ABNORMAL LOW (ref 4.22–5.81)
WBC: 9.2 10*3/uL (ref 4.0–10.5)

## 2012-07-21 LAB — SURGICAL PCR SCREEN
MRSA, PCR: NEGATIVE
Staphylococcus aureus: NEGATIVE

## 2012-07-21 LAB — GLUCOSE, CAPILLARY: Glucose-Capillary: 179 mg/dL — ABNORMAL HIGH (ref 70–99)

## 2012-07-21 SURGERY — CYSTOURETEROSCOPY, WITH RETROGRADE PYELOGRAM AND STENT INSERTION
Anesthesia: General | Site: Ureter | Laterality: Right | Wound class: Clean Contaminated

## 2012-07-21 MED ORDER — ACETAMINOPHEN 325 MG PO TABS: 650.0000 mg | ORAL_TABLET | ORAL | Status: DC | PRN

## 2012-07-21 MED ORDER — MUPIROCIN 2 % EX OINT
TOPICAL_OINTMENT | Freq: Two times a day (BID) | CUTANEOUS | Status: DC
  Administered 2012-07-21: 1 via NASAL

## 2012-07-21 MED ORDER — SODIUM CHLORIDE 0.9 % IV SOLN
INTRAVENOUS | Status: DC | PRN
  Administered 2012-07-21: 18:00:00 via INTRAVENOUS

## 2012-07-21 MED ORDER — ACETAMINOPHEN 650 MG RE SUPP
650.0000 mg | RECTAL | Status: DC | PRN
  Filled 2012-07-21: qty 1

## 2012-07-21 MED ORDER — CIPROFLOXACIN IN D5W 400 MG/200ML IV SOLN
INTRAVENOUS | Status: AC
  Filled 2012-07-21: qty 200

## 2012-07-21 MED ORDER — FENTANYL CITRATE 0.05 MG/ML IJ SOLN: 25.0000 ug | INTRAMUSCULAR | Status: DC | PRN

## 2012-07-21 MED ORDER — SUCCINYLCHOLINE CHLORIDE 20 MG/ML IJ SOLN
INTRAMUSCULAR | Status: DC | PRN
  Administered 2012-07-21: 100 mg via INTRAVENOUS

## 2012-07-21 MED ORDER — OXYCODONE HCL 5 MG PO TABS: 5.0000 mg | ORAL_TABLET | ORAL | Status: DC | PRN

## 2012-07-21 MED ORDER — HYDROCODONE-ACETAMINOPHEN 5-325 MG PO TABS: 1.0000 | ORAL_TABLET | Freq: Four times a day (QID) | ORAL | Status: DC | PRN

## 2012-07-21 MED ORDER — ONDANSETRON HCL 4 MG/2ML IJ SOLN: 4.0000 mg | Freq: Four times a day (QID) | INTRAMUSCULAR | Status: DC | PRN

## 2012-07-21 MED ORDER — MUPIROCIN 2 % EX OINT
TOPICAL_OINTMENT | CUTANEOUS | Status: AC
  Administered 2012-07-21: 1 via NASAL
  Filled 2012-07-21: qty 22

## 2012-07-21 MED ORDER — CIPROFLOXACIN IN D5W 400 MG/200ML IV SOLN
INTRAVENOUS | Status: DC | PRN
  Administered 2012-07-21: 400 mg via INTRAVENOUS

## 2012-07-21 MED ORDER — SODIUM CHLORIDE 0.9 % IJ SOLN: 3.0000 mL | Freq: Two times a day (BID) | INTRAMUSCULAR | Status: DC

## 2012-07-21 MED ORDER — LIDOCAINE HCL (CARDIAC) 20 MG/ML IV SOLN
INTRAVENOUS | Status: DC | PRN
  Administered 2012-07-21: 50 mg via INTRAVENOUS

## 2012-07-21 MED ORDER — SODIUM CHLORIDE 0.9 % IR SOLN
Status: DC | PRN
  Administered 2012-07-21: 3000 mL

## 2012-07-21 MED ORDER — LIDOCAINE HCL 2 % EX GEL
CUTANEOUS | Status: AC
  Filled 2012-07-21: qty 10

## 2012-07-21 MED ORDER — IOHEXOL 300 MG/ML  SOLN
INTRAMUSCULAR | Status: AC
  Filled 2012-07-21: qty 1

## 2012-07-21 MED ORDER — PROMETHAZINE HCL 25 MG/ML IJ SOLN: 6.2500 mg | INTRAMUSCULAR | Status: DC | PRN

## 2012-07-21 MED ORDER — FENTANYL CITRATE 0.05 MG/ML IJ SOLN
INTRAMUSCULAR | Status: DC | PRN
  Administered 2012-07-21: 50 ug via INTRAVENOUS

## 2012-07-21 MED ORDER — SODIUM CHLORIDE 0.9 % IV SOLN: 250.0000 mL | INTRAVENOUS | Status: DC | PRN

## 2012-07-21 MED ORDER — PROPOFOL 10 MG/ML IV BOLUS
INTRAVENOUS | Status: DC | PRN
  Administered 2012-07-21: 150 mg via INTRAVENOUS

## 2012-07-21 MED ORDER — SODIUM CHLORIDE 0.9 % IJ SOLN: 3.0000 mL | INTRAMUSCULAR | Status: DC | PRN

## 2012-07-21 SURGICAL SUPPLY — 14 items
BAG URO CATCHER STRL LF (DRAPE) ×2 IMPLANT
CATH URET 5FR 28IN OPEN ENDED (CATHETERS) ×2 IMPLANT
CLOTH BEACON ORANGE TIMEOUT ST (SAFETY) ×2 IMPLANT
DRAPE CAMERA CLOSED 9X96 (DRAPES) ×2 IMPLANT
GLOVE SURG SS PI 8.0 STRL IVOR (GLOVE) ×2 IMPLANT
GOWN PREVENTION PLUS XLARGE (GOWN DISPOSABLE) ×2 IMPLANT
GOWN STRL REIN XL XLG (GOWN DISPOSABLE) ×2 IMPLANT
GUIDEWIRE STR DUAL SENSOR (WIRE) ×2 IMPLANT
KIT BALLN UROMAX 15FX4 (MISCELLANEOUS) ×1 IMPLANT
KIT BALLN UROMAX 26 75X4 (MISCELLANEOUS) ×1
MANIFOLD NEPTUNE II (INSTRUMENTS) ×2 IMPLANT
PACK CYSTO (CUSTOM PROCEDURE TRAY) ×2 IMPLANT
STENT CONTOUR 6FRX26X.038 (STENTS) ×2 IMPLANT
TUBING CONNECTING 10 (TUBING) ×2 IMPLANT

## 2012-07-21 NOTE — Progress Notes (Signed)
PACU Nursing Note: Discharge teaching done w/ pt, wife and daughter while at bedside, pt teaching done re: (1) review of home medications to cont per MD post op instructions, (2) safety while taking pain medicine (3) diet instructions when home (4) when to call MD, i.e pain issues, fever etc, (5) activity for next 24 hours per anesthesia recommendations (6) when to follow up with MD in office for stent removal (7) importance of keeping dialysis appt tomorrow am at 1000hrs and Friday. Opportunity for questions provided several times throughtout pt discharge teaching time, copy of dc instructions w/ one (1) Rx given to wife, again opportunity for questions provided. Pt then ambulated to wheelchair w/ min assistance, gait very steady, pt and wife escorted to private vehicle w/ pt in wheelchair, assisted into vehicle, again opportunity for questions provided.

## 2012-07-21 NOTE — Anesthesia Postprocedure Evaluation (Signed)
  Anesthesia Post-op Note  Patient: Antonio Page  Procedure(s) Performed: Procedure(s) (LRB): CYSTOSCOPY WITH RETROGRADE PYELOGRAM, URETEROSCOPY AND STENT PLACEMENT (Right)  Patient Location: PACU  Anesthesia Type: General  Level of Consciousness: awake and alert   Airway and Oxygen Therapy: Patient Spontanous Breathing  Post-op Pain: mild  Post-op Assessment: Post-op Vital signs reviewed, Patient's Cardiovascular Status Stable, Respiratory Function Stable, Patent Airway and No signs of Nausea or vomiting  Post-op Vital Signs: stable  Complications: No apparent anesthesia complications

## 2012-07-21 NOTE — Transfer of Care (Signed)
Immediate Anesthesia Transfer of Care Note  Patient: Antonio Page  Procedure(s) Performed: Procedure(s) (LRB) with comments: CYSTOSCOPY WITH RETROGRADE PYELOGRAM, URETEROSCOPY AND STENT PLACEMENT (Right)  Patient Location: PACU  Anesthesia Type:General  Level of Consciousness: awake and alert   Airway & Oxygen Therapy: Patient Spontanous Breathing and Patient connected to face mask oxygen  Post-op Assessment: Report given to PACU RN and Post -op Vital signs reviewed and stable  Post vital signs: Reviewed and stable  Complications: No apparent anesthesia complications

## 2012-07-21 NOTE — Anesthesia Preprocedure Evaluation (Signed)
Anesthesia Evaluation  Patient identified by MRN, date of birth, ID band Patient awake    Reviewed: Allergy & Precautions, H&P , NPO status , Patient's Chart, lab work & pertinent test results  Airway Mallampati: II TM Distance: <3 FB Neck ROM: Full    Dental No notable dental hx.    Pulmonary shortness of breath, sleep apnea , COPD breath sounds clear to auscultation  + decreased breath sounds      Cardiovascular hypertension, + CAD and + Cardiac Stents Rhythm:Regular Rate:Normal + Systolic murmurs    Neuro/Psych negative neurological ROS  negative psych ROS   GI/Hepatic Neg liver ROS,   Endo/Other  diabetesMorbid obesity  Renal/GU ESRF and DialysisRenal disease  negative genitourinary   Musculoskeletal negative musculoskeletal ROS (+)   Abdominal   Peds negative pediatric ROS (+)  Hematology negative hematology ROS (+)   Anesthesia Other Findings   Reproductive/Obstetrics negative OB ROS                           Anesthesia Physical Anesthesia Plan  ASA: III  Anesthesia Plan: General   Post-op Pain Management:    Induction: Intravenous  Airway Management Planned: LMA and Oral ETT  Additional Equipment:   Intra-op Plan:   Post-operative Plan: Extubation in OR  Informed Consent: I have reviewed the patients History and Physical, chart, labs and discussed the procedure including the risks, benefits and alternatives for the proposed anesthesia with the patient or authorized representative who has indicated his/her understanding and acceptance.   Dental advisory given  Plan Discussed with: CRNA and Surgeon  Anesthesia Plan Comments:         Anesthesia Quick Evaluation

## 2012-07-21 NOTE — H&P (Signed)
ive Problems Problems  1. Abdominal Pain In The Right Lower Belly (RLQ) 789.03 2. Hydronephrosis 591 3. Microscopic Hematuria 599.72 4. Pyuria 791.9  History of Present Illness  Antonio Page is a 71 yo WM who has seen Dr. Isabel Caprice in the past for stones.  He had the onset this morning of severe right flank pain with nausea and dry heaves.  He is on dialysis but has some urine output and has seen no blood.  The pain is similar to his prior stone.  His prior imaging in 2012 showed some hydro but a stone was not definitively identified.   He did get relief when he passed some material.   Past Medical History Problems  1. History of  Asthma 493.90 2. History of  Diabetes Mellitus 250.00 3. History of  Esophageal Reflux 530.81 4. History of  Gout 274.9 5. History of  Hypertension 401.9 6. History of  Renal Failure 586 7. History of  Sleep Apnea 780.57  Surgical History Problems  1. History of  Eye Surgery 2. History of  Hernia Repair 3. History of  Knee Surgery 4. History of  Shoulder Surgery  Current Meds 1. Allopurinol 300 MG Oral Tablet; TAKE 0.5 TABLET Daily; Therapy: (Recorded:13Nov2013) to 2. AmLODIPine Besylate 10 MG Oral Tablet; Therapy: (Recorded:19Oct2012) to 3. Aspirin 81 MG Oral Tablet; Therapy: (Recorded:19Oct2012) to 4. HumuLIN 70/30 (70-30) 100 UNIT/ML Subcutaneous Suspension; Therapy:  (Recorded:19Oct2012) to 5. HydrALAZINE HCl 50 MG Oral Tablet; Therapy: 06Mar2013 to 6. Hydrocodone-Acetaminophen 5-325 MG Oral Tablet; Therapy: (Recorded:13Nov2013) to 7. Losartan Potassium 100 MG Oral Tablet; Therapy: (Recorded:13Nov2013) to 8. MiraLax Oral Packet; Therapy: (Recorded:19Oct2012) to 9. PriLOSEC 20 MG Oral Capsule Delayed Release; Therapy: (Recorded:19Oct2012) to 10. Renal Multivitamin Formula TABS; Therapy: (Recorded:19Oct2012) to 11. Renvela 800 MG Oral Tablet; Therapy: (Recorded:19Oct2012) to 12. Tums 500 MG Oral Tablet Chewable; extra strength; 2 daily; Therapy:  (Recorded:19Oct2012) to  Allergies No Known Allergies  1. No Known Allergies  Family History Problems  1. Son's history of  Cholelithiasis 2. Son's history of  Cholelithiasis 3. Fraternal history of  Diabetes Mellitus V18.0 4. Maternal aunt's history of  Diabetes Mellitus V18.0 5. Family history of  Family Health Status - Mother's Age 56 6. Family history of  Family Health Status Number Of Children 4 sons 7. Family history of  Father Deceased At Age ____ 49: Heart Disease 8. Family history of  Hypokalemia 9. Paternal history of  Prostate Cancer V16.42 10. Fraternal history of  Prostate Cancer V16.42  Social History Problems  1. Caffeine Use 2 per day 2. Former Smoker V15.82 Smoked 4 Cigars daily; Smoked for 20 years; Quit smoking ~ 2000; Denies any other forms of tobacco use 3. Marital History - Currently Married 4. Occupation: Retired: Visual merchandiser Denied  5. History of  Alcohol Use   Past and social history reviewed.   Review of Systems  Gastrointestinal: nausea and flank pain, but no diarrhea and no constipation (but he feels like he is blocked with the pain. ).  Constitutional: no fever.  Cardiovascular: leg swelling, but no chest pain.  Respiratory: shortness of breath.    Vitals Vital Signs [Data Includes: Last 1 Day]  13Nov2013 02:32PM  Blood Pressure: 220 / 70, LUE, Sitting Heart Rate: 60 13Nov2013 02:28PM  BMI Calculated: 43.95 BSA Calculated: 2.4 Weight: 290 lb  Temperature: 96.8 F  Physical Exam Constitutional: Well nourished and well developed . No acute distress.  Pulmonary: No respiratory distress and normal respiratory rhythm and effort.  Cardiovascular: Heart  rate and rhythm are normal . No peripheral edema.  Abdomen: The abdomen is obese. No masses are palpated. The abdomen is firm. Tenderness is present diffusely throughout the abdomen (right greater than left. ).  No umbilical hernia. No hepatosplenomegaly noted.     Results/Data Urine [Data Includes: Last 1 Day]   13Nov2013  COLOR YELLOW   APPEARANCE CLEAR   SPECIFIC GRAVITY 1.020   pH 7.5   GLUCOSE 250 mg/dL  BILIRUBIN NEG   KETONE NEG mg/dL  BLOOD TRACE   PROTEIN > 300 mg/dL  UROBILINOGEN 0.2 mg/dL  NITRITE NEG   LEUKOCYTE ESTERASE NEG   SQUAMOUS EPITHELIAL/HPF NONE SEEN   WBC 3-6 WBC/hpf  RBC 3-6 RBC/hpf  BACTERIA NONE SEEN   CRYSTALS NONE SEEN   CASTS NONE SEEN    The following images/tracing/specimen were independently visualized:  CT urogram today shows mild right hydronephrosis down to the distal ureter but there is no obvious stone. This what was reported on his previous CT at [facility] last year. His renal US in 12/12 showed no hydro.    Assessment Assessed  1. Abdominal Pain In The Right Lower Belly (RLQ) 789.03 2. Hydronephrosis 591   He has right flank pain and hydro without an obvious stone.  This could be from a clot or papillary necrosis or possibly a ureteral tumor.   Plan Abdominal Pain In The Right Lower Belly (RLQ) (789.03)  1. Morphine Sulfate 15 MG/ML Injection Solution; INJECT 10  MG Intramuscular; To Be Done:  13Nov2013; Status: HOLD FOR - Administration 2. Promethazine HCl 25 MG/ML Injection Solution; INJECT 25 MG Intramuscular; To Be Done:  13Nov2013; Status: HOLD FOR - Administration 3. AU CT-STONE PROTOCOL  Done: 13Nov2013 12:00AM 4. Follow-up Schedule Surgery Office  Follow-up  Requested for: 13Nov2013 Health Maintenance (V70.0)  5. UA With REFLEX  Done: 13Nov2013 02:04PM   I reviewed the options of pain management and follow up to see if he passes something vs cystoscopy with retrograde pyelography and possible ureteroscopy and stenting.   I reviewed the risks of bleeding, infection, ureteral injury, thrombotic events and anesthetic complications. I think he need cysto since this is a recurrent problem and there is no apparent stone.   Discussion/Summary  CC: Dr. Annie Sable.

## 2012-07-21 NOTE — Brief Op Note (Signed)
07/21/2012  6:58 PM  PATIENT:  Antonio Page  71 y.o. male  PRE-OPERATIVE DIAGNOSIS:  Right hydronephrosis  POST-OPERATIVE DIAGNOSIS:  Same with ureteral stricture.   PROCEDURE:  Procedure(s) (LRB) with comments: CYSTOSCOPY WITH RETROGRADE PYELOGRAM, URETEROSCOPY with Stricture management  AND STENT PLACEMENT (Right)  SURGEON:  Surgeon(s) and Role:    * Anner Crete, MD - Primary  PHYSICIAN ASSISTANT:   ASSISTANTS: none   ANESTHESIA:   general  EBL:     BLOOD ADMINISTERED:none  DRAINS: 6x26 right JJ stent   LOCAL MEDICATIONS USED:  NONE  SPECIMEN:  No Specimen  DISPOSITION OF SPECIMEN:  N/A  COUNTS:  YES  TOURNIQUET:  * No tourniquets in log *  DICTATION: .Other Dictation: Dictation Number [id number]  PLAN OF CARE: Discharge to home after PACU  PATIENT DISPOSITION:  PACU - hemodynamically stable.   Delay start of Pharmacological VTE agent (>24hrs) due to surgical blood loss or risk of bleeding: not applicable

## 2012-07-22 ENCOUNTER — Encounter (HOSPITAL_COMMUNITY): Payer: Self-pay | Admitting: Urology

## 2012-07-22 NOTE — Op Note (Signed)
NAME:  Antonio Page, Antonio Page NO.:  [id number]  MEDICAL RECORD NO.:  [id number]  LOCATION:  [facility]                         FACILITY:  [facility]  PHYSICIAN:  Excell Seltzer. Annabell Howells, M.D.    DATE OF BIRTH:  12-Nov-1940  DATE OF PROCEDURE:  07/21/2012 DATE OF DISCHARGE:                              OPERATIVE REPORT   PROCEDURE:  Cystoscopy, right retrograde pyelogram with interpretation, right distal ureteral balloon dilation, right ureteroscopy, and insertion of right double-J stent.  PREOPERATIVE DIAGNOSIS:  Right hydronephrosis.  POSTOPERATIVE DIAGNOSIS:  Right hydronephrosis with right ureteral stricture.  SURGEON:  Excell Seltzer. Annabell Howells, MD  ANESTHESIA:  General.  SPECIMEN:  None.  DRAINS:  A 6 cm double-J stent.  COMPLICATIONS:  None.  INDICATIONS:  Mr. Billig is a 71 year old white male, who presented with the onset earlier today of severe right flank pain with nausea.  He underwent a CT scan in our office which revealed hydronephrosis to the distal ureter but no stone was seen.  He had a similar issue approximately 2 years ago and was seen with Dr. Isabel Caprice. He passed something at that time and the symptoms abated and a followup ultrasound showed no residual hydronephrosis.  It was felt because of his symptoms that placement of the stent with possible ureteroscopy was indicated.  FINDINGS AND PROCEDURE:  He was taken to the operating room, where a general anesthetic was induced.  He was given 400 mg of Cipro IV.  He was placed in lithotomy position and fitted with PAS hose.  His perineum and genitalia were prepped with Betadine solution and draped in usual sterile fashion.  Cystoscopy was performed with a 22-French scope and 12-degree lens. Examination revealed a normal urethra.  The external sphincter was intact.  The prostatic urethra was approximately 3 cm in length with trilobar hyperplasia with some obstruction.  Examination of bladder revealed mild trabeculation.   No tumors, stones, or inflammation were noted and the ureteral orifices were unremarkable.  The right ureteral orifice was cannulated with a 5-French open-end catheter and contrast was instilled.  This revealed some narrowing of the distal 3 cm of the ureter with proximal dilation with clubbing of the calices.  No filling defects were identified.  At this point, a guidewire was passed through the open-end catheter to the kidney and the open-end catheter was removed.  Once the guidewire was in place, he had brisk efflux of bloody urine.  At this point, a 4 cm 15-French balloon dilation catheter was passed and the distal ureteral stricture was dilated at 18 atmospheres.  There was a persistent waist up to about 14 atmospheres but then it dissipated.  Once the balloon dilation had been completed, the cystoscope was removed leaving the wire in place and a 6.5-French short ureteroscope was passed alongside the wire.  This revealed no ureteral tumors or stones but there was some tearing of the ureteral wall consistent with ureteral stricture but it was not full thickness.  I advanced the scope to a few cm above the iliacs and I saw no abnormalities other than evidence of dilation.  At this point, the ureteroscope was removed.  The cystoscope was reinserted alongside the wire and  a 6-French 26 cm double-J stent without a string was passed over the wire to the kidney under fluoroscopic guidance.  The wire was removed leaving good coil in the kidney and a good coil in the bladder.  The bladder was drained.  The cystoscope was removed.  The patient was taken down from lithotomy position.  His anesthetic was reversed.  He was moved to recovery in stable condition.  There were no complications.     Excell Seltzer. Annabell Howells, M.D.     JJW/MEDQ  D:  07/21/2012  T:  07/22/2012  Job:  [id number]

## 2012-07-23 LAB — POCT I-STAT EG7
HCT: 40 % (ref 39.0–52.0)
Hemoglobin: 13.6 g/dL (ref 13.0–17.0)
Potassium: 5.4 mEq/L — ABNORMAL HIGH (ref 3.5–5.1)
Sodium: 143 mEq/L (ref 135–145)
pH, Ven: 7.36 — ABNORMAL HIGH (ref 7.250–7.300)

## 2012-07-26 ENCOUNTER — Ambulatory Visit (HOSPITAL_BASED_OUTPATIENT_CLINIC_OR_DEPARTMENT_OTHER)
Admission: RE | Admit: 2012-07-26 | Discharge: 2012-07-26 | Disposition: A | Discharge status: Home / Self Care | Payer: Medicare Other | Source: Ambulatory Visit | Attending: Otolaryngology | Admitting: Otolaryngology

## 2012-07-26 ENCOUNTER — Encounter (HOSPITAL_BASED_OUTPATIENT_CLINIC_OR_DEPARTMENT_OTHER): Payer: Self-pay

## 2012-07-26 ENCOUNTER — Encounter (HOSPITAL_BASED_OUTPATIENT_CLINIC_OR_DEPARTMENT_OTHER)
Admission: RE | Disposition: A | Discharge status: Home / Self Care | Payer: Self-pay | Source: Ambulatory Visit | Attending: Otolaryngology

## 2012-07-26 DIAGNOSIS — I120 Hypertensive chronic kidney disease with stage 5 chronic kidney disease or end stage renal disease: Secondary | ICD-10-CM | POA: Insufficient documentation

## 2012-07-26 DIAGNOSIS — G4733 Obstructive sleep apnea (adult) (pediatric): Secondary | ICD-10-CM | POA: Insufficient documentation

## 2012-07-26 DIAGNOSIS — I2510 Atherosclerotic heart disease of native coronary artery without angina pectoris: Secondary | ICD-10-CM | POA: Insufficient documentation

## 2012-07-26 DIAGNOSIS — Z79899 Other long term (current) drug therapy: Secondary | ICD-10-CM | POA: Insufficient documentation

## 2012-07-26 DIAGNOSIS — Z992 Dependence on renal dialysis: Secondary | ICD-10-CM | POA: Insufficient documentation

## 2012-07-26 DIAGNOSIS — J449 Chronic obstructive pulmonary disease, unspecified: Secondary | ICD-10-CM | POA: Insufficient documentation

## 2012-07-26 DIAGNOSIS — E1129 Type 2 diabetes mellitus with other diabetic kidney complication: Secondary | ICD-10-CM | POA: Insufficient documentation

## 2012-07-26 DIAGNOSIS — N186 End stage renal disease: Secondary | ICD-10-CM | POA: Insufficient documentation

## 2012-07-26 DIAGNOSIS — J4489 Other specified chronic obstructive pulmonary disease: Secondary | ICD-10-CM | POA: Insufficient documentation

## 2012-07-26 DIAGNOSIS — C44211 Basal cell carcinoma of skin of unspecified ear and external auricular canal: Secondary | ICD-10-CM

## 2012-07-26 DIAGNOSIS — C44201 Unspecified malignant neoplasm of skin of unspecified ear and external auricular canal: Secondary | ICD-10-CM | POA: Insufficient documentation

## 2012-07-26 HISTORY — PX: MASS EXCISION: SHX2000

## 2012-07-26 SURGERY — MINOR EXCISION OF MASS
Anesthesia: LOCAL | Site: Ear | Laterality: Right | Wound class: Clean

## 2012-07-26 MED ORDER — LIDOCAINE-EPINEPHRINE 1 %-1:100000 IJ SOLN
INTRAMUSCULAR | Status: DC | PRN
  Administered 2012-07-26: 1 mL

## 2012-07-26 SURGICAL SUPPLY — 44 items
BLADE SURG 15 STRL LF DISP TIS (BLADE) ×1 IMPLANT
BLADE SURG 15 STRL SS (BLADE) ×1
CANISTER SUCTION 1200CC (MISCELLANEOUS) IMPLANT
CLEANER CAUTERY TIP 5X5 PAD (MISCELLANEOUS) IMPLANT
CLOTH BEACON ORANGE TIMEOUT ST (SAFETY) ×2 IMPLANT
CORDS BIPOLAR (ELECTRODE) IMPLANT
COTTONBALL LRG STERILE PKG (GAUZE/BANDAGES/DRESSINGS) ×2 IMPLANT
COVER MAYO STAND STRL (DRAPES) ×2 IMPLANT
DERMABOND ADVANCED (GAUZE/BANDAGES/DRESSINGS)
DERMABOND ADVANCED .7 DNX12 (GAUZE/BANDAGES/DRESSINGS) IMPLANT
DRAIN PENROSE 1/4X12 LTX STRL (WOUND CARE) IMPLANT
ELECT COATED BLADE 2.86 ST (ELECTRODE) IMPLANT
ELECT REM PT RETURN 9FT ADLT (ELECTROSURGICAL) ×2
ELECTRODE REM PT RTRN 9FT ADLT (ELECTROSURGICAL) ×1 IMPLANT
GAUZE SPONGE 4X4 12PLY STRL LF (GAUZE/BANDAGES/DRESSINGS) IMPLANT
GLOVE BIO SURGEON STRL SZ 6.5 (GLOVE) ×2 IMPLANT
GLOVE BIOGEL PI IND STRL 7.0 (GLOVE) ×1 IMPLANT
GLOVE BIOGEL PI INDICATOR 7.0 (GLOVE) ×1
GLOVE ECLIPSE 6.5 STRL STRAW (GLOVE) ×2 IMPLANT
GLOVE ECLIPSE 7.5 STRL STRAW (GLOVE) ×2 IMPLANT
GOWN PREVENTION PLUS XLARGE (GOWN DISPOSABLE) ×2 IMPLANT
NEEDLE 27GAX1X1/2 (NEEDLE) ×2 IMPLANT
NEEDLE HYPO 25X1 1.5 SAFETY (NEEDLE) IMPLANT
NS IRRIG 1000ML POUR BTL (IV SOLUTION) IMPLANT
PACK BASIN DAY SURGERY FS (CUSTOM PROCEDURE TRAY) ×2 IMPLANT
PAD CLEANER CAUTERY TIP 5X5 (MISCELLANEOUS)
PENCIL FOOT CONTROL (ELECTRODE) IMPLANT
RUBBERBAND STERILE (MISCELLANEOUS) IMPLANT
SHEET MEDIUM DRAPE 40X70 STRL (DRAPES) IMPLANT
SPONGE GAUZE 2X2 8PLY STRL LF (GAUZE/BANDAGES/DRESSINGS) IMPLANT
SUCTION FRAZIER TIP 10 FR DISP (SUCTIONS) IMPLANT
SUT CHROMIC 3 0 PS 2 (SUTURE) IMPLANT
SUT CHROMIC 4 0 P 3 18 (SUTURE) ×2 IMPLANT
SUT ETHILON 4 0 PS 2 18 (SUTURE) ×2 IMPLANT
SUT ETHILON 5 0 P 3 18 (SUTURE)
SUT NYLON ETHILON 5-0 P-3 1X18 (SUTURE) IMPLANT
SUT PLAIN 5 0 P 3 18 (SUTURE) IMPLANT
SUT SILK 4 0 TIES 17X18 (SUTURE) IMPLANT
SUT SURG 6 0 PRE1/P 10 (SUTURE) IMPLANT
SUT VICRYL 4-0 PS2 18IN ABS (SUTURE) IMPLANT
SYR BULB 3OZ (MISCELLANEOUS) IMPLANT
SYR CONTROL 10ML LL (SYRINGE) ×2 IMPLANT
TRAY DSU PREP LF (CUSTOM PROCEDURE TRAY) ×2 IMPLANT
TUBE CONNECTING 20X1/4 (TUBING) ×2 IMPLANT

## 2012-07-26 NOTE — Interval H&P Note (Signed)
History and Physical Interval Note:  07/26/2012 8:09 AM  Antonio Page  has presented today for surgery, with the diagnosis of Left Ear Skin cancer  The various methods of treatment have been discussed with the patient and family. After consideration of risks, benefits and other options for treatment, the patient has consented to  Procedure(s) (LRB) with comments: MINOR EXCISION OF MASS (Left) - Excision of a Left Ear Skin Cancer with Primary Closure as a surgical intervention .  The patient's history has been reviewed, patient examined, no change in status, stable for surgery.  I have reviewed the patient's chart and labs.  Questions were answered to the patient's satisfaction.     Chairty Toman

## 2012-07-26 NOTE — Interval H&P Note (Signed)
History and Physical Interval Note:  07/26/2012 9:03 AM  Antonio Page  has presented today for surgery, with the diagnosis of right Ear Skin cancer (please note the right side is the correct side, disregard the previous note that said left side).  The various methods of treatment have been discussed with the patient and family. After consideration of risks, benefits and other options for treatment, the patient has consented to  Procedure(s) (LRB) with comments: MINOR EXCISION OF MASS (Right) - Excision of a Right Ear Skin Cancer with Primary Closure  wound class per Dr. Pollyann Kennedy  as a surgical intervention .  The patient's history has been reviewed, patient examined, no change in status, stable for surgery.  I have reviewed the patient's chart and labs.  Questions were answered to the patient's satisfaction.     [name]

## 2012-07-26 NOTE — Op Note (Signed)
OPERATIVE REPORT  DATE OF SURGERY: 07/26/2012  PATIENT:  Antonio Page,  71 y.o. male  PRE-OPERATIVE DIAGNOSIS:  Left Ear Skin cancer  POST-OPERATIVE DIAGNOSIS:  * No post-op diagnosis entered *  PROCEDURE:  Procedure(s): MINOR EXCISION OF MASS  SURGEON:  Susy Frizzle, MD  ASSISTANTS: none   ANESTHESIA:   local  EBL:  5 ml  DRAINS: none   LOCAL MEDICATIONS USED:  OTHER 1% Xylocaine with epinephrine  SPECIMEN:  Source of Specimen:  Right external auditory meatus  COUNTS:  YES  PROCEDURE DETAILS: The patient was taken to the operating room and placed on the operating table in the supine position. The right external ear was prepped and draped in a standard fashion. Using the operating microscope on low power, the external auditory meatus was inspected. The area of the previous biopsy was identified and there is a very small residual skin abnormality in this area. The entire area from the biopsy was outlined with a marking pen, approximately 12 mm in diameter. This involved the posterior surface of the tragus and the anterior auditory meatal skin. Local anesthetic was infiltrated around the marks. A 15 scalpel was used to incise the skin and to excise the entire lesion. The anterior margin was marked with a nylon suture. I was able to get a single suture and inferiorly to reapproximate some of the edges but the rest of the wound was left open as there was insufficient skin for closure. This will granulate in and heal secondarily. Silver nitrate cautery was used to stop a tiny amount of bleeding. The ear canal and meatus was cleaned of blood and packed with a cotton ball with bacitracin ointment.   PATIENT DISPOSITION:  PACU - hemodynamically stable.

## 2012-07-27 ENCOUNTER — Encounter (HOSPITAL_BASED_OUTPATIENT_CLINIC_OR_DEPARTMENT_OTHER): Payer: Self-pay

## 2012-07-27 ENCOUNTER — Encounter (HOSPITAL_BASED_OUTPATIENT_CLINIC_OR_DEPARTMENT_OTHER): Payer: Self-pay | Admitting: Otolaryngology

## 2012-12-28 ENCOUNTER — Encounter (HOSPITAL_COMMUNITY): Payer: Self-pay

## 2012-12-28 ENCOUNTER — Emergency Department (HOSPITAL_COMMUNITY): Payer: Medicare Other

## 2012-12-28 ENCOUNTER — Emergency Department (HOSPITAL_COMMUNITY)
Admission: EM | Admit: 2012-12-28 | Discharge: 2012-12-28 | Disposition: A | Discharge status: Home / Self Care | Payer: Medicare Other | Attending: Emergency Medicine | Admitting: Emergency Medicine

## 2012-12-28 DIAGNOSIS — E119 Type 2 diabetes mellitus without complications: Secondary | ICD-10-CM | POA: Insufficient documentation

## 2012-12-28 DIAGNOSIS — Z992 Dependence on renal dialysis: Secondary | ICD-10-CM | POA: Insufficient documentation

## 2012-12-28 DIAGNOSIS — K219 Gastro-esophageal reflux disease without esophagitis: Secondary | ICD-10-CM | POA: Insufficient documentation

## 2012-12-28 DIAGNOSIS — R110 Nausea: Secondary | ICD-10-CM | POA: Insufficient documentation

## 2012-12-28 DIAGNOSIS — Z87442 Personal history of urinary calculi: Secondary | ICD-10-CM | POA: Insufficient documentation

## 2012-12-28 DIAGNOSIS — N186 End stage renal disease: Secondary | ICD-10-CM | POA: Insufficient documentation

## 2012-12-28 DIAGNOSIS — Z7982 Long term (current) use of aspirin: Secondary | ICD-10-CM | POA: Insufficient documentation

## 2012-12-28 DIAGNOSIS — G4730 Sleep apnea, unspecified: Secondary | ICD-10-CM | POA: Insufficient documentation

## 2012-12-28 DIAGNOSIS — H5440 Blindness, one eye, unspecified eye: Secondary | ICD-10-CM | POA: Insufficient documentation

## 2012-12-28 DIAGNOSIS — H409 Unspecified glaucoma: Secondary | ICD-10-CM | POA: Insufficient documentation

## 2012-12-28 DIAGNOSIS — Z87891 Personal history of nicotine dependence: Secondary | ICD-10-CM | POA: Insufficient documentation

## 2012-12-28 DIAGNOSIS — Z79899 Other long term (current) drug therapy: Secondary | ICD-10-CM | POA: Insufficient documentation

## 2012-12-28 DIAGNOSIS — M129 Arthropathy, unspecified: Secondary | ICD-10-CM | POA: Insufficient documentation

## 2012-12-28 DIAGNOSIS — R109 Unspecified abdominal pain: Secondary | ICD-10-CM | POA: Insufficient documentation

## 2012-12-28 DIAGNOSIS — Z794 Long term (current) use of insulin: Secondary | ICD-10-CM | POA: Insufficient documentation

## 2012-12-28 DIAGNOSIS — I120 Hypertensive chronic kidney disease with stage 5 chronic kidney disease or end stage renal disease: Secondary | ICD-10-CM | POA: Insufficient documentation

## 2012-12-28 DIAGNOSIS — Z9981 Dependence on supplemental oxygen: Secondary | ICD-10-CM | POA: Insufficient documentation

## 2012-12-28 LAB — CBC WITH DIFFERENTIAL/PLATELET
Basophils Relative: 0 % (ref 0–1)
Eosinophils Absolute: 0.1 10*3/uL (ref 0.0–0.7)
Hemoglobin: 11.7 g/dL — ABNORMAL LOW (ref 13.0–17.0)
MCH: 29.5 pg (ref 26.0–34.0)
MCHC: 32.4 g/dL (ref 30.0–36.0)
Monocytes Relative: 5 % (ref 3–12)
Neutrophils Relative %: 81 % — ABNORMAL HIGH (ref 43–77)
Platelets: 111 10*3/uL — ABNORMAL LOW (ref 150–400)

## 2012-12-28 LAB — URINALYSIS, MICROSCOPIC ONLY
Glucose, UA: 100 mg/dL — AB
Ketones, ur: NEGATIVE mg/dL
Leukocytes, UA: NEGATIVE
Specific Gravity, Urine: 1.017 (ref 1.005–1.030)
pH: 7 (ref 5.0–8.0)

## 2012-12-28 LAB — COMPREHENSIVE METABOLIC PANEL
Albumin: 3.9 g/dL (ref 3.5–5.2)
Alkaline Phosphatase: 122 U/L — ABNORMAL HIGH (ref 39–117)
BUN: 35 mg/dL — ABNORMAL HIGH (ref 6–23)
Calcium: 9.8 mg/dL (ref 8.4–10.5)
Potassium: 3.9 mEq/L (ref 3.5–5.1)
Total Protein: 7 g/dL (ref 6.0–8.3)

## 2012-12-28 MED ORDER — HYDROMORPHONE HCL PF 1 MG/ML IJ SOLN
0.5000 mg | Freq: Once | INTRAMUSCULAR | Status: AC
  Administered 2012-12-28: 22:00:00 via INTRAVENOUS
  Filled 2012-12-28: qty 1

## 2012-12-28 MED ORDER — OXYCODONE-ACETAMINOPHEN 5-325 MG PO TABS: 1.0000 | ORAL_TABLET | Freq: Four times a day (QID) | ORAL | Status: DC | PRN

## 2012-12-28 MED ORDER — ONDANSETRON HCL 4 MG/2ML IJ SOLN
4.0000 mg | Freq: Once | INTRAMUSCULAR | Status: AC
  Administered 2012-12-28: 4 mg via INTRAVENOUS
  Filled 2012-12-28: qty 2

## 2012-12-28 MED ORDER — HYDROMORPHONE HCL PF 1 MG/ML IJ SOLN
0.5000 mg | Freq: Once | INTRAMUSCULAR | Status: AC
  Administered 2012-12-28: 0.5 mg via INTRAVENOUS
  Filled 2012-12-28: qty 1

## 2012-12-28 NOTE — ED Notes (Signed)
Unable to get IV access on pt. IV RN paged.

## 2012-12-28 NOTE — ED Notes (Signed)
IV RN paged again

## 2012-12-28 NOTE — ED Notes (Signed)
Pt reports not been feeling well for several days, lower back pain started today. Hx of kidney stones and kidney blockages. Pain 8/10. Nausea, no vomiting.

## 2012-12-28 NOTE — ED Provider Notes (Addendum)
History     CSN: [id number]  Arrival date & time 12/28/12  1827   First MD Initiated Contact with Patient 12/28/12 2007      Chief Complaint  Patient presents with  . Flank Pain    (Consider location/radiation/quality/duration/timing/severity/associated sxs/prior treatment) Patient is a 72 y.o. male presenting with flank pain. The history is provided by the patient.  Flank Pain This is a new problem. The current episode started 12 to 24 hours ago. The problem occurs constantly. The problem has not changed since onset.Associated symptoms include abdominal pain. Pertinent negatives include no chest pain and no shortness of breath. Associated symptoms comments: Left flank pain that started this am and has been waxing and waning with nausea but no vomiting.  Pt denies diarrhea or dysuria.  States it feels like his prior kidney stones. Nothing aggravates the symptoms. Nothing relieves the symptoms. He has tried nothing for the symptoms. The treatment provided no relief.    Past Medical History  Diagnosis Date  . Hypertension   . Sleep apnea     uses a cpap  . Diabetes mellitus   . GERD (gastroesophageal reflux disease)   . Arthritis   . Glaucoma   . Dialysis patient   . Blind right eye   . Chronic kidney disease     dialysis  . Kidney stones     Past Surgical History  Procedure Laterality Date  . Dg av dialysis  shunt access exist*l* or  2010    left upper arm-not using  . Dg av dialysis  shunt access exist*l* or  2012    right upper arm-using this one  . Shoulder arthroscopy      right  . Knee arthroscopy      left  . Tonsillectomy    . Eye surgery      multiple rt eye surgeries  . Coronary stent placement    . Trigger finger release  06/08/2012    Procedure: RELEASE TRIGGER FINGER/A-1 PULLEY;  Surgeon: Wyn Forster., MD;  Location: [facility];  Service: Orthopedics;  Laterality: Right;  . Dupuytren contracture release  06/08/2012    Procedure:  DUPUYTREN CONTRACTURE RELEASE;  Surgeon: Wyn Forster., MD;  Location: [facility];  Service: Orthopedics;  Laterality: Right;  right ring fascia excision with A1 Pulley right ring  . Cystoscopy with retrograde pyelogram, ureteroscopy and stent placement  07/21/2012    Procedure: CYSTOSCOPY WITH RETROGRADE PYELOGRAM, URETEROSCOPY AND STENT PLACEMENT;  Surgeon: Anner Crete, MD;  Location: [facility];  Service: Urology;  Laterality: Right;  . Mass excision  07/26/2012    Procedure: MINOR EXCISION OF MASS;  Surgeon: Serena Colonel, MD;  Location: [facility];  Service: ENT;  Laterality: Right;  Excision of a Right Ear Skin Cancer with Primary Closure  wound class per Dr. Pollyann Kennedy     History reviewed. No pertinent family history.  History  Substance Use Topics  . Smoking status: Former Smoker    Quit date: 06/08/1995  . Smokeless tobacco: Not on file  . Alcohol Use: No      Review of Systems  Constitutional: Negative for fever.  Respiratory: Negative for shortness of breath.   Cardiovascular: Negative for chest pain.  Gastrointestinal: Positive for abdominal pain.  Genitourinary: Positive for flank pain.  All other systems reviewed and are negative.    Allergies  Review of patient's allergies indicates no known allergies.  Home Medications   Current Outpatient  Rx  Name  Route  Sig  Dispense  Refill  . allopurinol (ZYLOPRIM) 300 MG tablet   Oral   Take 300 mg by mouth daily. Takes 1/2         . amLODipine (NORVASC) 10 MG tablet   Oral   Take 10 mg by mouth at bedtime.         Marland Kitchen aspirin 81 MG tablet   Oral   Take 81 mg by mouth daily.         . fexofenadine (ALLEGRA) 180 MG tablet   Oral   Take 180 mg by mouth daily.         . hydrALAZINE (APRESOLINE) 50 MG tablet   Oral   Take 50 mg by mouth 3 (three) times daily.         Marland Kitchen HYDROcodone-acetaminophen (NORCO) 5-325 MG per tablet   Oral   Take 1 tablet by mouth every 6 (six) hours  as needed for pain.   30 tablet   0   . insulin NPH-insulin regular (NOVOLIN 70/30) (70-30) 100 UNIT/ML injection   Subcutaneous   Inject 40 Units into the skin 2 (two) times daily with a meal.         . multivitamin (RENA-VIT) TABS tablet   Oral   Take 1 tablet by mouth daily.         Marland Kitchen omeprazole (PRILOSEC) 20 MG capsule   Oral   Take 20 mg by mouth daily.         . polyethylene glycol (MIRALAX / GLYCOLAX) packet   Oral   Take 17 g by mouth daily.         . traMADol (ULTRAM) 50 MG tablet      1 tab every 4 hours as needed for pain   20 tablet   0     BP 164/72  Pulse 73  Temp(Src) 97.8 F (36.6 C) (Oral)  Resp 16  SpO2 98%  Physical Exam  Nursing note and vitals reviewed. Constitutional: He is oriented to person, place, and time. He appears well-developed and well-nourished. No distress.  HENT:  Head: Normocephalic and atraumatic.  Mouth/Throat: Oropharynx is clear and moist.  Eyes: Conjunctivae and EOM are normal. Pupils are equal, round, and reactive to light.  Neck: Normal range of motion. Neck supple.  Cardiovascular: Normal rate, regular rhythm and intact distal pulses.   No murmur heard. Pulmonary/Chest: Effort normal and breath sounds normal. No respiratory distress. He has no wheezes. He has no rales.  Abdominal: Soft. He exhibits no distension. There is tenderness. There is no rebound, no guarding and no CVA tenderness.  protruberant abd with mild tenderness diffusely  Musculoskeletal: Normal range of motion. He exhibits edema. He exhibits no tenderness.  Patent fistula in Right forearm.  1+ edema in bilateral lower ext  Neurological: He is alert and oriented to person, place, and time.  Skin: Skin is warm and dry. No rash noted. No erythema.  Psychiatric: He has a normal mood and affect. His behavior is normal.    ED Course  Procedures (including critical care time)  Labs Reviewed  CBC WITH DIFFERENTIAL - Abnormal; Notable for the  following:    RBC 3.96 (*)    Hemoglobin 11.7 (*)    HCT 36.1 (*)    RDW 15.7 (*)    Platelets 111 (*)    Neutrophils Relative 81 (*)    All other components within normal limits  COMPREHENSIVE METABOLIC PANEL - Abnormal; Notable  for the following:    Glucose, Bld 106 (*)    BUN 35 (*)    Creatinine, Ser 5.68 (*)    Alkaline Phosphatase 122 (*)    GFR calc non Af Amer 9 (*)    GFR calc Af Amer 10 (*)    All other components within normal limits  URINALYSIS, MICROSCOPIC ONLY - Abnormal; Notable for the following:    APPearance CLOUDY (*)    Glucose, UA 100 (*)    Hgb urine dipstick MODERATE (*)    Protein, ur >300 (*)    Bacteria, UA FEW (*)    Squamous Epithelial / LPF FEW (*)    All other components within normal limits   Ct Abdomen Pelvis Wo Contrast  12/28/2012  *RADIOLOGY REPORT*  Clinical Data: Abdominal pain, history of kidney stones  CT ABDOMEN AND PELVIS WITHOUT CONTRAST  Technique:  Multidetector CT imaging of the abdomen and pelvis was performed following the standard protocol without intravenous contrast.  Comparison: 09/14/2012 ultrasound, 07/21/2012 and 02/19/2010 CT  Findings: Coronary artery calcification.  Heart size mildly enlarged.  Linear left lung base opacity, favor atelectasis or scarring.  Organ abnormality/lesion detection is limited in the absence of intravenous contrast. Within this limitation, 3.5 cm round hypodensity within segment two is nonspecific, unchanged from priors. Otherwise, areas of hypoattenuation suggests fatty infiltration. Unremarkable pancreas and biliary system.  Splenomegaly, measuring 15.9 cm craniocaudal.  Unremarkable adrenal glands.  Symmetric renal size.  There is mild left perinephric fat stranding and periureteral fat stranding/ mid ureteral thickening.  No obstructing lesion identified.  Bilateral renal parenchymal atrophy.  No urinary tract calculi identified.  No CT evidence for colitis.  Appendix not identified.  No right lower  quadrant inflammation.  Small bowel loops are normal course and caliber.  No free intraperitoneal air or fluid.  Prominent porta hepatis lymph nodes up to 1.5 cm short axis. Prominent retroperitoneal lymph nodes, as index, measuring up to 1.2 cm short axis anterior to the left psoas on image 65.  Decompressed bladder.  Prostatomegaly.  Fat containing inguinal hernias and/or spermatic cord lipomas.  Multilevel degenerative changes of the imaged spine. Endplate irregularity along the inferior plate L2 and superior endplate L4, similar to prior.  IMPRESSION: Mild left perinephric fat stranding and periureteral fat stranding. May reflect an ascending infection.  Correlate with urinalysis. Consider follow-up CT urogram (to include excretory phase) to exclude an underlying lesion.  Nonspecific splenomegaly and mildly prominent porta hepatis and retroperitoneal lymph nodes. Differential includes lymphoma. However, the lack of significant change since 2011 argues against an aggressive process.  Nonspecific left hepatic lobe lesion, unchanged from 2011.   Original Report Authenticated By: Jearld Lesch, M.D.      1. Acute left flank pain       MDM   Pt with symptoms consistent with kidney stone.  Denies infectious sx, or GI symptoms.  Low concern for diverticulitis and prior hx of stones and ureteral obstruction s/p stent on the right.  No hx suggestive of GU source (discharge).  Pt is dialysis pt but dialyzed yesterday and no SOB or CP.  At this point can't r/o AAA however will get CT to eval for stone and this will also show AAA.   Will treat pain and ensure no infection with UA, CBC, CMP and will get stone study to further eval.   8:26 PM UA positive for blood. T. positive perinephric stranding but no identified stones. Spoke with radiologist and he states it  could be that this is a translucent stone however other source of obstruction cannot be ruled out. Patient has no sign of infection in his urine  and his white blood cell count is within normal limits.   Patient's pain is now controlled and he will follow up with Dr. Annabell Howells tomorrow        Gwyneth Sprout, MD 12/28/12 [id number]  Gwyneth Sprout, MD 12/28/12 [id number]

## 2012-12-28 NOTE — ED Notes (Signed)
Unsuccessful lab draw by this writer at this time.

## 2012-12-28 NOTE — ED Notes (Signed)
RN to obtain labs with IV start

## 2013-02-07 ENCOUNTER — Other Ambulatory Visit (HOSPITAL_COMMUNITY): Payer: Self-pay | Admitting: Nephrology

## 2013-02-07 DIAGNOSIS — N186 End stage renal disease: Secondary | ICD-10-CM

## 2013-02-08 ENCOUNTER — Encounter (HOSPITAL_COMMUNITY): Payer: Self-pay | Admitting: Pharmacy Technician

## 2013-02-10 ENCOUNTER — Ambulatory Visit (HOSPITAL_COMMUNITY): Admission: RE | Admit: 2013-02-10 | Payer: Medicare Other | Source: Ambulatory Visit

## 2013-03-30 ENCOUNTER — Other Ambulatory Visit: Payer: Self-pay | Admitting: *Deleted

## 2013-03-30 ENCOUNTER — Telehealth: Payer: Self-pay | Admitting: Cardiovascular Disease

## 2013-03-30 DIAGNOSIS — Z01818 Encounter for other preprocedural examination: Secondary | ICD-10-CM

## 2013-03-30 NOTE — Telephone Encounter (Signed)
Dr Royann Shivers will proceed with a TEE.  I will place orders

## 2013-03-30 NOTE — Telephone Encounter (Signed)
I spoke with Dr. Royann Shivers and he stated to schedule TEE ASAP.  Procedure is scheduled for 03/31/2013 @ 8 am   Arrival time 6:45 am.  I spoke with Mrs. Zeimet and she voiced understanding about date, time and instructions for the procedure.  I also spoke with Diane at the [facility].

## 2013-03-30 NOTE — Telephone Encounter (Signed)
Mr. Puertas is at the [facility] today and Dr. Lacy Duverney wants the patient to be scheduled for an outpatient TEE due to cp and fever.  Please advise

## 2013-03-31 ENCOUNTER — Encounter (HOSPITAL_COMMUNITY)
Admission: RE | Disposition: A | Discharge status: Home / Self Care | Payer: Self-pay | Source: Ambulatory Visit | Attending: Cardiovascular Disease

## 2013-03-31 ENCOUNTER — Ambulatory Visit (HOSPITAL_COMMUNITY)
Admission: RE | Admit: 2013-03-31 | Discharge: 2013-03-31 | Disposition: A | Discharge status: Home / Self Care | Payer: Medicare Other | Source: Ambulatory Visit | Attending: Cardiovascular Disease | Admitting: Cardiovascular Disease

## 2013-03-31 ENCOUNTER — Encounter (HOSPITAL_COMMUNITY): Payer: Self-pay | Admitting: *Deleted

## 2013-03-31 DIAGNOSIS — E119 Type 2 diabetes mellitus without complications: Secondary | ICD-10-CM

## 2013-03-31 DIAGNOSIS — N186 End stage renal disease: Secondary | ICD-10-CM | POA: Insufficient documentation

## 2013-03-31 DIAGNOSIS — G4730 Sleep apnea, unspecified: Secondary | ICD-10-CM | POA: Insufficient documentation

## 2013-03-31 DIAGNOSIS — A4901 Methicillin susceptible Staphylococcus aureus infection, unspecified site: Secondary | ICD-10-CM

## 2013-03-31 DIAGNOSIS — Z992 Dependence on renal dialysis: Secondary | ICD-10-CM | POA: Insufficient documentation

## 2013-03-31 DIAGNOSIS — Z79899 Other long term (current) drug therapy: Secondary | ICD-10-CM | POA: Insufficient documentation

## 2013-03-31 DIAGNOSIS — E1310 Other specified diabetes mellitus with ketoacidosis without coma: Secondary | ICD-10-CM | POA: Insufficient documentation

## 2013-03-31 DIAGNOSIS — H409 Unspecified glaucoma: Secondary | ICD-10-CM | POA: Insufficient documentation

## 2013-03-31 DIAGNOSIS — Z794 Long term (current) use of insulin: Secondary | ICD-10-CM | POA: Insufficient documentation

## 2013-03-31 DIAGNOSIS — Z9861 Coronary angioplasty status: Secondary | ICD-10-CM | POA: Insufficient documentation

## 2013-03-31 DIAGNOSIS — Z7982 Long term (current) use of aspirin: Secondary | ICD-10-CM | POA: Insufficient documentation

## 2013-03-31 DIAGNOSIS — K219 Gastro-esophageal reflux disease without esophagitis: Secondary | ICD-10-CM | POA: Insufficient documentation

## 2013-03-31 DIAGNOSIS — R509 Fever, unspecified: Secondary | ICD-10-CM

## 2013-03-31 DIAGNOSIS — I517 Cardiomegaly: Secondary | ICD-10-CM | POA: Insufficient documentation

## 2013-03-31 DIAGNOSIS — I10 Essential (primary) hypertension: Secondary | ICD-10-CM

## 2013-03-31 DIAGNOSIS — R7881 Bacteremia: Secondary | ICD-10-CM

## 2013-03-31 DIAGNOSIS — M129 Arthropathy, unspecified: Secondary | ICD-10-CM | POA: Insufficient documentation

## 2013-03-31 DIAGNOSIS — B9561 Methicillin susceptible Staphylococcus aureus infection as the cause of diseases classified elsewhere: Secondary | ICD-10-CM

## 2013-03-31 DIAGNOSIS — Z87891 Personal history of nicotine dependence: Secondary | ICD-10-CM | POA: Insufficient documentation

## 2013-03-31 DIAGNOSIS — I120 Hypertensive chronic kidney disease with stage 5 chronic kidney disease or end stage renal disease: Secondary | ICD-10-CM | POA: Insufficient documentation

## 2013-03-31 DIAGNOSIS — I700 Atherosclerosis of aorta: Secondary | ICD-10-CM | POA: Insufficient documentation

## 2013-03-31 DIAGNOSIS — R0602 Shortness of breath: Secondary | ICD-10-CM

## 2013-03-31 DIAGNOSIS — H5440 Blindness, one eye, unspecified eye: Secondary | ICD-10-CM | POA: Insufficient documentation

## 2013-03-31 DIAGNOSIS — Z01818 Encounter for other preprocedural examination: Secondary | ICD-10-CM

## 2013-03-31 DIAGNOSIS — N184 Chronic kidney disease, stage 4 (severe): Secondary | ICD-10-CM

## 2013-03-31 HISTORY — PX: TEE WITHOUT CARDIOVERSION: SHX5443

## 2013-03-31 SURGERY — ECHOCARDIOGRAM, TRANSESOPHAGEAL
Anesthesia: Moderate Sedation

## 2013-03-31 MED ORDER — MIDAZOLAM HCL 5 MG/ML IJ SOLN
INTRAMUSCULAR | Status: AC
  Filled 2013-03-31: qty 3

## 2013-03-31 MED ORDER — MIDAZOLAM HCL 10 MG/2ML IJ SOLN
INTRAMUSCULAR | Status: DC | PRN
  Administered 2013-03-31: 1 mg via INTRAVENOUS
  Administered 2013-03-31: 2 mg via INTRAVENOUS

## 2013-03-31 MED ORDER — FENTANYL CITRATE 0.05 MG/ML IJ SOLN
INTRAMUSCULAR | Status: AC
  Filled 2013-03-31: qty 2

## 2013-03-31 MED ORDER — SODIUM CHLORIDE 0.9 % IV SOLN
INTRAVENOUS | Status: DC
Start: 2013-03-31 — End: 2013-03-31
  Administered 2013-03-31: 500 mL via INTRAVENOUS

## 2013-03-31 MED ORDER — LIDOCAINE VISCOUS 2 % MT SOLN
OROMUCOSAL | Status: AC
  Filled 2013-03-31: qty 15

## 2013-03-31 MED ORDER — FENTANYL CITRATE 0.05 MG/ML IJ SOLN
INTRAMUSCULAR | Status: DC | PRN
  Administered 2013-03-31: 25 ug via INTRAVENOUS

## 2013-03-31 MED ORDER — BUTAMBEN-TETRACAINE-BENZOCAINE 2-2-14 % EX AERO
INHALATION_SPRAY | CUTANEOUS | Status: DC | PRN
  Administered 2013-03-31: 2 via TOPICAL

## 2013-03-31 NOTE — Op Note (Signed)
INDICATIONS: infective endocarditis  PROCEDURE:   Informed consent was obtained prior to the procedure. The risks, benefits and alternatives for the procedure were discussed and the patient comprehended these risks.  Risks include, but are not limited to, cough, sore throat, vomiting, nausea, somnolence, esophageal and stomach trauma or perforation, bleeding, low blood pressure, aspiration, pneumonia, infection, trauma to the teeth and death.    After a procedural time-out, the oropharynx was anesthetized with 20% benzocaine spray. The patient was given 3 mg versed and 25 mcg fentanyl for moderate sedation.   The transesophageal probe was inserted in the esophagus and stomach without difficulty and multiple views were obtained.  The patient was kept under observation until the patient left the procedure room.  The patient left the procedure room in stable condition.   Agitated microbubble saline contrast was not administered.  COMPLICATIONS:    There were no immediate complications.  FINDINGS:  No evidence of endocarditis. All valves are normal. Normal LV function  RECOMMENDATIONS:     Evaluate for alternative sources of bacteriemia  Time Spent Directly with the Patient:    Antonio Page 03/31/2013, 9:28 AM

## 2013-03-31 NOTE — H&P (Signed)
Chief Complaint:  Fever and Malaise  HPI: The patient is a 72 y/o male, who was referred by Dr. Kathrene Bongo for transesophageal echocardiography in the setting of fever and recent history of staph positive blood cultures. No prior cardiac history. He has HTN, DM and CKD, on HD at [facility]. The patient reported first spiking a fever of ~104 while on vacation in [place] July 4. Work-up at an area hospital there revealed staph positive blood cultures. He was given antibiotics. Follow-up blood cultures here in [place] several days afterwards were negative. However, the patient spiked another fever of 102 2 days ago. He endorses generalized malaise, fatigue and chills. No SOB. He only notes chest pain under the right mid vertebral border when he coughs.    Past Medical History  Diagnosis Date  . Hypertension   . Sleep apnea     uses a cpap  . Diabetes mellitus   . GERD (gastroesophageal reflux disease)   . Arthritis   . Glaucoma   . Dialysis patient   . Blind right eye   . Chronic kidney disease     dialysis  . Kidney stones     Past Surgical History  Procedure Laterality Date  . Dg av dialysis  shunt access exist*l* or  2010    left upper arm-not using  . Dg av dialysis  shunt access exist*l* or  2012    right upper arm-using this one  . Shoulder arthroscopy      right  . Knee arthroscopy      left  . Tonsillectomy    . Eye surgery      multiple rt eye surgeries  . Coronary stent placement    . Trigger finger release  06/08/2012    Procedure: RELEASE TRIGGER FINGER/A-1 PULLEY;  Surgeon: Wyn Forster., MD;  Location: [facility];  Service: Orthopedics;  Laterality: Right;  . Dupuytren contracture release  06/08/2012    Procedure: DUPUYTREN CONTRACTURE RELEASE;  Surgeon: Wyn Forster., MD;  Location: [facility];  Service: Orthopedics;  Laterality: Right;  right ring fascia excision with A1 Pulley right ring  .  Cystoscopy with retrograde pyelogram, ureteroscopy and stent placement  07/21/2012    Procedure: CYSTOSCOPY WITH RETROGRADE PYELOGRAM, URETEROSCOPY AND STENT PLACEMENT;  Surgeon: Anner Crete, MD;  Location: [facility];  Service: Urology;  Laterality: Right;  . Mass excision  07/26/2012    Procedure: MINOR EXCISION OF MASS;  Surgeon: Serena Colonel, MD;  [facility];  Service: ENT;  Laterality: Right;  Excision of a Right Ear Skin Cancer with Primary Closure  wound class per Dr. Pollyann Kennedy     Family History  Problem Relation Age of Onset  . Heart disease Father    Social History:  reports that he quit smoking about 17 years ago. He does not have any smokeless tobacco history on file. He reports that he does not drink alcohol or use illicit drugs.  Allergies: No Known Allergies  Medications Prior to Admission  Medication Sig Dispense Refill  . allopurinol (ZYLOPRIM) 300 MG tablet Take 150 mg by mouth daily.       Marland Kitchen aspirin EC 81 MG tablet Take 81 mg by mouth daily.      . calcium carbonate (TUMS - DOSED IN MG ELEMENTAL CALCIUM) 500 MG chewable tablet Chew 2 tablets by mouth 3 (three) times daily with meals.      . fexofenadine (ALLEGRA) 180  MG tablet Take 180 mg by mouth daily.      . hydrALAZINE (APRESOLINE) 50 MG tablet Take 50 mg by mouth daily as needed (for high blood pressure).      . insulin NPH-insulin regular (NOVOLIN 70/30) (70-30) 100 UNIT/ML injection Inject 40 Units into the skin daily with breakfast.       . omeprazole (PRILOSEC) 20 MG capsule Take 20 mg by mouth daily.      . sodium chloride (OCEAN) 0.65 % SOLN nasal spray Place 1 spray into the nose daily as needed for congestion.      Marland Kitchen amLODipine (NORVASC) 10 MG tablet Take 10 mg by mouth at bedtime.      Marland Kitchen b complex-vitamin c-folic acid (NEPHRO-VITE) 0.8 MG TABS Take 0.8 mg by mouth daily.      . cefdinir (OMNICEF) 300 MG capsule Take 300 mg by mouth 2 (two) times daily. Takes for 7 days. Started 02/05/2013.       Marland Kitchen guaiFENesin (MUCINEX) 600 MG 12 hr tablet Take 600 mg by mouth 2 (two) times daily.      Marland Kitchen guaifenesin (ROBITUSSIN CHEST CONGESTION) 100 MG/5ML syrup Take 200 mg by mouth 3 (three) times daily as needed for cough.      . losartan (COZAAR) 100 MG tablet Take 100 mg by mouth at bedtime.      . polyethylene glycol (MIRALAX / GLYCOLAX) packet Take 17 g by mouth daily. Does not take on days of dialysis.        No results found for this or any previous visit (from the past 48 hour(s)). No results found.  Review of Systems  Constitutional: Positive for fever, chills and malaise/fatigue.  Respiratory: Negative for shortness of breath.   Cardiovascular: Positive for chest pain.  All other systems reviewed and are negative.    Blood pressure 156/41, pulse 57, temperature 99.6 F (37.6 C), resp. rate 16, height 5\' 11"  (1.803 m), weight 296 lb (134.265 kg), SpO2 89.00%. Physical Exam  Constitutional: He appears well-developed and well-nourished. No distress.  Neck: Carotid bruit is not present.  Cardiovascular: Normal rate, regular rhythm and intact distal pulses.  Exam reveals no gallop and no friction rub.   Murmur (1/6 murmur) heard. Pulses:      Radial pulses are 2+ on the right side, and 2+ on the left side.       Dorsalis pedis pulses are 2+ on the right side, and 2+ on the left side.  Respiratory: Effort normal and breath sounds normal. No respiratory distress. He has no wheezes. He has no rales.  GI: Soft. Bowel sounds are normal. He exhibits distension. He exhibits no mass. There is no tenderness.  Musculoskeletal: He exhibits no edema.  Skin: Skin is warm and dry. He is not diaphoretic.  Psychiatric: He has a normal mood and affect. His behavior is normal.     Assessment/Plan Active Problems:   Fever, unspecified   Chronic kidney disease (CKD), stage IV (severe)   DM (diabetes mellitus) type 2, uncontrolled, with ketoacidosis   HTN (hypertension)  Plan: Plan for TEE  cardioversion with Dr. Royann Shivers to rule out endocarditis.   Allayne Butcher, PA-C 03/31/2013, 8:36 AM   I have seen and examined the patient along with BRITTAINY M. SIMMONS, PA-C.  I have reviewed the chart, notes and new data.  I agree with PA's note.  Unexplained staph bacteriemia in a dialysis patient at high risk for endocarditis. TEE is indicated. This procedure has been fully  reviewed with the patient and written informed consent has been obtained.  Thurmon Fair, MD, [facility] [phone] 03/31/2013, 9:32 AM

## 2013-03-31 NOTE — Progress Notes (Signed)
*  PRELIMINARY RESULTS* Echocardiogram Echocardiogram Transesophageal has been performed.  Antonio Page 03/31/2013, 10:27 AM

## 2013-04-04 ENCOUNTER — Encounter (HOSPITAL_COMMUNITY): Payer: Self-pay | Admitting: Cardiovascular Disease

## 2013-04-14 ENCOUNTER — Ambulatory Visit (INDEPENDENT_AMBULATORY_CARE_PROVIDER_SITE_OTHER): Payer: Medicare Other | Admitting: Cardiovascular Disease

## 2013-04-14 ENCOUNTER — Ambulatory Visit: Payer: Medicare Other | Admitting: Cardiovascular Disease

## 2013-04-14 ENCOUNTER — Encounter: Payer: Self-pay | Admitting: Cardiovascular Disease

## 2013-04-14 VITALS — BP 132/84 | HR 64 | Ht 71.0 in | Wt 282.0 lb

## 2013-04-14 DIAGNOSIS — I2510 Atherosclerotic heart disease of native coronary artery without angina pectoris: Secondary | ICD-10-CM

## 2013-04-14 DIAGNOSIS — I10 Essential (primary) hypertension: Secondary | ICD-10-CM

## 2013-04-14 NOTE — Progress Notes (Signed)
04/14/2013 Antonio Page   [birth year]  [id number]  Primary Physician Minda Meo, MD Primary Cardiologist: Runell Gess MD Roseanne Reno   HPI:  The patient is a 72 year old, mildly overweight, married Caucasian male, father of 78, grandfather to 8 grandchildren, who I last saw in the office July 30, 2010. He has a history of CAD/stenting of a large nondominant right coronary artery by myself in 1999. He had normal LV function and normal left system at that time. There was a question of "sarcoidosis" by biopsy performed by Dr. Dewayne Shorter in the past. His other problems include hypertension, diabetes and chronic renal insufficiency. He has been on dialysis for the last 4 months, now through a Shiley catheter. He had a failed left upper extremity AV fistula and now a maturing right one. He feels clinically improved on dialysis. I last saw him in the office 2 years ago. He's had his AV fistulas intervened on by a renal interventionalist in the outpatient setting recently. He also had staph bacteremia and underwent transesophageal echo  by Dr. Royann Shivers  revealing no evidence of endocarditis.    Current Outpatient Prescriptions  Medication Sig Dispense Refill  . allopurinol (ZYLOPRIM) 300 MG tablet Take 150 mg by mouth daily.       Marland Kitchen amLODipine (NORVASC) 10 MG tablet Take 10 mg by mouth at bedtime.      Marland Kitchen aspirin EC 81 MG tablet Take 81 mg by mouth daily.      . calcium carbonate (TUMS - DOSED IN MG ELEMENTAL CALCIUM) 500 MG chewable tablet Chew 2 tablets by mouth 3 (three) times daily with meals.      . CeFAZolin Sodium (ANCEF IV) Inject 2 g into the vein. Take three days a week      . fexofenadine (ALLEGRA) 180 MG tablet Take 180 mg by mouth daily.      Marland Kitchen guaifenesin (ROBITUSSIN CHEST CONGESTION) 100 MG/5ML syrup Take 200 mg by mouth 3 (three) times daily as needed for cough.      . hydrALAZINE (APRESOLINE) 50 MG tablet Take 50 mg by mouth daily as needed (for high blood  pressure).      . insulin NPH-insulin regular (NOVOLIN 70/30) (70-30) 100 UNIT/ML injection Inject 40 Units into the skin daily with breakfast.       . losartan (COZAAR) 100 MG tablet Take 100 mg by mouth at bedtime.      Marland Kitchen omeprazole (PRILOSEC) 20 MG capsule Take 20 mg by mouth daily.      . polyethylene glycol (MIRALAX / GLYCOLAX) packet Take 17 g by mouth daily. Does not take on days of dialysis.      Marland Kitchen sodium chloride (OCEAN) 0.65 % SOLN nasal spray Place 1 spray into the nose daily as needed for congestion.      Marland Kitchen b complex-vitamin c-folic acid (NEPHRO-VITE) 0.8 MG TABS Take 0.8 mg by mouth daily.       No current facility-administered medications for this visit.    No Known Allergies  History   Social History  . Marital Status: Married    Spouse Name: N/A    Number of Children: N/A  . Years of Education: N/A   Occupational History  . Not on file.   Social History Main Topics  . Smoking status: Former Smoker    Quit date: 06/08/1995  . Smokeless tobacco: Not on file  . Alcohol Use: No  . Drug Use: No  . Sexually Active: Not on file  Other Topics Concern  . Not on file   Social History Narrative  . No narrative on file     Review of Systems: General: negative for chills, fever, night sweats or weight changes.  Cardiovascular: negative for chest pain, dyspnea on exertion, edema, orthopnea, palpitations, paroxysmal nocturnal dyspnea or shortness of breath Dermatological: negative for rash Respiratory: negative for cough or wheezing Urologic: negative for hematuria Abdominal: negative for nausea, vomiting, diarrhea, bright red blood per rectum, melena, or hematemesis Neurologic: negative for visual changes, syncope, or dizziness All other systems reviewed and are otherwise negative except as noted above.    Blood pressure 132/84, pulse 64, height 5\' 11"  (1.803 m), weight 282 lb (127.914 kg).  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 His LAD and circumflex were free of significant disease. He's had no recurrent symptoms. His last Myoview stress test performed 12/26/08 was nonischemic.      Runell Gess MD FACP,FACC,FAHA, [facility] 04/14/2013 10:53 AM

## 2013-04-14 NOTE — Patient Instructions (Addendum)
Your physician wants you to follow-up in: 1 year with Dr Berry. You will receive a reminder letter in the mail two months in advance. If you don't receive a letter, please call our office to schedule the follow-up appointment.  

## 2013-04-14 NOTE — Assessment & Plan Note (Signed)
Patient underwent PCI and stenting of a nondominant RCA by myself 11/22/97 with a bare-metal stent. His LAD and circumflex were free of significant disease. He's had no recurrent symptoms. His last Myoview stress test performed 12/26/08 was nonischemic.

## 2013-04-22 ENCOUNTER — Encounter: Payer: Self-pay | Admitting: Surgery

## 2013-04-25 ENCOUNTER — Encounter: Payer: Self-pay | Admitting: Surgery

## 2013-04-25 ENCOUNTER — Ambulatory Visit (INDEPENDENT_AMBULATORY_CARE_PROVIDER_SITE_OTHER): Payer: Medicare Other | Admitting: Surgery

## 2013-04-25 VITALS — BP 135/49 | HR 58 | Resp 18 | Ht 71.0 in | Wt 277.4 lb

## 2013-04-25 DIAGNOSIS — N186 End stage renal disease: Secondary | ICD-10-CM

## 2013-04-25 NOTE — Progress Notes (Signed)
Vascular and Vein Specialist of [facility]   Patient name: Antonio Page MRN: [id number] DOB: May 15, 1941 Sex: male     Chief Complaint  Patient presents with  . Follow-up    evaluate for (fistula) aneursym repair or revision     HISTORY OF PRESENT ILLNESS: The patient comes in today for evaluation of his right upper arm AV fistula which was created in March of 2012 and was banded in April of 2012. He reports an area of increased size around the distal buttonhole site. He is not having fevers or erythema or pain around the area.  Past Medical History  Diagnosis Date  . Hypertension   . Sleep apnea     uses a cpap  . Diabetes mellitus   . GERD (gastroesophageal reflux disease)   . Arthritis   . Glaucoma   . Dialysis patient   . Blind right eye   . Chronic kidney disease     dialysis  . Kidney stones   . Coronary artery disease     Past Surgical History  Procedure Laterality Date  . Dg av dialysis  shunt access exist*l* or  2010    left upper arm-not using  . Dg av dialysis  shunt access exist*l* or  2012    right upper arm-using this one  . Shoulder arthroscopy      right  . Knee arthroscopy      left  . Tonsillectomy    . Eye surgery      multiple rt eye surgeries  . Coronary stent placement    . Trigger finger release  06/08/2012    Procedure: RELEASE TRIGGER FINGER/A-1 PULLEY;  Surgeon: Wyn Forster., MD;  Location: [facility];  Service: Orthopedics;  Laterality: Right;  . Dupuytren contracture release  06/08/2012    Procedure: DUPUYTREN CONTRACTURE RELEASE;  Surgeon: Wyn Forster., MD;  Location: [facility];  Service: Orthopedics;  Laterality: Right;  right ring fascia excision with A1 Pulley right ring  . Cystoscopy with retrograde pyelogram, ureteroscopy and stent placement  07/21/2012    Procedure: CYSTOSCOPY WITH RETROGRADE PYELOGRAM, URETEROSCOPY AND STENT PLACEMENT;  Surgeon: Anner Crete, MD;  Location: [facility];   Service: Urology;  Laterality: Right;  . Mass excision  07/26/2012    Procedure: MINOR EXCISION OF MASS;  Surgeon: Serena Colonel, MD;  Location:  SURGERY CENTER;  Service: ENT;  Laterality: Right;  Excision of a Right Ear Skin Cancer with Primary Closure  wound class per Dr. Pollyann Kennedy   . Tee without cardioversion N/A 03/31/2013    Procedure: TRANSESOPHAGEAL ECHOCARDIOGRAM (TEE);  Surgeon: Thurmon Fair, MD;  Location: [facility];  Service: Cardiovascular;  Laterality: N/A;    History   Social History  . Marital Status: Married    Spouse Name: N/A    Number of Children: N/A  . Years of Education: N/A   Occupational History  . Not on file.   Social History Main Topics  . Smoking status: Former Smoker    Quit date: 06/08/1995  . Smokeless tobacco: Not on file  . Alcohol Use: No  . Drug Use: No  . Sexual Activity: Not on file   Other Topics Concern  . Not on file   Social History Narrative  . No narrative on file    Family History  Problem Relation Age of Onset  . Heart disease Father     Allergies as of 04/25/2013  . (No Known Allergies)  Current Outpatient Prescriptions on File Prior to Visit  Medication Sig Dispense Refill  . allopurinol (ZYLOPRIM) 300 MG tablet Take 150 mg by mouth daily.       Marland Kitchen amLODipine (NORVASC) 10 MG tablet Take 10 mg by mouth at bedtime.      Marland Kitchen aspirin EC 81 MG tablet Take 81 mg by mouth daily.      Marland Kitchen b complex-vitamin c-folic acid (NEPHRO-VITE) 0.8 MG TABS Take 0.8 mg by mouth daily.      . calcium carbonate (TUMS - DOSED IN MG ELEMENTAL CALCIUM) 500 MG chewable tablet Chew 2 tablets by mouth 3 (three) times daily with meals.      . CeFAZolin Sodium (ANCEF IV) Inject 2 g into the vein. Take three days a week      . fexofenadine (ALLEGRA) 180 MG tablet Take 180 mg by mouth daily.      . hydrALAZINE (APRESOLINE) 50 MG tablet Take 50 mg by mouth daily as needed (for high blood pressure).      . insulin NPH-insulin regular (NOVOLIN  70/30) (70-30) 100 UNIT/ML injection Inject 40 Units into the skin daily with breakfast.       . losartan (COZAAR) 100 MG tablet Take 100 mg by mouth at bedtime.      Marland Kitchen omeprazole (PRILOSEC) 20 MG capsule Take 20 mg by mouth daily.      . polyethylene glycol (MIRALAX / GLYCOLAX) packet Take 17 g by mouth daily. Does not take on days of dialysis.      Marland Kitchen sodium chloride (OCEAN) 0.65 % SOLN nasal spray Place 1 spray into the nose daily as needed for congestion.      Marland Kitchen guaifenesin (ROBITUSSIN CHEST CONGESTION) 100 MG/5ML syrup Take 200 mg by mouth 3 (three) times daily as needed for cough.       No current facility-administered medications on file prior to visit.     REVIEW OF SYSTEMS: Cardiovascular: No chest pain, chest pressure, palpitations, orthopnea, or dyspnea on exertion. No claudication or rest pain,  No history of DVT or phlebitis. Pulmonary: No productive cough, asthma or wheezing. Neurologic: No weakness, paresthesias, aphasia, or amaurosis. No dizziness. Hematologic: No bleeding problems or clotting disorders. Musculoskeletal: No joint pain or joint swelling. Gastrointestinal: No blood in stool or hematemesis Genitourinary: No dysuria or hematuria. Psychiatric:: No history of major depression. Integumentary: No rashes or ulcers. Constitutional: No fever or chills.  PHYSICAL EXAMINATION:   Vital signs are BP 135/49  Pulse 58  Resp 18  Ht 5\' 11"  (1.803 m)  Wt 277 lb 6.4 oz (125.828 kg)  BMI 38.71 kg/m2 General: The patient appears their stated age. HEENT:  No gross abnormalities Pulmonary:  Non labored breathing Musculoskeletal: There are no major deformities. Neurologic: No focal weakness or paresthesias are detected, Skin: There are no ulcer or rashes noted. Psychiatric: The patient has normal affect. Cardiovascular: Patent right upper arm fistula. There is an area of aneurysmal change/pseudoaneurysm near the antecubital crease. There is no evidence of infection  here     Assessment: Aneurysm 2 right upper arm fistula Plan: There is no indication for surgical revision of this area. My recommendation would be to start cannulation had another access site. I think that surgical revision of the area of concern were potentially put his fistula at risk. There is no evidence of infection. As long as the area does not increase at a rapid rate, I would not repair this area.  Jorge Ny, M.D. [facility] [place] Office: [phone] Pager:  [phone]

## 2013-07-14 ENCOUNTER — Other Ambulatory Visit: Payer: Self-pay

## 2013-08-15 ENCOUNTER — Ambulatory Visit
Admission: RE | Admit: 2013-08-15 | Discharge: 2013-08-15 | Disposition: A | Discharge status: Home / Self Care | Payer: Medicare Other | Source: Ambulatory Visit | Attending: Nephrology | Admitting: Nephrology

## 2013-08-15 ENCOUNTER — Other Ambulatory Visit: Payer: Self-pay | Admitting: Nephrology

## 2013-08-15 DIAGNOSIS — R079 Chest pain, unspecified: Secondary | ICD-10-CM

## 2013-08-15 DIAGNOSIS — R0602 Shortness of breath: Secondary | ICD-10-CM

## 2013-10-19 ENCOUNTER — Encounter (HOSPITAL_COMMUNITY)
Admission: EM | Disposition: A | Discharge status: Home / Self Care | Payer: Self-pay | Source: Home / Self Care | Attending: Emergency Medicine

## 2013-10-19 ENCOUNTER — Emergency Department (HOSPITAL_COMMUNITY): Payer: Medicare Other | Admitting: Anesthesiology

## 2013-10-19 ENCOUNTER — Encounter (HOSPITAL_COMMUNITY): Payer: Self-pay | Admitting: Emergency Medicine

## 2013-10-19 ENCOUNTER — Ambulatory Visit (HOSPITAL_COMMUNITY)
Admission: EM | Admit: 2013-10-19 | Discharge: 2013-10-19 | Disposition: A | Discharge status: Home / Self Care | Payer: Medicare Other | Attending: Emergency Medicine | Admitting: Emergency Medicine

## 2013-10-19 ENCOUNTER — Encounter (HOSPITAL_COMMUNITY): Payer: Medicare Other | Admitting: Anesthesiology

## 2013-10-19 DIAGNOSIS — G4730 Sleep apnea, unspecified: Secondary | ICD-10-CM | POA: Insufficient documentation

## 2013-10-19 DIAGNOSIS — K219 Gastro-esophageal reflux disease without esophagitis: Secondary | ICD-10-CM | POA: Insufficient documentation

## 2013-10-19 DIAGNOSIS — F17200 Nicotine dependence, unspecified, uncomplicated: Secondary | ICD-10-CM | POA: Insufficient documentation

## 2013-10-19 DIAGNOSIS — E119 Type 2 diabetes mellitus without complications: Secondary | ICD-10-CM | POA: Insufficient documentation

## 2013-10-19 DIAGNOSIS — I120 Hypertensive chronic kidney disease with stage 5 chronic kidney disease or end stage renal disease: Secondary | ICD-10-CM | POA: Insufficient documentation

## 2013-10-19 DIAGNOSIS — Z951 Presence of aortocoronary bypass graft: Secondary | ICD-10-CM | POA: Insufficient documentation

## 2013-10-19 DIAGNOSIS — J4489 Other specified chronic obstructive pulmonary disease: Secondary | ICD-10-CM | POA: Insufficient documentation

## 2013-10-19 DIAGNOSIS — T82898A Other specified complication of vascular prosthetic devices, implants and grafts, initial encounter: Secondary | ICD-10-CM | POA: Insufficient documentation

## 2013-10-19 DIAGNOSIS — N186 End stage renal disease: Secondary | ICD-10-CM

## 2013-10-19 DIAGNOSIS — T82838A Hemorrhage of vascular prosthetic devices, implants and grafts, initial encounter: Secondary | ICD-10-CM

## 2013-10-19 DIAGNOSIS — I2510 Atherosclerotic heart disease of native coronary artery without angina pectoris: Secondary | ICD-10-CM | POA: Insufficient documentation

## 2013-10-19 DIAGNOSIS — Z992 Dependence on renal dialysis: Secondary | ICD-10-CM | POA: Insufficient documentation

## 2013-10-19 DIAGNOSIS — J449 Chronic obstructive pulmonary disease, unspecified: Secondary | ICD-10-CM | POA: Insufficient documentation

## 2013-10-19 DIAGNOSIS — Y849 Medical procedure, unspecified as the cause of abnormal reaction of the patient, or of later complication, without mention of misadventure at the time of the procedure: Secondary | ICD-10-CM | POA: Insufficient documentation

## 2013-10-19 HISTORY — PX: REVISION OF ARTERIOVENOUS GORETEX GRAFT: SHX6073

## 2013-10-19 LAB — BASIC METABOLIC PANEL
BUN: 47 mg/dL — AB (ref 6–23)
CHLORIDE: 101 meq/L (ref 96–112)
CO2: 22 mEq/L (ref 19–32)
CREATININE: 8.98 mg/dL — AB (ref 0.50–1.35)
Calcium: 9.1 mg/dL (ref 8.4–10.5)
GFR calc Af Amer: 6 mL/min — ABNORMAL LOW (ref >90)
GFR, EST NON AFRICAN AMERICAN: 5 mL/min — AB (ref >90)
Glucose, Bld: 260 mg/dL — ABNORMAL HIGH (ref 70–99)
Potassium: 4.6 mEq/L (ref 3.7–5.3)
Sodium: 144 mEq/L (ref 137–147)

## 2013-10-19 LAB — GLUCOSE, CAPILLARY
GLUCOSE-CAPILLARY: 290 mg/dL — AB (ref 70–99)
GLUCOSE-CAPILLARY: 260 mg/dL — AB (ref 70–99)

## 2013-10-19 LAB — CBC WITH DIFFERENTIAL/PLATELET
Basophils Absolute: 0 10*3/uL (ref 0.0–0.1)
Basophils Relative: 0 % (ref 0–1)
EOS ABS: 0.2 10*3/uL (ref 0.0–0.7)
Eosinophils Relative: 3 % (ref 0–5)
HEMATOCRIT: 26.8 % — AB (ref 39.0–52.0)
HEMOGLOBIN: 9 g/dL — AB (ref 13.0–17.0)
Lymphocytes Relative: 22 % (ref 12–46)
Lymphs Abs: 1.6 10*3/uL (ref 0.7–4.0)
MCH: 32 pg (ref 26.0–34.0)
MCHC: 33.6 g/dL (ref 30.0–36.0)
MCV: 95.4 fL (ref 78.0–100.0)
MONO ABS: 0.4 10*3/uL (ref 0.1–1.0)
MONOS PCT: 6 % (ref 3–12)
Neutro Abs: 4.9 10*3/uL (ref 1.7–7.7)
Neutrophils Relative %: 69 % (ref 43–77)
Platelets: 155 10*3/uL (ref 150–400)
RBC: 2.81 MIL/uL — ABNORMAL LOW (ref 4.22–5.81)
RDW: 15.2 % (ref 11.5–15.5)
WBC: 7.1 10*3/uL (ref 4.0–10.5)

## 2013-10-19 LAB — TYPE AND SCREEN
ABO/RH(D): O POS
Antibody Screen: NEGATIVE

## 2013-10-19 LAB — PROTIME-INR
INR: 1 (ref 0.00–1.49)
PROTHROMBIN TIME: 13 s (ref 11.6–15.2)

## 2013-10-19 LAB — APTT: APTT: 32 s (ref 24–37)

## 2013-10-19 SURGERY — REVISION OF ARTERIOVENOUS GORETEX GRAFT
Anesthesia: General | Site: Arm Upper | Laterality: Right

## 2013-10-19 MED ORDER — LIDOCAINE HCL (CARDIAC) 20 MG/ML IV SOLN
INTRAVENOUS | Status: AC
  Filled 2013-10-19: qty 5

## 2013-10-19 MED ORDER — PROPOFOL 10 MG/ML IV BOLUS
INTRAVENOUS | Status: DC | PRN
  Administered 2013-10-19: 110 mg via INTRAVENOUS

## 2013-10-19 MED ORDER — PROPOFOL 10 MG/ML IV BOLUS
INTRAVENOUS | Status: AC
  Filled 2013-10-19: qty 20

## 2013-10-19 MED ORDER — ONDANSETRON HCL 4 MG/2ML IJ SOLN
INTRAMUSCULAR | Status: AC
  Filled 2013-10-19: qty 2

## 2013-10-19 MED ORDER — ONDANSETRON HCL 4 MG/2ML IJ SOLN
4.0000 mg | Freq: Once | INTRAMUSCULAR | Status: DC
  Filled 2013-10-19: qty 2

## 2013-10-19 MED ORDER — 0.9 % SODIUM CHLORIDE (POUR BTL) OPTIME
TOPICAL | Status: DC | PRN
  Administered 2013-10-19: 1000 mL

## 2013-10-19 MED ORDER — SUCCINYLCHOLINE CHLORIDE 20 MG/ML IJ SOLN
INTRAMUSCULAR | Status: AC
  Filled 2013-10-19: qty 1

## 2013-10-19 MED ORDER — LIDOCAINE HCL (CARDIAC) 20 MG/ML IV SOLN
INTRAVENOUS | Status: DC | PRN
  Administered 2013-10-19: 100 mg via INTRAVENOUS

## 2013-10-19 MED ORDER — MIDAZOLAM HCL 2 MG/2ML IJ SOLN
INTRAMUSCULAR | Status: AC
  Filled 2013-10-19: qty 2

## 2013-10-19 MED ORDER — FENTANYL CITRATE 0.05 MG/ML IJ SOLN
INTRAMUSCULAR | Status: DC | PRN
  Administered 2013-10-19: 50 ug via INTRAVENOUS
  Administered 2013-10-19: 100 ug via INTRAVENOUS

## 2013-10-19 MED ORDER — OXYCODONE-ACETAMINOPHEN 5-325 MG PO TABS: 1.0000 | ORAL_TABLET | Freq: Four times a day (QID) | ORAL | Status: DC | PRN

## 2013-10-19 MED ORDER — CEFAZOLIN SODIUM-DEXTROSE 2-3 GM-% IV SOLR
INTRAVENOUS | Status: DC | PRN
  Administered 2013-10-19: 2 g via INTRAVENOUS

## 2013-10-19 MED ORDER — SODIUM CHLORIDE 0.9 % IV SOLN
INTRAVENOUS | Status: DC
  Administered 2013-10-19: 11:00:00 via INTRAVENOUS

## 2013-10-19 MED ORDER — FENTANYL CITRATE 0.05 MG/ML IJ SOLN
INTRAMUSCULAR | Status: AC
  Filled 2013-10-19: qty 5

## 2013-10-19 MED ORDER — MORPHINE SULFATE 4 MG/ML IJ SOLN
4.0000 mg | Freq: Once | INTRAMUSCULAR | Status: DC
  Filled 2013-10-19: qty 1

## 2013-10-19 MED ORDER — SUCCINYLCHOLINE CHLORIDE 20 MG/ML IJ SOLN
INTRAMUSCULAR | Status: DC | PRN
  Administered 2013-10-19: 150 mg via INTRAVENOUS

## 2013-10-19 MED ORDER — CEFAZOLIN SODIUM-DEXTROSE 2-3 GM-% IV SOLR
INTRAVENOUS | Status: AC
  Filled 2013-10-19: qty 50

## 2013-10-19 MED ORDER — ONDANSETRON HCL 4 MG/2ML IJ SOLN
INTRAMUSCULAR | Status: DC | PRN
  Administered 2013-10-19: 4 mg via INTRAVENOUS

## 2013-10-19 MED ORDER — SODIUM CHLORIDE 0.9 % IR SOLN
Status: DC | PRN
  Administered 2013-10-19: 12:00:00

## 2013-10-19 SURGICAL SUPPLY — 25 items
BANDAGE GAUZE ELAST BULKY 4 IN (GAUZE/BANDAGES/DRESSINGS) ×3 IMPLANT
BENZOIN TINCTURE PRP APPL 2/3 (GAUZE/BANDAGES/DRESSINGS) ×3 IMPLANT
CANISTER SUCTION 2500CC (MISCELLANEOUS) ×3 IMPLANT
CLIP LIGATING EXTRA MED SLVR (CLIP) ×3 IMPLANT
CLIP LIGATING EXTRA SM BLUE (MISCELLANEOUS) ×3 IMPLANT
COVER SURGICAL LIGHT HANDLE (MISCELLANEOUS) ×3 IMPLANT
ELECT REM PT RETURN 9FT ADLT (ELECTROSURGICAL) ×3
ELECTRODE REM PT RTRN 9FT ADLT (ELECTROSURGICAL) ×1 IMPLANT
GLOVE BIO SURGEON STRL SZ7 (GLOVE) ×3 IMPLANT
GLOVE SS BIOGEL STRL SZ 7.5 (GLOVE) ×1 IMPLANT
GLOVE SUPERSENSE BIOGEL SZ 7.5 (GLOVE) ×2
GOWN STRL REUS W/ TWL LRG LVL3 (GOWN DISPOSABLE) ×3 IMPLANT
GOWN STRL REUS W/TWL LRG LVL3 (GOWN DISPOSABLE) ×6
KIT BASIN OR (CUSTOM PROCEDURE TRAY) ×3 IMPLANT
KIT ROOM TURNOVER OR (KITS) ×3 IMPLANT
NEEDLE HYPO 25GX1X1/2 BEV (NEEDLE) ×3 IMPLANT
NS IRRIG 1000ML POUR BTL (IV SOLUTION) ×3 IMPLANT
PACK CV ACCESS (CUSTOM PROCEDURE TRAY) ×3 IMPLANT
PAD ARMBOARD 7.5X6 YLW CONV (MISCELLANEOUS) ×3 IMPLANT
SPONGE GAUZE 4X4 12PLY (GAUZE/BANDAGES/DRESSINGS) ×3 IMPLANT
SUT PROLENE 6 0 CC (SUTURE) ×6 IMPLANT
SUT VIC AB 3-0 SH 27 (SUTURE) ×4
SUT VIC AB 3-0 SH 27X BRD (SUTURE) ×2 IMPLANT
TOWEL OR 17X26 10 PK STRL BLUE (TOWEL DISPOSABLE) ×3 IMPLANT
UNDERPAD 30X30 INCONTINENT (UNDERPADS AND DIAPERS) ×3 IMPLANT

## 2013-10-19 NOTE — ED Notes (Signed)
Per EMS- pt coughed this morning and began bleeding from his rt fistula. Pt called EMs and the applied a tournique at 0900. Bleeding was controlled until arrival to ED. Pressure was applied. Pt last dialysis was Monday.

## 2013-10-19 NOTE — Anesthesia Postprocedure Evaluation (Signed)
Anesthesia Post Note  Patient: Antonio Page  Procedure(s) Performed: Procedure(s) (LRB): REVISION OF ARTERIOVENOUS GORETEX GRAFT - thrombectomy (Right)  Anesthesia type: general  Patient location: PACU  Post pain: Pain level controlled  Post assessment: Patient's Cardiovascular Status Stable  Last Vitals:  Filed Vitals:   10/19/13 1227  BP: 155/35  Pulse: 88  Temp:   Resp:     Post vital signs: Reviewed and stable  Level of consciousness: sedated  Complications: No apparent anesthesia complications

## 2013-10-19 NOTE — Progress Notes (Signed)
notifed dr Conrad [place] pf bs=[phone] here at bedside spoke with pt pt will rx at home

## 2013-10-19 NOTE — H&P (Signed)
Patient Information    Patient Name Sex DOB SSN    Antonio Page, Antonio Page Male 1940-12-03 [id number]       ED Provider Notes by Orpah Greek, MD at 10/19/2013  9:33 AM    Author: Orpah Greek, MD Service: (none) Author Type: Physician    Filed: 10/19/2013  9:47 AM Note Time: 10/19/2013  9:33 AM Status: Signed    Editor: Orpah Greek, MD (Physician)        CSN: [id number]     [place] date & time 10/19/13  0930 History     First MD Initiated Contact with Patient 10/19/13 0932      No chief complaint on file.       (Consider location/radiation/quality/duration/timing/severity/associated sxs/prior Treatment) HPI Comments: Patient brought to ER for evaluation of bleeding from RUE AV dialysis shunt. Patient was due for dialysis today, has not gone yet. He reports that he coughed and the bleeding suddenly started. No prior trauma.   EMS report that the patient lost 750-1045ml of blood at scene. They have placed a tourniquet and are applying direct pressure, but he is still bleeding. Patient complaining of severe pain due to tourniquet.      Past Medical History   Diagnosis  Date   .  Hypertension     .  Sleep apnea         uses a cpap   .  Diabetes mellitus     .  GERD (gastroesophageal reflux disease)     .  Arthritis     .  Glaucoma     .  Dialysis patient     .  Blind right eye     .  Chronic kidney disease         dialysis   .  Kidney stones     .  Coronary artery disease         Past Surgical History   Procedure  Laterality  Date   .  Dg av dialysis  shunt access exist*l* or    2010       left upper arm-not using   .  Dg av dialysis  shunt access exist*l* or    2012       right upper arm-using this one   .  Shoulder arthroscopy           right   .  Knee arthroscopy           left   .  Tonsillectomy       .  Eye surgery           multiple rt eye surgeries   .  Coronary stent placement       .  Trigger finger release    06/08/2012        Procedure: RELEASE TRIGGER FINGER/A-1 PULLEY;  Surgeon: Cammie Sickle., MD;  Location: [facility];  Service: Orthopedics;  Laterality: Right;   .  Dupuytren contracture release    06/08/2012       Procedure: DUPUYTREN CONTRACTURE RELEASE;  Surgeon: Cammie Sickle., MD;  Location: [place];  Service: Orthopedics;  Laterality: Right;  right ring fascia excision with A1 Pulley right ring   .  Cystoscopy with retrograde pyelogram, ureteroscopy and stent placement    07/21/2012       Procedure: [place], URETEROSCOPY AND STENT PLACEMENT;  Surgeon: Malka So, MD;  Location: [facility];  Service: Urology;  Laterality: Right;   .  Mass excision    07/26/2012       Procedure: MINOR EXCISION OF MASS;  Surgeon: Izora Gala, MD;  Location: [place] [facility];  Service: ENT;  Laterality: Right;  Excision of a Right Ear Skin Cancer with Primary Closure  wound class per Dr. Constance Holster    .  Tee without cardioversion  N/A  03/31/2013       Procedure: TRANSESOPHAGEAL ECHOCARDIOGRAM (TEE);  Surgeon: Sanda Klein, MD;  Location: [facility];  Service: Cardiovascular;  Laterality: N/A;       Family History   Problem  Relation  Age of Onset   .  Heart disease  Father         History   Substance Use Topics   .  Smoking status:  Former Smoker       Quit date:  06/08/1995   .  Smokeless tobacco:  Not on file   .  Alcohol Use:  No        Review of Systems  Respiratory: Negative.   Cardiovascular: Negative.   Skin: Positive for wound.  All other systems reviewed and are negative.            Allergies    Review of patient's allergies indicates no known allergies.    Home Medications       Current Outpatient Rx   Name    Route    Sig    Dispense    Refill   .  allopurinol (ZYLOPRIM) 300 MG tablet     Oral     Take 150 mg by mouth daily.                  Marland Kitchen  amLODipine (NORVASC) 10 MG tablet     Oral     Take  10 mg by mouth at bedtime.                Marland Kitchen  aspirin EC 81 MG tablet     Oral     Take 81 mg by mouth daily.                Marland Kitchen  b complex-vitamin c-folic acid (NEPHRO-VITE) 0.8 MG TABS     Oral     Take 0.8 mg by mouth daily.                .  calcium carbonate (TUMS - DOSED IN MG ELEMENTAL CALCIUM) 500 MG chewable tablet     Oral     Chew 2 tablets by mouth 3 (three) times daily with meals.                .  CeFAZolin Sodium (ANCEF IV)     Intravenous     Inject 2 g into the vein. Take three days a week                .  fexofenadine (ALLEGRA) 180 MG tablet     Oral     Take 180 mg by mouth daily.                Marland Kitchen  guaifenesin (ROBITUSSIN CHEST CONGESTION) 100 MG/5ML syrup     Oral     Take 200 mg by mouth 3 (three) times daily as needed for cough.                .  hydrALAZINE (APRESOLINE) 50 MG tablet  Oral     Take 50 mg by mouth daily as needed (for high blood pressure).                .  insulin NPH-insulin regular (NOVOLIN 70/30) (70-30) 100 UNIT/ML injection     Subcutaneous     Inject 40 Units into the skin daily with breakfast.                  .  losartan (COZAAR) 100 MG tablet     Oral     Take 100 mg by mouth at bedtime.                Marland Kitchen  omeprazole (PRILOSEC) 20 MG capsule     Oral     Take 20 mg by mouth daily.                .  polyethylene glycol (MIRALAX / GLYCOLAX) packet     Oral     Take 17 g by mouth daily. Does not take on days of dialysis.                Marland Kitchen  sodium chloride (OCEAN) 0.65 % SOLN nasal spray     Nasal     Place 1 spray into the nose daily as needed for congestion.                   There were no vitals taken for this visit. Physical Exam  Constitutional: He is oriented to person, place, and time. He appears well-developed and well-nourished. No distress.  HENT:   Head: Normocephalic and atraumatic.  Right Ear: Hearing normal.  Left Ear: Hearing normal.   Nose: Nose  normal.   Mouth/Throat: Oropharynx is clear and moist and mucous membranes are normal.  Eyes: Conjunctivae and EOM are normal. Pupils are equal, round, and reactive to light.  Neck: Normal range of motion. Neck supple.  Cardiovascular: Regular rhythm, S1 normal and S2 normal.  Exam reveals no gallop and no friction rub.    No murmur heard. Pulmonary/Chest: Effort normal and breath sounds normal. No respiratory distress. He exhibits no tenderness.  Abdominal: Soft. Normal appearance and bowel sounds are normal. There is no hepatosplenomegaly. There is no tenderness. There is no rebound, no guarding, no tenderness at McBurney's point and negative Murphy's sign. No hernia.  Musculoskeletal: Normal range of motion.       Arms: Neurological: He is alert and oriented to person, place, and time. He has normal strength. No cranial nerve deficit or sensory deficit. Coordination normal. GCS eye subscore is 4. GCS verbal subscore is 5. GCS motor subscore is 6.  Skin: Skin is warm, dry and intact. No rash noted. No cyanosis.  Psychiatric: He has a normal mood and affect. His speech is normal and behavior is normal. Thought content normal.       ED Course    Procedures (including critical care time) Labs Review Labs Reviewed   CBC WITH DIFFERENTIAL   BASIC METABOLIC PANEL   PROTIME-INR   APTT   TYPE AND SCREEN      Imaging Review No results found.    EKG Interpretation     None             MDM       Final diagnoses:   Bleeding from Dialysis Shunt        Patient presented with significant bleeding from AV shunt. Vascular Surgery, Dr. Oneida Alar consulted and  immediately evaluated patient in ER. Patient to be brought to OR for revision.       Orpah Greek, MD 10/19/13 [phone]     Addendum:  The patient has been re-examined and re-evaluated.  The patient's history and physical has been reviewed and is unchanged.    Antonio Page is a 73 y.o. male is being admitted  with bleeding graft Bleeding right arm graft. All the risks, benefits and other treatment options have been discussed with the patient. The patient has consented to proceed with Procedure(s): REVISION OF ARTERIOVENOUS GORETEX GRAFT as a surgical intervention.  EARLY, TODD 10/19/2013 10:32 AM Vascular and Vein Surgery

## 2013-10-19 NOTE — Progress Notes (Signed)
dilaysis access record faxed to [facility] dialysis center on [address]

## 2013-10-19 NOTE — OR Nursing (Signed)
Emergency Case.

## 2013-10-19 NOTE — ED Notes (Signed)
Dr. Oneida Alar at bedside. Tourniquet removed. Pressure applied by dr. Oneida Alar.

## 2013-10-19 NOTE — ED Provider Notes (Signed)
CSN: [id number]     Arrival date & time 10/19/13  0930 History   First MD Initiated Contact with Patient 10/19/13 0932     No chief complaint on file.    (Consider location/radiation/quality/duration/timing/severity/associated sxs/prior Treatment) HPI Comments: Patient brought to ER for evaluation of bleeding from RUE AV dialysis shunt. Patient was due for dialysis today, has not gone yet. He reports that he coughed and the bleeding suddenly started. No prior trauma.  EMS report that the patient lost 750-1037ml of blood at scene. They have placed a tourniquet and are applying direct pressure, but he is still bleeding. Patient complaining of severe pain due to tourniquet.   Past Medical History  Diagnosis Date  . Hypertension   . Sleep apnea     uses a cpap  . Diabetes mellitus   . GERD (gastroesophageal reflux disease)   . Arthritis   . Glaucoma   . Dialysis patient   . Blind right eye   . Chronic kidney disease     dialysis  . Kidney stones   . Coronary artery disease    Past Surgical History  Procedure Laterality Date  . Dg av dialysis  shunt access exist*l* or  2010    left upper arm-not using  . Dg av dialysis  shunt access exist*l* or  2012    right upper arm-using this one  . Shoulder arthroscopy      right  . Knee arthroscopy      left  . Tonsillectomy    . Eye surgery      multiple rt eye surgeries  . Coronary stent placement    . Trigger finger release  06/08/2012    Procedure: RELEASE TRIGGER FINGER/A-1 PULLEY;  Surgeon: Cammie Sickle., MD;  Location: [facility];  Service: Orthopedics;  Laterality: Right;  . Dupuytren contracture release  06/08/2012    Procedure: DUPUYTREN CONTRACTURE RELEASE;  Surgeon: Cammie Sickle., MD;  Location: [place];  Service: Orthopedics;  Laterality: Right;  right ring fascia excision with A1 Pulley right ring  . Cystoscopy with retrograde pyelogram, ureteroscopy and stent placement   07/21/2012    Procedure: [place], URETEROSCOPY AND STENT PLACEMENT;  Surgeon: Malka So, MD;  Location: [facility];  Service: Urology;  Laterality: Right;  . Mass excision  07/26/2012    Procedure: MINOR EXCISION OF MASS;  Surgeon: Izora Gala, MD;  Location: [place];  Service: ENT;  Laterality: Right;  Excision of a Right Ear Skin Cancer with Primary Closure  wound class per Dr. Constance Holster   . Tee without cardioversion N/A 03/31/2013    Procedure: TRANSESOPHAGEAL ECHOCARDIOGRAM (TEE);  Surgeon: Sanda Klein, MD;  Location: [facility] ENDOSCOPY;  Service: Cardiovascular;  Laterality: N/A;   Family History  Problem Relation Age of Onset  . Heart disease Father    History  Substance Use Topics  . Smoking status: Former Smoker    Quit date: 06/08/1995  . Smokeless tobacco: Not on file  . Alcohol Use: No    Review of Systems  Respiratory: Negative.   Cardiovascular: Negative.   Skin: Positive for wound.  All other systems reviewed and are negative.      Allergies  Review of patient's allergies indicates no known allergies.  Home Medications   Current Outpatient Rx  Name  Route  Sig  Dispense  Refill  . allopurinol (ZYLOPRIM) 300 MG tablet   Oral   Take 150 mg by  mouth daily.          Marland Kitchen amLODipine (NORVASC) 10 MG tablet   Oral   Take 10 mg by mouth at bedtime.         Marland Kitchen aspirin EC 81 MG tablet   Oral   Take 81 mg by mouth daily.         Marland Kitchen b complex-vitamin c-folic acid (NEPHRO-VITE) 0.8 MG TABS   Oral   Take 0.8 mg by mouth daily.         . calcium carbonate (TUMS - DOSED IN MG ELEMENTAL CALCIUM) 500 MG chewable tablet   Oral   Chew 2 tablets by mouth 3 (three) times daily with meals.         . CeFAZolin Sodium (ANCEF IV)   Intravenous   Inject 2 g into the vein. Take three days a week         . fexofenadine (ALLEGRA) 180 MG tablet   Oral   Take 180 mg by mouth daily.         Marland Kitchen guaifenesin (ROBITUSSIN CHEST  CONGESTION) 100 MG/5ML syrup   Oral   Take 200 mg by mouth 3 (three) times daily as needed for cough.         . hydrALAZINE (APRESOLINE) 50 MG tablet   Oral   Take 50 mg by mouth daily as needed (for high blood pressure).         . insulin NPH-insulin regular (NOVOLIN 70/30) (70-30) 100 UNIT/ML injection   Subcutaneous   Inject 40 Units into the skin daily with breakfast.          . losartan (COZAAR) 100 MG tablet   Oral   Take 100 mg by mouth at bedtime.         Marland Kitchen omeprazole (PRILOSEC) 20 MG capsule   Oral   Take 20 mg by mouth daily.         . polyethylene glycol (MIRALAX / GLYCOLAX) packet   Oral   Take 17 g by mouth daily. Does not take on days of dialysis.         Marland Kitchen sodium chloride (OCEAN) 0.65 % SOLN nasal spray   Nasal   Place 1 spray into the nose daily as needed for congestion.          There were no vitals taken for this visit. Physical Exam  Constitutional: He is oriented to person, place, and time. He appears well-developed and well-nourished. No distress.  HENT:  Head: Normocephalic and atraumatic.  Right Ear: Hearing normal.  Left Ear: Hearing normal.  Nose: Nose normal.  Mouth/Throat: Oropharynx is clear and moist and mucous membranes are normal.  Eyes: Conjunctivae and EOM are normal. Pupils are equal, round, and reactive to light.  Neck: Normal range of motion. Neck supple.  Cardiovascular: Regular rhythm, S1 normal and S2 normal.  Exam reveals no gallop and no friction rub.   No murmur heard. Pulmonary/Chest: Effort normal and breath sounds normal. No respiratory distress. He exhibits no tenderness.  Abdominal: Soft. Normal appearance and bowel sounds are normal. There is no hepatosplenomegaly. There is no tenderness. There is no rebound, no guarding, no tenderness at McBurney's point and negative Murphy's sign. No hernia.  Musculoskeletal: Normal range of motion.       Arms: Neurological: He is alert and oriented to person, place, and  time. He has normal strength. No cranial nerve deficit or sensory deficit. Coordination normal. GCS eye subscore is 4. GCS verbal subscore  is 5. GCS motor subscore is 6.  Skin: Skin is warm, dry and intact. No rash noted. No cyanosis.  Psychiatric: He has a normal mood and affect. His speech is normal and behavior is normal. Thought content normal.    ED Course  Procedures (including critical care time) Labs Review Labs Reviewed  CBC WITH DIFFERENTIAL  BASIC METABOLIC PANEL  PROTIME-INR  APTT  TYPE AND SCREEN   Imaging Review No results found.  EKG Interpretation   None       MDM   Final diagnoses:  Bleeding from Dialysis Shunt    Patient presented with significant bleeding from AV shunt. Vascular Surgery, Dr. Oneida Alar consulted and immediately evaluated patient in ER. Patient to be brought to OR for revision.    Orpah Greek, MD 10/19/13 [phone]

## 2013-10-19 NOTE — Anesthesia Procedure Notes (Signed)
Procedure Name: Intubation Date/Time: 10/19/2013 11:10 AM Performed by: Manuela Schwartz B Pre-anesthesia Checklist: Patient identified, Emergency Drugs available, Suction available, Patient being monitored and Timeout performed Patient Re-evaluated:Patient Re-evaluated prior to inductionOxygen Delivery Method: Circle system utilized Preoxygenation: Pre-oxygenation with 100% oxygen Intubation Type: IV induction and Rapid sequence Laryngoscope Size: Mac and 3 Grade View: Grade I Tube type: Parker flex tip Tube size: 7.5 mm Number of attempts: 1 Airway Equipment and Method: Stylet Placement Confirmation: ETT inserted through vocal cords under direct vision,  positive ETCO2 and breath sounds checked- equal and bilateral Secured at: 22 cm Tube secured with: Tape Dental Injury: Teeth and Oropharynx as per pre-operative assessment

## 2013-10-19 NOTE — Anesthesia Preprocedure Evaluation (Addendum)
Anesthesia Evaluation  Patient identified by MRN, date of birth, ID band Patient awake    Reviewed: Allergy & Precautions, H&P , NPO status , Patient's Chart, lab work & pertinent test results  History of Anesthesia Complications Negative for: history of anesthetic complications  Airway Mallampati: II TM Distance: >3 FB Neck ROM: Full    Dental  (+) Teeth Intact   Pulmonary sleep apnea and Continuous Positive Airway Pressure Ventilation , COPDformer smoker,          Cardiovascular hypertension, Pt. on medications + CAD and + Cardiac Stents     Neuro/Psych    GI/Hepatic GERD-  Medicated and Controlled,  Endo/Other  diabetes  Renal/GU ESRF and DialysisRenal diseaseHD m/w/f     Musculoskeletal   Abdominal   Peds  Hematology   Anesthesia Other Findings   Reproductive/Obstetrics                          Anesthesia Physical Anesthesia Plan  ASA: III  Anesthesia Plan: General   Post-op Pain Management:    Induction: Intravenous, Rapid sequence and Cricoid pressure planned  Airway Management Planned: Oral ETT  Additional Equipment:   Intra-op Plan:   Post-operative Plan: Extubation in OR  Informed Consent: I have reviewed the patients History and Physical, chart, labs and discussed the procedure including the risks, benefits and alternatives for the proposed anesthesia with the patient or authorized representative who has indicated his/her understanding and acceptance.   Dental advisory given  Plan Discussed with: Surgeon and CRNA  Anesthesia Plan Comments:        Anesthesia Quick Evaluation

## 2013-10-19 NOTE — Preoperative (Signed)
Beta Blockers   Reason not to administer Beta Blockers:Not Applicable 

## 2013-10-19 NOTE — Op Note (Signed)
    OPERATIVE REPORT  DATE OF SURGERY: 10/19/2013  PATIENT: Antonio Page, 73 y.o. male MRN: [id number]  DOB: November 10, 1940  PRE-OPERATIVE DIAGNOSIS: Bleeding from right upper arm AV fistula  POST-OPERATIVE DIAGNOSIS:  Same  PROCEDURE: Debridement of skin and closure of hole in the left upper arm cephalic vein fistula and closure of the skin over this  SURGEON:  Curt Jews, M.D.  PHYSICIAN ASSISTANT: Collins  ANESTHESIA:  Gen.  EBL: Minimal ml     BLOOD ADMINISTERED: None  DRAINS: None  SPECIMEN: None  COUNTS CORRECT:  YES  PLAN OF CARE: PACU   PATIENT DISPOSITION:  PACU - hemodynamically stable  PROCEDURE DETAILS: Patient presented to the emergency room earlier today with a significant bleed from his right upper arm AV fistula. Patient reported an eschar over the upper arm fistula and coughed and had a large blood loss at home. EMS responded with tourniquet control was taken to the emergency department. Dr. Oneida Alar although patient emergency room and temporarily put a large 0 Prolene figure-of-eight suture of this area to control the acute blood loss. Patient was taken emergently to the operating room for definitive repair. The right arm was prepped and draped in usual sterile fashion. The pressure was held over the cephalic vein at the antecubital space and the sutures are in place and emergency room were removed. The patient actually been using a buttonhole technique and there was bleeding from the base of this buttonhole area. Digital pressure was used above and below this area and an ellipse of skin was removed over the vein. This was carried down to the level of the cephalic vein and the vein edges were debrided. There is no evidence of infection in the vein edges appeared viable this level. The vein was closed with several interrupted 5-0 Prolene sutures. Digital pressure was released and there was excellent hemostasis. The wound was irrigated and the skin was closed with  interrupted 3-0 nylon mattress sutures. Sterile dressing was applied the patient was taken to the recovery room in stable condition   Curt Jews, M.D. 10/19/2013 11:52 AM

## 2013-10-19 NOTE — Transfer of Care (Signed)
Immediate Anesthesia Transfer of Care Note  Patient: Antonio Page  Procedure(s) Performed: Procedure(s): REVISION OF ARTERIOVENOUS GORETEX GRAFT (Right)  Patient Location: PACU  Anesthesia Type:General  Level of Consciousness: awake, alert  and oriented  Airway & Oxygen Therapy: Patient Spontanous Breathing and Patient connected to nasal cannula oxygen  Post-op Assessment: Report given to PACU RN and Post -op Vital signs reviewed and stable  Post vital signs: Reviewed and stable  Complications: No apparent anesthesia complications

## 2013-10-19 NOTE — ED Notes (Signed)
Dr. Oneida Alar sutured area. Pt tolerated well.

## 2013-10-19 NOTE — Progress Notes (Signed)
Called over to the patient's dialysis center to figure out when he would need to come back for treatment. The RN over at the center told me to have the patient come in to the center tomorrow morning at 1030 for his treatment. Patient agrees and understands

## 2013-10-20 ENCOUNTER — Encounter (HOSPITAL_COMMUNITY): Payer: Self-pay | Admitting: Vascular Surgery

## 2013-12-29 ENCOUNTER — Other Ambulatory Visit: Payer: Self-pay | Admitting: *Deleted

## 2013-12-30 ENCOUNTER — Ambulatory Visit (HOSPITAL_COMMUNITY)
Admission: RE | Admit: 2013-12-30 | Discharge: 2013-12-30 | Disposition: A | Discharge status: Home / Self Care | Payer: Medicare Other | Source: Ambulatory Visit | Attending: Vascular Surgery | Admitting: Vascular Surgery

## 2013-12-30 ENCOUNTER — Other Ambulatory Visit (HOSPITAL_COMMUNITY): Payer: Self-pay | Admitting: Internal Medicine

## 2013-12-30 DIAGNOSIS — L0390 Cellulitis, unspecified: Secondary | ICD-10-CM

## 2013-12-30 DIAGNOSIS — L539 Erythematous condition, unspecified: Secondary | ICD-10-CM

## 2013-12-30 DIAGNOSIS — L0291 Cutaneous abscess, unspecified: Secondary | ICD-10-CM | POA: Insufficient documentation

## 2013-12-30 DIAGNOSIS — R609 Edema, unspecified: Secondary | ICD-10-CM

## 2014-01-05 ENCOUNTER — Other Ambulatory Visit: Payer: Self-pay | Admitting: Gastroenterology

## 2014-04-21 ENCOUNTER — Encounter: Payer: Self-pay | Admitting: Pulmonary Disease

## 2014-04-21 ENCOUNTER — Ambulatory Visit (INDEPENDENT_AMBULATORY_CARE_PROVIDER_SITE_OTHER): Payer: Medicare Other | Admitting: Pulmonary Disease

## 2014-04-21 VITALS — BP 120/62 | HR 61 | Temp 98.5°F | Ht 71.0 in | Wt 285.8 lb

## 2014-04-21 DIAGNOSIS — G4733 Obstructive sleep apnea (adult) (pediatric): Secondary | ICD-10-CM

## 2014-04-21 DIAGNOSIS — G4730 Sleep apnea, unspecified: Secondary | ICD-10-CM

## 2014-04-21 NOTE — Patient Instructions (Signed)
Will send an order to your home care company to get you a new cpap machine on the auto setting. Keep up with mask changes and supplies. Work on weight loss followup with me again in one year.

## 2014-04-21 NOTE — Assessment & Plan Note (Signed)
The patient has a history of very severe obstructive sleep apnea, and has done very well on CPAP over the years. He currently has a very aged device, is malfunctioning, and his wife is hearing breakthrough snoring and abnormal breathing patterns. At this point, I would like to get the patient a new CPAP device, and use the auto setting for treatment. The patient will call me if he prefers a fixed pressure. I have asked him to keep up with his mask changes and supplies, and to work aggressively on weight loss. I will see him back in one year.

## 2014-04-21 NOTE — Progress Notes (Signed)
   Subjective:    Patient ID: Antonio Page, male    DOB: 17-May-1941, 73 y.o.   MRN: [id number]  HPI  the patient is a 73 year old male who I've been asked to see for management of obstructive sleep apnea. I initially saw the patient in 2000, where he was found to have severe obstructive sleep apnea with an AHI of 73 of events per hour. He also had oxygen desaturation as low as 64%. He was started on CPAP and found to have an optimal pressure of 12 cm of water, and has been on CPAP since that time. Unfortunately, he never followed up with me. He is currently on his second CPAP machine, and it is starting to malfunction. His wife is also hearing breakthrough snoring and an abnormal pattern during sleep. The patient has kept up with his mask changes, and currently denies significant leaks. He feels that he is not as rested in the mornings currently, but denies significant daytime sleepiness. His wife states he does have sleepiness in the evenings watching television, but closer to his bedtime. The patient currently does not drive. His Epworth score today is only 5.   Review of Systems  Constitutional: Negative for fever and unexpected weight change.  HENT: Negative for congestion, dental problem, ear pain, nosebleeds, postnasal drip, rhinorrhea, sinus pressure, sneezing, sore throat and trouble swallowing.   Eyes: Negative for redness and itching.  Respiratory: Negative for cough, chest tightness, shortness of breath and wheezing.   Cardiovascular: Negative for palpitations and leg swelling.  Gastrointestinal: Negative for nausea and vomiting.  Genitourinary: Negative for dysuria.  Musculoskeletal: Negative for joint swelling.  Skin: Negative for rash.  Neurological: Negative for headaches.  Hematological: Does not bruise/bleed easily.  Psychiatric/Behavioral: Negative for dysphoric mood. The patient is not nervous/anxious.        Objective:   Physical Exam Constitutional:  Obese male, no  acute distress  HENT:  Nares patent without discharge  Oropharynx without exudate, palate and uvula are elongated.   Eyes:  Perrla, eomi, no scleral icterus  Neck:  No JVD, no TMG  Cardiovascular:  Normal rate, regular rhythm, no rubs or gallops.  3/6 sem        Intact distal pulses but diminished  Pulmonary :  Normal breath sounds, no stridor or respiratory distress   No rales, rhonchi, or wheezing  Abdominal:  Soft, nondistended, bowel sounds present.  No tenderness noted.   Musculoskeletal:  1-2+ lower extremity edema noted, +venous stasis changes.   Lymph Nodes:  No cervical lymphadenopathy noted  Skin:  No cyanosis noted  Neurologic:  Alert, appropriate, moves all 4 extremities without obvious deficit.         Assessment & Plan:

## 2014-05-12 ENCOUNTER — Telehealth: Payer: Self-pay | Admitting: Pulmonary Disease

## 2014-05-12 NOTE — Telephone Encounter (Signed)
Download printed for [facility] and placed in his look at. Please advise thanks

## 2014-05-12 NOTE — Telephone Encounter (Signed)
lmomtcb x1 

## 2014-05-12 NOTE — Telephone Encounter (Signed)
Let him know that his download shows that his pressure is running about 15 during the night, and his sleep apnea is TOTALLY controlled on this setting.  Remind him that he is on auto, and the machine will delivery whatever he needs,  If he prefers a fixed pressure, would put him on 15cm. Ok to send an order if he wishes to do this.

## 2014-05-16 NOTE — Telephone Encounter (Signed)
Pt returned call. [phone]

## 2014-05-16 NOTE — Telephone Encounter (Signed)
I spoke with patient about results and he verbalized understanding and had no questions  he wants to stay on auto. Nothing further needed

## 2014-05-24 ENCOUNTER — Telehealth: Payer: Self-pay | Admitting: Pulmonary Disease

## 2014-05-24 NOTE — Telephone Encounter (Signed)
lmtcb x1 

## 2014-05-24 NOTE — Telephone Encounter (Signed)
Please call this pt and make sure he got a new cpap machine.  Let him know his download shows great control of his sleep apnea.  He is set on auto that can adjust thru the night.  He needs to wear everynight if possible.

## 2014-05-25 NOTE — Telephone Encounter (Signed)
Called pt. He did receive new CPAP. He reports he is wearing everynight. Nothing further needed

## 2014-07-13 ENCOUNTER — Ambulatory Visit: Payer: Medicare Other | Admitting: Pulmonary Disease

## 2014-07-25 ENCOUNTER — Ambulatory Visit (INDEPENDENT_AMBULATORY_CARE_PROVIDER_SITE_OTHER): Payer: Medicare Other | Admitting: Pulmonary Disease

## 2014-07-25 ENCOUNTER — Encounter: Payer: Self-pay | Admitting: Pulmonary Disease

## 2014-07-25 VITALS — BP 132/68 | HR 91 | Temp 97.0°F | Ht 71.0 in | Wt 290.4 lb

## 2014-07-25 DIAGNOSIS — G4733 Obstructive sleep apnea (adult) (pediatric): Secondary | ICD-10-CM

## 2014-07-25 NOTE — Patient Instructions (Signed)
Your download today looks great. Continue on cpap, and keep up with mask changes and supplies. Work on weight reduction followup with me again in one year.

## 2014-07-25 NOTE — Progress Notes (Signed)
   Subjective:    Patient ID: Antonio Page, male    DOB: Dec 25, 1940, 73 y.o.   MRN: [id number]  HPI The patient comes in today for follow-up of his obstructive sleep apnea. At the last visit, we got him a new C Pap device, as well as a different mask. He feels that he is doing very well with his current setup, and his download shows great compliance with good control of his AHI. There is no significant mask leak. The patient feels that he is sleeping well, and has adequate daytime alertness.   Review of Systems  Constitutional: Negative for fever and unexpected weight change.  HENT: Negative for congestion, dental problem, ear pain, nosebleeds, postnasal drip, rhinorrhea, sinus pressure, sneezing, sore throat and trouble swallowing.   Eyes: Negative for redness and itching.  Respiratory: Negative for cough, chest tightness, shortness of breath and wheezing.   Cardiovascular: Negative for palpitations and leg swelling.  Gastrointestinal: Negative for nausea and vomiting.  Genitourinary: Negative for dysuria.  Musculoskeletal: Negative for joint swelling.  Skin: Negative for rash.  Neurological: Negative for headaches.  Hematological: Does not bruise/bleed easily.  Psychiatric/Behavioral: Negative for dysphoric mood. The patient is not nervous/anxious.        Objective:   Physical Exam Obese male in no acute distress Nose without purulence or discharge noted No skin breakdown or pressure necrosis from the C Pap mask Neck without lymphadenopathy or thyromegaly Lower extremities with edema noted, no cyanosis Alert and oriented, moves all 4 extremities.       Assessment & Plan:

## 2014-07-25 NOTE — Assessment & Plan Note (Signed)
The patient is doing very well since he has gotten his new machine and also fitted for a better mask. He feels that he is sleeping well, and his download shows great compliance with good control of his AHI. I have asked him to keep up with his mask changes and supplies, and to work aggressively on weight loss.

## 2014-09-11 ENCOUNTER — Encounter: Payer: Self-pay | Admitting: Podiatry

## 2014-09-11 ENCOUNTER — Ambulatory Visit (INDEPENDENT_AMBULATORY_CARE_PROVIDER_SITE_OTHER): Payer: Medicare Other | Admitting: Podiatry

## 2014-09-11 VITALS — BP 167/74 | HR 60 | Resp 16 | Ht 71.0 in | Wt 285.0 lb

## 2014-09-11 DIAGNOSIS — B351 Tinea unguium: Secondary | ICD-10-CM

## 2014-09-11 DIAGNOSIS — E1149 Type 2 diabetes mellitus with other diabetic neurological complication: Secondary | ICD-10-CM

## 2014-09-11 DIAGNOSIS — L84 Corns and callosities: Secondary | ICD-10-CM

## 2014-09-11 DIAGNOSIS — E1140 Type 2 diabetes mellitus with diabetic neuropathy, unspecified: Secondary | ICD-10-CM

## 2014-09-11 NOTE — Progress Notes (Signed)
   Subjective:    Patient ID: Antonio Page, male    DOB: June 18, 1941, 74 y.o.   MRN: [id number]  HPI Comments: Sore on the right plantar foot, second metatarsal right foot. It has been there for several weeks. Maybe 3-4 weeks and just getting worse , it is more painful to walk on it. It was shaved off last by a doctor on  [facility] .  Been a diabetic for 35 years and my last a1c was around 6.0  Dialysis patient , Monday , Wednesday and Friday   Patient describes previous podiatric care proximally year ago. He says he has existing diabetic shoes and a good state of repair, however, he does not wear them  Patient denies history of foot ulceration or claudication  Review of Systems  HENT:       Sinus problems  sneezing   Respiratory: Positive for cough.        Difficulty breathing   Cardiovascular: Positive for leg swelling.  Endocrine:       Diabetes   Skin: Positive for rash.  All other systems reviewed and are negative.      Objective:   Physical Exam  Patient appears orientated 3 and presents with wife  Vascular: Mild pitting edema ankles bilaterally DP pulses 4/4 bilaterally PT pulses 0/4 bilaterally  Neurological: Sensation to 10 g monofilament wire intact 2/5 right and 1/5 left Vibratory sensation nonreactive bilaterally Ankle reflexes reactive bilaterally  Dermatological: Absent hallux nails bilaterally The toenails 2-5 are hypertrophic, elongated, discolored  Hemorrhagic callus plantar second MPJ right  Musculoskeletal: Hammertoe deformity second bilaterally         Assessment & Plan:   Assessment: Decrease pedal pulses suggestive peripheral arterial disease Diabetic peripheral neuropathy Onychomycoses 8 Pre-ulcerative plantar callus right foot  Plan: Debrided toenails 8 without a bleeding Debridement of hemorrhagic callus plantar right that remains closed after debridement  Patient advised to wear his existing diabetic shoes  with custom insoles on ongoing continuous daily basis  Reappoint 3 months and I requested patient wear his diabetic shoes at next visit to evaluate

## 2014-09-11 NOTE — Patient Instructions (Signed)
Where your existing diabetic shoes and existing custom insoles on a continuous and daily basis  Diabetes and Foot Care Diabetes may cause you to have problems because of poor blood supply (circulation) to your feet and legs. This may cause the skin on your feet to become thinner, break easier, and heal more slowly. Your skin may become dry, and the skin may peel and crack. You may also have nerve damage in your legs and feet causing decreased feeling in them. You may not notice minor injuries to your feet that could lead to infections or more serious problems. Taking care of your feet is one of the most important things you can do for yourself.  HOME CARE INSTRUCTIONS  Wear shoes at all times, even in the house. Do not go barefoot. Bare feet are easily injured.  Check your feet daily for blisters, cuts, and redness. If you cannot see the bottom of your feet, use a mirror or ask someone for help.  Wash your feet with warm water (do not use hot water) and mild soap. Then pat your feet and the areas between your toes until they are completely dry. Do not soak your feet as this can dry your skin.  Apply a moisturizing lotion or petroleum jelly (that does not contain alcohol and is unscented) to the skin on your feet and to dry, brittle toenails. Do not apply lotion between your toes.  Trim your toenails straight across. Do not dig under them or around the cuticle. File the edges of your nails with an emery board or nail file.  Do not cut corns or calluses or try to remove them with medicine.  Wear clean socks or stockings every day. Make sure they are not too tight. Do not wear knee-high stockings since they may decrease blood flow to your legs.  Wear shoes that fit properly and have enough cushioning. To break in new shoes, wear them for just a few hours a day. This prevents you from injuring your feet. Always look in your shoes before you put them on to be sure there are no objects inside.  Do  not cross your legs. This may decrease the blood flow to your feet.  If you find a minor scrape, cut, or break in the skin on your feet, keep it and the skin around it clean and dry. These areas may be cleansed with mild soap and water. Do not cleanse the area with peroxide, alcohol, or iodine.  When you remove an adhesive bandage, be sure not to damage the skin around it.  If you have a wound, look at it several times a day to make sure it is healing.  Do not use heating pads or hot water bottles. They may burn your skin. If you have lost feeling in your feet or legs, you may not know it is happening until it is too late.  Make sure your health care provider performs a complete foot exam at least annually or more often if you have foot problems. Report any cuts, sores, or bruises to your health care provider immediately. SEEK MEDICAL CARE IF:   You have an injury that is not healing.  You have cuts or breaks in the skin.  You have an ingrown nail.  You notice redness on your legs or feet.  You feel burning or tingling in your legs or feet.  You have pain or cramps in your legs and feet.  Your legs or feet are numb.  Your feet always feel cold. SEEK IMMEDIATE MEDICAL CARE IF:   There is increasing redness, swelling, or pain in or around a wound.  There is a red line that goes up your leg.  Pus is coming from a wound.  You develop a fever or as directed by your health care provider.  You notice a bad smell coming from an ulcer or wound. Document Released: 08/22/2000 Document Revised: 04/27/2013 Document Reviewed: 02/01/2013 [facility] Patient Information 2015 [place], [place]. This information is not intended to replace advice given to you by your health care provider. Make sure you discuss any questions you have with your health care provider.

## 2014-10-03 ENCOUNTER — Ambulatory Visit
Admission: RE | Admit: 2014-10-03 | Discharge: 2014-10-03 | Disposition: A | Discharge status: Home / Self Care | Payer: Medicare Other | Source: Ambulatory Visit | Attending: Nephrology | Admitting: Nephrology

## 2014-10-03 ENCOUNTER — Other Ambulatory Visit: Payer: Self-pay | Admitting: Nephrology

## 2014-10-03 DIAGNOSIS — R053 Chronic cough: Secondary | ICD-10-CM

## 2014-10-03 DIAGNOSIS — R05 Cough: Secondary | ICD-10-CM

## 2014-10-12 ENCOUNTER — Ambulatory Visit: Payer: Self-pay | Admitting: Podiatry

## 2014-12-11 ENCOUNTER — Encounter: Payer: Self-pay | Admitting: Podiatry

## 2014-12-11 ENCOUNTER — Ambulatory Visit (INDEPENDENT_AMBULATORY_CARE_PROVIDER_SITE_OTHER): Payer: Medicare Other | Admitting: Podiatry

## 2014-12-11 VITALS — BP 119/100 | HR 60 | Resp 12

## 2014-12-11 DIAGNOSIS — L84 Corns and callosities: Secondary | ICD-10-CM

## 2014-12-11 DIAGNOSIS — B351 Tinea unguium: Secondary | ICD-10-CM

## 2014-12-11 DIAGNOSIS — E1149 Type 2 diabetes mellitus with other diabetic neurological complication: Secondary | ICD-10-CM

## 2014-12-11 DIAGNOSIS — E1140 Type 2 diabetes mellitus with diabetic neuropathy, unspecified: Secondary | ICD-10-CM | POA: Diagnosis not present

## 2014-12-11 NOTE — Patient Instructions (Signed)
Diabetes and Foot Care Diabetes may cause you to have problems because of poor blood supply (circulation) to your feet and legs. This may cause the skin on your feet to become thinner, break easier, and heal more slowly. Your skin may become dry, and the skin may peel and crack. You may also have nerve damage in your legs and feet causing decreased feeling in them. You may not notice minor injuries to your feet that could lead to infections or more serious problems. Taking care of your feet is one of the most important things you can do for yourself.  HOME CARE INSTRUCTIONS  Wear shoes at all times, even in the house. Do not go barefoot. Bare feet are easily injured.  Check your feet daily for blisters, cuts, and redness. If you cannot see the bottom of your feet, use a mirror or ask someone for help.  Wash your feet with warm water (do not use hot water) and mild soap. Then pat your feet and the areas between your toes until they are completely dry. Do not soak your feet as this can dry your skin.  Apply a moisturizing lotion or petroleum jelly (that does not contain alcohol and is unscented) to the skin on your feet and to dry, brittle toenails. Do not apply lotion between your toes.  Trim your toenails straight across. Do not dig under them or around the cuticle. File the edges of your nails with an emery board or nail file.  Do not cut corns or calluses or try to remove them with medicine.  Wear clean socks or stockings every day. Make sure they are not too tight. Do not wear knee-high stockings since they may decrease blood flow to your legs.  Wear shoes that fit properly and have enough cushioning. To break in new shoes, wear them for just a few hours a day. This prevents you from injuring your feet. Always look in your shoes before you put them on to be sure there are no objects inside.  Do not cross your legs. This may decrease the blood flow to your feet.  If you find a minor scrape,  cut, or break in the skin on your feet, keep it and the skin around it clean and dry. These areas may be cleansed with mild soap and water. Do not cleanse the area with peroxide, alcohol, or iodine.  When you remove an adhesive bandage, be sure not to damage the skin around it.  If you have a wound, look at it several times a day to make sure it is healing.  Do not use heating pads or hot water bottles. They may burn your skin. If you have lost feeling in your feet or legs, you may not know it is happening until it is too late.  Make sure your health care provider performs a complete foot exam at least annually or more often if you have foot problems. Report any cuts, sores, or bruises to your health care provider immediately. SEEK MEDICAL CARE IF:   You have an injury that is not healing.  You have cuts or breaks in the skin.  You have an ingrown nail.  You notice redness on your legs or feet.  You feel burning or tingling in your legs or feet.  You have pain or cramps in your legs and feet.  Your legs or feet are numb.  Your feet always feel cold. SEEK IMMEDIATE MEDICAL CARE IF:   There is increasing redness,   swelling, or pain in or around a wound.  There is a red line that goes up your leg.  Pus is coming from a wound.  You develop a fever or as directed by your health care provider.  You notice a bad smell coming from an ulcer or wound. Document Released: 08/22/2000 Document Revised: 04/27/2013 Document Reviewed: 02/01/2013 [employer] Patient Information 2015 [employer]. This information is not intended to replace advice given to you by your health care provider. Make sure you discuss any questions you have with your health care provider.  

## 2014-12-12 NOTE — Progress Notes (Signed)
Patient ID: Antonio Page, male   DOB: 03-Jul-1941, 74 y.o.   MRN: [id number]  Subjective: This patient presents today requesting nail and skin debridement  Objective: Toenails 2-5 bilaterally are elongated, hypertrophic, discolored Bleeding callus sub-second MPJ right that remains closed after debridement  Assessment: History of diabetic peripheral neuropathy Onychomycoses times a Pre-ulcerative plantar callus 1 right  Plan: Debridement of toenails 8 and keratoses 1 without any bleeding Continue wear diabetic shoes on a daily basis  Reappoint 3 months

## 2015-01-08 ENCOUNTER — Inpatient Hospital Stay (HOSPITAL_COMMUNITY)
Admission: EM | Admit: 2015-01-08 | Discharge: 2015-01-12 | DRG: 242 | Disposition: A | Discharge status: Home / Self Care | Payer: Medicare Other | Attending: Internal Medicine | Admitting: Internal Medicine

## 2015-01-08 ENCOUNTER — Encounter (HOSPITAL_COMMUNITY)
Admission: EM | Disposition: A | Discharge status: Home / Self Care | Payer: PRIVATE HEALTH INSURANCE | Source: Home / Self Care | Attending: Internal Medicine

## 2015-01-08 ENCOUNTER — Inpatient Hospital Stay (HOSPITAL_COMMUNITY): Payer: Medicare Other

## 2015-01-08 ENCOUNTER — Encounter (HOSPITAL_COMMUNITY): Payer: Self-pay | Admitting: Cardiology

## 2015-01-08 ENCOUNTER — Emergency Department (HOSPITAL_COMMUNITY): Payer: Medicare Other

## 2015-01-08 DIAGNOSIS — E875 Hyperkalemia: Secondary | ICD-10-CM | POA: Diagnosis not present

## 2015-01-08 DIAGNOSIS — I2510 Atherosclerotic heart disease of native coronary artery without angina pectoris: Secondary | ICD-10-CM

## 2015-01-08 DIAGNOSIS — Z809 Family history of malignant neoplasm, unspecified: Secondary | ICD-10-CM

## 2015-01-08 DIAGNOSIS — J811 Chronic pulmonary edema: Secondary | ICD-10-CM | POA: Diagnosis present

## 2015-01-08 DIAGNOSIS — Z7982 Long term (current) use of aspirin: Secondary | ICD-10-CM

## 2015-01-08 DIAGNOSIS — I442 Atrioventricular block, complete: Principal | ICD-10-CM

## 2015-01-08 DIAGNOSIS — R0902 Hypoxemia: Secondary | ICD-10-CM | POA: Diagnosis present

## 2015-01-08 DIAGNOSIS — Z87891 Personal history of nicotine dependence: Secondary | ICD-10-CM | POA: Diagnosis not present

## 2015-01-08 DIAGNOSIS — I120 Hypertensive chronic kidney disease with stage 5 chronic kidney disease or end stage renal disease: Secondary | ICD-10-CM | POA: Diagnosis not present

## 2015-01-08 DIAGNOSIS — Z992 Dependence on renal dialysis: Secondary | ICD-10-CM | POA: Diagnosis not present

## 2015-01-08 DIAGNOSIS — I872 Venous insufficiency (chronic) (peripheral): Secondary | ICD-10-CM | POA: Diagnosis present

## 2015-01-08 DIAGNOSIS — M1990 Unspecified osteoarthritis, unspecified site: Secondary | ICD-10-CM | POA: Diagnosis present

## 2015-01-08 DIAGNOSIS — Z955 Presence of coronary angioplasty implant and graft: Secondary | ICD-10-CM | POA: Diagnosis not present

## 2015-01-08 DIAGNOSIS — I10 Essential (primary) hypertension: Secondary | ICD-10-CM | POA: Diagnosis present

## 2015-01-08 DIAGNOSIS — Z8249 Family history of ischemic heart disease and other diseases of the circulatory system: Secondary | ICD-10-CM

## 2015-01-08 DIAGNOSIS — R55 Syncope and collapse: Secondary | ICD-10-CM

## 2015-01-08 DIAGNOSIS — I517 Cardiomegaly: Secondary | ICD-10-CM | POA: Diagnosis present

## 2015-01-08 DIAGNOSIS — N186 End stage renal disease: Secondary | ICD-10-CM | POA: Diagnosis present

## 2015-01-08 DIAGNOSIS — D631 Anemia in chronic kidney disease: Secondary | ICD-10-CM

## 2015-01-08 DIAGNOSIS — G9001 Carotid sinus syncope: Secondary | ICD-10-CM | POA: Diagnosis not present

## 2015-01-08 DIAGNOSIS — I4510 Unspecified right bundle-branch block: Secondary | ICD-10-CM | POA: Diagnosis present

## 2015-01-08 DIAGNOSIS — Z794 Long term (current) use of insulin: Secondary | ICD-10-CM | POA: Diagnosis not present

## 2015-01-08 DIAGNOSIS — E119 Type 2 diabetes mellitus without complications: Secondary | ICD-10-CM | POA: Diagnosis present

## 2015-01-08 DIAGNOSIS — Z959 Presence of cardiac and vascular implant and graft, unspecified: Secondary | ICD-10-CM

## 2015-01-08 DIAGNOSIS — Z6839 Body mass index (BMI) 39.0-39.9, adult: Secondary | ICD-10-CM

## 2015-01-08 DIAGNOSIS — N189 Chronic kidney disease, unspecified: Secondary | ICD-10-CM

## 2015-01-08 DIAGNOSIS — H5441 Blindness, right eye, normal vision left eye: Secondary | ICD-10-CM | POA: Diagnosis present

## 2015-01-08 DIAGNOSIS — Z45018 Encounter for adjustment and management of other part of cardiac pacemaker: Secondary | ICD-10-CM

## 2015-01-08 DIAGNOSIS — M109 Gout, unspecified: Secondary | ICD-10-CM | POA: Diagnosis present

## 2015-01-08 DIAGNOSIS — G4733 Obstructive sleep apnea (adult) (pediatric): Secondary | ICD-10-CM

## 2015-01-08 DIAGNOSIS — K219 Gastro-esophageal reflux disease without esophagitis: Secondary | ICD-10-CM | POA: Diagnosis present

## 2015-01-08 DIAGNOSIS — D860 Sarcoidosis of lung: Secondary | ICD-10-CM | POA: Diagnosis present

## 2015-01-08 DIAGNOSIS — I459 Conduction disorder, unspecified: Secondary | ICD-10-CM | POA: Diagnosis not present

## 2015-01-08 DIAGNOSIS — H409 Unspecified glaucoma: Secondary | ICD-10-CM | POA: Diagnosis present

## 2015-01-08 DIAGNOSIS — J9811 Atelectasis: Secondary | ICD-10-CM | POA: Diagnosis present

## 2015-01-08 HISTORY — DX: Atrioventricular block, complete: I44.2

## 2015-01-08 HISTORY — PX: CARDIAC CATHETERIZATION: SHX172

## 2015-01-08 HISTORY — DX: Hypoxemia: R09.02

## 2015-01-08 LAB — MAGNESIUM: MAGNESIUM: 2 mg/dL (ref 1.7–2.4)

## 2015-01-08 LAB — BASIC METABOLIC PANEL
ANION GAP: 16 — AB (ref 5–15)
BUN: 60 mg/dL — ABNORMAL HIGH (ref 6–20)
CHLORIDE: 101 mmol/L (ref 101–111)
CO2: 26 mmol/L (ref 22–32)
CREATININE: 9.91 mg/dL — AB (ref 0.61–1.24)
Calcium: 8.9 mg/dL (ref 8.9–10.3)
GFR calc Af Amer: 5 mL/min — ABNORMAL LOW (ref >60)
GFR, EST NON AFRICAN AMERICAN: 4 mL/min — AB (ref >60)
Glucose, Bld: 153 mg/dL — ABNORMAL HIGH (ref 70–99)
Potassium: 5.5 mmol/L — ABNORMAL HIGH (ref 3.5–5.1)
SODIUM: 143 mmol/L (ref 135–145)

## 2015-01-08 LAB — I-STAT CHEM 8, ED
BUN: 65 mg/dL — AB (ref 6–20)
Calcium, Ion: 1.15 mmol/L (ref 1.13–1.30)
Chloride: 105 mmol/L (ref 101–111)
Creatinine, Ser: 9.4 mg/dL — ABNORMAL HIGH (ref 0.61–1.24)
Glucose, Bld: 175 mg/dL — ABNORMAL HIGH (ref 70–99)
HEMATOCRIT: 37 % — AB (ref 39.0–52.0)
Hemoglobin: 12.6 g/dL — ABNORMAL LOW (ref 13.0–17.0)
Potassium: 6.2 mmol/L (ref 3.5–5.1)
Sodium: 141 mmol/L (ref 135–145)
TCO2: 23 mmol/L (ref 0–100)

## 2015-01-08 LAB — MRSA PCR SCREENING: MRSA BY PCR: NEGATIVE

## 2015-01-08 LAB — CBC WITH DIFFERENTIAL/PLATELET
Basophils Absolute: 0 10*3/uL (ref 0.0–0.1)
Basophils Relative: 0 % (ref 0–1)
EOS PCT: 4 % (ref 0–5)
Eosinophils Absolute: 0.3 10*3/uL (ref 0.0–0.7)
HCT: 33.7 % — ABNORMAL LOW (ref 39.0–52.0)
HEMOGLOBIN: 10.9 g/dL — AB (ref 13.0–17.0)
Lymphocytes Relative: 17 % (ref 12–46)
Lymphs Abs: 1.1 10*3/uL (ref 0.7–4.0)
MCH: 31.1 pg (ref 26.0–34.0)
MCHC: 32.3 g/dL (ref 30.0–36.0)
MCV: 96.3 fL (ref 78.0–100.0)
MONO ABS: 0.4 10*3/uL (ref 0.1–1.0)
Monocytes Relative: 6 % (ref 3–12)
Neutro Abs: 4.6 10*3/uL (ref 1.7–7.7)
Neutrophils Relative %: 73 % (ref 43–77)
Platelets: 107 10*3/uL — ABNORMAL LOW (ref 150–400)
RBC: 3.5 MIL/uL — ABNORMAL LOW (ref 4.22–5.81)
RDW: 16.5 % — ABNORMAL HIGH (ref 11.5–15.5)
WBC: 6.3 10*3/uL (ref 4.0–10.5)

## 2015-01-08 LAB — GLUCOSE, CAPILLARY
Glucose-Capillary: 193 mg/dL — ABNORMAL HIGH (ref 70–99)
Glucose-Capillary: 115 mg/dL — ABNORMAL HIGH (ref 70–99)

## 2015-01-08 LAB — CBG MONITORING, ED
GLUCOSE-CAPILLARY: 157 mg/dL — AB (ref 70–99)
Glucose-Capillary: 156 mg/dL — ABNORMAL HIGH (ref 70–99)

## 2015-01-08 LAB — POTASSIUM: Potassium: 6 mmol/L — ABNORMAL HIGH (ref 3.5–5.1)

## 2015-01-08 LAB — TROPONIN I: TROPONIN I: 0.03 ng/mL (ref <0.031)

## 2015-01-08 LAB — TSH: TSH: 3.422 u[IU]/mL (ref 0.350–4.500)

## 2015-01-08 SURGERY — TEMPORARY PACEMAKER
Anesthesia: LOCAL

## 2015-01-08 MED ORDER — SEVELAMER CARBONATE 800 MG PO TABS
1600.0000 mg | ORAL_TABLET | Freq: Three times a day (TID) | ORAL | Status: DC
  Administered 2015-01-08: 1600 mg via ORAL
  Administered 2015-01-09: 800 mg via ORAL
  Administered 2015-01-09 – 2015-01-12 (×8): 1600 mg via ORAL
  Filled 2015-01-08 (×11): qty 2

## 2015-01-08 MED ORDER — INSULIN ASPART 100 UNIT/ML ~~LOC~~ SOLN: 0.0000 [IU] | Freq: Every day | SUBCUTANEOUS | Status: DC

## 2015-01-08 MED ORDER — DOPAMINE-DEXTROSE 3.2-5 MG/ML-% IV SOLN
0.0000 ug/kg/min | INTRAVENOUS | Status: DC
  Administered 2015-01-08: 2 ug/kg/min via INTRAVENOUS
  Filled 2015-01-08: qty 250

## 2015-01-08 MED ORDER — PENTAFLUOROPROP-TETRAFLUOROETH EX AERO: 1.0000 "application " | INHALATION_SPRAY | CUTANEOUS | Status: DC | PRN

## 2015-01-08 MED ORDER — SODIUM CHLORIDE 0.9 % IR SOLN
80.0000 mg | Status: DC
  Filled 2015-01-08: qty 2

## 2015-01-08 MED ORDER — SODIUM CHLORIDE 0.9 % IJ SOLN: 3.0000 mL | INTRAMUSCULAR | Status: DC | PRN

## 2015-01-08 MED ORDER — ONDANSETRON HCL 4 MG/2ML IJ SOLN
4.0000 mg | Freq: Four times a day (QID) | INTRAMUSCULAR | Status: DC | PRN
  Administered 2015-01-08: 4 mg via INTRAVENOUS
  Filled 2015-01-08: qty 2

## 2015-01-08 MED ORDER — HEPARIN SODIUM (PORCINE) 1000 UNIT/ML DIALYSIS
1000.0000 [IU] | INTRAMUSCULAR | Status: DC | PRN
  Filled 2015-01-08: qty 1

## 2015-01-08 MED ORDER — LOSARTAN POTASSIUM 50 MG PO TABS: 100.0000 mg | ORAL_TABLET | Freq: Every day | ORAL | Status: DC

## 2015-01-08 MED ORDER — RENA-VITE PO TABS
1.0000 | ORAL_TABLET | Freq: Every day | ORAL | Status: DC
Start: 2015-01-08 — End: 2015-01-12
  Administered 2015-01-08 – 2015-01-11 (×3): 1 via ORAL
  Filled 2015-01-08 (×5): qty 1

## 2015-01-08 MED ORDER — INSULIN ASPART 100 UNIT/ML ~~LOC~~ SOLN: 0.0000 [IU] | Freq: Three times a day (TID) | SUBCUTANEOUS | Status: DC

## 2015-01-08 MED ORDER — ALPRAZOLAM 0.25 MG PO TABS
0.2500 mg | ORAL_TABLET | Freq: Two times a day (BID) | ORAL | Status: DC | PRN
  Administered 2015-01-08 – 2015-01-10 (×2): 0.25 mg via ORAL
  Filled 2015-01-08: qty 1

## 2015-01-08 MED ORDER — INSULIN ASPART 100 UNIT/ML ~~LOC~~ SOLN
0.0000 [IU] | Freq: Three times a day (TID) | SUBCUTANEOUS | Status: DC
  Administered 2015-01-08 – 2015-01-10 (×2): 2 [IU] via SUBCUTANEOUS
  Administered 2015-01-11: 1 [IU] via SUBCUTANEOUS
  Administered 2015-01-11: 2 [IU] via SUBCUTANEOUS

## 2015-01-08 MED ORDER — ONDANSETRON HCL 4 MG/2ML IJ SOLN
INTRAMUSCULAR | Status: AC
  Administered 2015-01-08: 4 mg via INTRAVENOUS
  Filled 2015-01-08: qty 2

## 2015-01-08 MED ORDER — ONDANSETRON HCL 4 MG PO TABS: 4.0000 mg | ORAL_TABLET | Freq: Four times a day (QID) | ORAL | Status: DC | PRN

## 2015-01-08 MED ORDER — POLYETHYLENE GLYCOL 3350 17 G PO PACK
17.0000 g | PACK | ORAL | Status: DC
  Administered 2015-01-11: 17 g via ORAL
  Filled 2015-01-08 (×2): qty 1

## 2015-01-08 MED ORDER — ENOXAPARIN SODIUM 30 MG/0.3ML ~~LOC~~ SOLN: 30.0000 mg | SUBCUTANEOUS | Status: DC

## 2015-01-08 MED ORDER — SODIUM CHLORIDE 0.9 % IV SOLN: 100.0000 mL | INTRAVENOUS | Status: DC | PRN

## 2015-01-08 MED ORDER — LIDOCAINE HCL (PF) 1 % IJ SOLN
INTRAMUSCULAR | Status: AC
  Filled 2015-01-08: qty 30

## 2015-01-08 MED ORDER — ZOLPIDEM TARTRATE 5 MG PO TABS: 5.0000 mg | ORAL_TABLET | Freq: Every evening | ORAL | Status: DC | PRN

## 2015-01-08 MED ORDER — SODIUM CHLORIDE 0.9 % IV SOLN: 250.0000 mL | INTRAVENOUS | Status: DC | PRN

## 2015-01-08 MED ORDER — LORATADINE 10 MG PO TABS
10.0000 mg | ORAL_TABLET | Freq: Every day | ORAL | Status: DC
Start: 2015-01-08 — End: 2015-01-12
  Administered 2015-01-08 – 2015-01-12 (×5): 10 mg via ORAL
  Filled 2015-01-08 (×5): qty 1

## 2015-01-08 MED ORDER — HEPARIN SODIUM (PORCINE) 5000 UNIT/ML IJ SOLN
5000.0000 [IU] | Freq: Three times a day (TID) | INTRAMUSCULAR | Status: DC
  Administered 2015-01-08 – 2015-01-10 (×4): 5000 [IU] via SUBCUTANEOUS
  Filled 2015-01-08 (×8): qty 1

## 2015-01-08 MED ORDER — IOHEXOL 350 MG/ML SOLN
100.0000 mL | Freq: Once | INTRAVENOUS | Status: AC | PRN
  Administered 2015-01-08: 100 mL via INTRAVENOUS

## 2015-01-08 MED ORDER — CALCIUM CHLORIDE 10 % IV SOLN
INTRAVENOUS | Status: AC
Start: 2015-01-08 — End: 2015-01-08
  Administered 2015-01-08: 1 g via INTRAVENOUS
  Filled 2015-01-08: qty 10

## 2015-01-08 MED ORDER — NEPRO/CARBSTEADY PO LIQD
237.0000 mL | ORAL | Status: DC | PRN
  Filled 2015-01-08: qty 237

## 2015-01-08 MED ORDER — CALCIUM CHLORIDE 10 % IV SOLN
INTRAVENOUS | Status: AC
  Filled 2015-01-08: qty 10

## 2015-01-08 MED ORDER — INSULIN ASPART PROT & ASPART (70-30 MIX) 100 UNIT/ML ~~LOC~~ SUSP
40.0000 [IU] | Freq: Every day | SUBCUTANEOUS | Status: DC
  Administered 2015-01-09: 40 [IU] via SUBCUTANEOUS
  Filled 2015-01-08: qty 10

## 2015-01-08 MED ORDER — ACETAMINOPHEN 325 MG PO TABS: 650.0000 mg | ORAL_TABLET | ORAL | Status: DC | PRN

## 2015-01-08 MED ORDER — ONDANSETRON HCL 4 MG/2ML IJ SOLN
4.0000 mg | Freq: Once | INTRAMUSCULAR | Status: AC
  Administered 2015-01-08: 4 mg via INTRAVENOUS

## 2015-01-08 MED ORDER — ALTEPLASE 2 MG IJ SOLR
2.0000 mg | Freq: Once | INTRAMUSCULAR | Status: AC | PRN
  Filled 2015-01-08: qty 2

## 2015-01-08 MED ORDER — LIDOCAINE HCL (PF) 1 % IJ SOLN: 5.0000 mL | INTRAMUSCULAR | Status: DC | PRN

## 2015-01-08 MED ORDER — SODIUM CHLORIDE 0.9 % IJ SOLN
3.0000 mL | Freq: Two times a day (BID) | INTRAMUSCULAR | Status: DC
  Administered 2015-01-08 – 2015-01-09 (×3): 3 mL via INTRAVENOUS

## 2015-01-08 MED ORDER — CALCIUM CHLORIDE 10 % IV SOLN
1.0000 g | Freq: Once | INTRAVENOUS | Status: AC
  Administered 2015-01-08: 1 g via INTRAVENOUS

## 2015-01-08 MED ORDER — CALCIUM CARBONATE ANTACID 500 MG PO CHEW
2.0000 | CHEWABLE_TABLET | Freq: Three times a day (TID) | ORAL | Status: DC
  Filled 2015-01-08 (×6): qty 2

## 2015-01-08 MED ORDER — ONDANSETRON HCL 4 MG/2ML IJ SOLN: 4.0000 mg | Freq: Four times a day (QID) | INTRAMUSCULAR | Status: DC | PRN

## 2015-01-08 MED ORDER — SODIUM CHLORIDE 0.9 % IJ SOLN
3.0000 mL | INTRAMUSCULAR | Status: DC | PRN
Start: 2015-01-08 — End: 2015-01-12

## 2015-01-08 MED ORDER — PANTOPRAZOLE SODIUM 40 MG PO TBEC
40.0000 mg | DELAYED_RELEASE_TABLET | Freq: Every day | ORAL | Status: DC
  Administered 2015-01-09 – 2015-01-12 (×4): 40 mg via ORAL
  Filled 2015-01-08 (×4): qty 1

## 2015-01-08 MED ORDER — ALLOPURINOL 300 MG PO TABS
150.0000 mg | ORAL_TABLET | Freq: Every day | ORAL | Status: DC
  Administered 2015-01-08 – 2015-01-12 (×5): 150 mg via ORAL
  Filled 2015-01-08 (×5): qty 1

## 2015-01-08 MED ORDER — LIDOCAINE-PRILOCAINE 2.5-2.5 % EX CREA
1.0000 "application " | TOPICAL_CREAM | CUTANEOUS | Status: DC | PRN
  Filled 2015-01-08: qty 5

## 2015-01-08 MED ORDER — AMLODIPINE BESYLATE 10 MG PO TABS
10.0000 mg | ORAL_TABLET | Freq: Every day | ORAL | Status: DC
  Administered 2015-01-09 – 2015-01-12 (×5): 10 mg via ORAL
  Filled 2015-01-08 (×5): qty 1

## 2015-01-08 MED ORDER — SODIUM CHLORIDE 0.9 % IV SOLN
INTRAVENOUS | Status: DC
  Administered 2015-01-08: 15:00:00 via INTRAVENOUS

## 2015-01-08 MED ORDER — ASPIRIN EC 81 MG PO TBEC
81.0000 mg | DELAYED_RELEASE_TABLET | Freq: Every day | ORAL | Status: DC
  Administered 2015-01-08 – 2015-01-12 (×5): 81 mg via ORAL
  Filled 2015-01-08 (×5): qty 1

## 2015-01-08 MED ORDER — SALINE SPRAY 0.65 % NA SOLN
1.0000 | Freq: Every day | NASAL | Status: DC | PRN
  Administered 2015-01-09 – 2015-01-11 (×3): 1 via NASAL
  Filled 2015-01-08 (×2): qty 44

## 2015-01-08 MED ORDER — DEXTROSE 5 % IV SOLN
3.0000 g | INTRAVENOUS | Status: DC
  Filled 2015-01-08: qty 3000

## 2015-01-08 MED ORDER — SODIUM CHLORIDE 0.9 % IJ SOLN
3.0000 mL | Freq: Two times a day (BID) | INTRAMUSCULAR | Status: DC
  Administered 2015-01-08 – 2015-01-11 (×7): 3 mL via INTRAVENOUS

## 2015-01-08 MED ORDER — HYDRALAZINE HCL 20 MG/ML IJ SOLN
10.0000 mg | INTRAMUSCULAR | Status: DC | PRN
  Administered 2015-01-08: 10 mg via INTRAVENOUS
  Filled 2015-01-08 (×2): qty 1

## 2015-01-08 MED ORDER — NITROGLYCERIN 0.4 MG SL SUBL: 0.4000 mg | SUBLINGUAL_TABLET | SUBLINGUAL | Status: DC | PRN

## 2015-01-08 SURGICAL SUPPLY — 6 items
CATH S G BIP PACING (SET/KITS/TRAYS/PACK) ×2 IMPLANT
KIT HEART LEFT (KITS) ×2 IMPLANT
PACK CARDIAC CATHETERIZATION (CUSTOM PROCEDURE TRAY) ×2 IMPLANT
SHEATH PINNACLE 6F 10CM (SHEATH) ×2 IMPLANT
SLEEVE REPOSITIONING LENGTH 30 (MISCELLANEOUS) ×2 IMPLANT
TRANSDUCER W/STOPCOCK (MISCELLANEOUS) ×2 IMPLANT

## 2015-01-08 NOTE — Progress Notes (Signed)
  Echocardiogram 2D Echocardiogram has been performed.  Johny Chess 01/08/2015, 1:33 PM

## 2015-01-08 NOTE — ED Notes (Signed)
Cath lab and X-ray called to get supplies to set up for insertion of a temporary pacemaker.

## 2015-01-08 NOTE — ED Notes (Signed)
Called pharmacy x 2 about dopamine drip. Stated they would prepare it and send it.

## 2015-01-08 NOTE — Care Management Note (Signed)
Case Management Note  Patient Details  Name: Antonio Page MRN: [id number] Date of Birth: April 05, 1941  Subjective/Objective:       Adm w syncope             Action/Plan:lives w fam, pcp dr Burnard Bunting   Expected Discharge Date:                  Expected Discharge Plan:  Home/Self Care  In-House Referral:     Discharge planning Services     Post Acute Care Choice:    Choice offered to:     DME Arranged:    DME Agency:     HH Arranged:    [place] Agency:     Status of Service:     Medicare Important Message Given:    Date Medicare IM Given:    Medicare IM give by:    Date Additional Medicare IM Given:    Additional Medicare Important Message give by:     If discussed at [place] of Stay Meetings, dates discussed:    Additional Comments:  Lacretia Leigh, RN 01/08/2015, 3:18 PM

## 2015-01-08 NOTE — Progress Notes (Signed)
Pt continues to c/o SOB. He said " I just can't catch my breath." Lungs are clear and diminished, respirations 15-20 shallow, unlabored, symmetrical. O2 sats 92-95% on 2L [place]. Dr Sallyanne Kuster ordered post temp pacer CXR, and requested RN to call rounding MD. Rosaria Ferries PA paged, and did not want to order lasix due to pt's renal function. Recommended increasing O2. HD called to know when pt will be getting dialyzed. They said it would not be until later today or tomorrow. Dr. Jonnie Finner with nephrology paged at [phone]. At 1617 he returned paged and updated on pt's worsening SOB. Dr. Jonnie Finner said " I will see what I can do." RN received phone call at [phone] from HD informing RN they will be coming to unit shortly to start HD on pt. Will continue to monitor pt closely.

## 2015-01-08 NOTE — ED Notes (Addendum)
Pt's family called out saying they needed help, patient found slumped on bed and unresponsive.  No pulse noted and asystole on Cardiac Monitor noted.  Charlett Nose EMT began compressions and Dr. Regenia Skeeter and ED Resident brought to bedside.

## 2015-01-08 NOTE — Progress Notes (Signed)
Pt requesting to stopped hemodialysis Tx after running for 2hr 6min. States he feels it is too much for him today and he is afraid he will not do well with it. Tried to explain to the pt that running longer at this point is a milder way of doing his Tx. Still wants me to stop the Tx. Pt is afraid of having problems. Dr. Marval Regal called. Pt informed that Dr. Marval Regal feels it would be best for him to finish his Tx and get some fluid off to help with the healing of his heart. Explained this to the pt and he states he will try.

## 2015-01-08 NOTE — Progress Notes (Signed)
Temporary Pacemaker battery change today at 1500. Time and date noted in pacer.

## 2015-01-08 NOTE — H&P (Signed)
Primary Physician:  Geoffery Lyons, MD  Primary Cardiologist: Dr Gwenlyn Found  Chief Complaint: Syncope  HPI: 74 yo male w/ hx sarcoid, CAD w/ RCA stent '99, ESRD on HD M/W/Fri, nl LV, HTN, DM and OSA on CPAP, who came to the ER with multiple episodes of syncope this am.    Having coffee with wife  Took a few sips  Then wife says his eyes rolled back in head  Not responsive.  Patient doesn't remember much about this but was not having chest pain, SOB, or palpitations.   When responsive, he just didn't feel right  Was supposed to go to dialysis but came to ER instead. In admitting area was in chair  Wife said did it again  Seizurelike activity this time.   Later on telemetry, he had a 9 second ventricular pause with P waves. He now has external pacing pads in place. He has had other pauses, dropped 2-3 QRS complexes, but did not lose consciousness with these.   He feels weak and light-headed. Has not eaten since 5:30 am, and has not had am medications yet. The light-headed feeling is worse when he lies back. No chest pain except his chronic lower sternal chest wall tenderness.   At [place] he had a syncopal spell one night  Was in [place]  He doesn't remember much  Wirfe said he was on toilet.  Passed out and fell offr  She attrib to fatigue  He has not had any spells like that since  He has chronic fatigue  On dialysis.  Chronic intermitent CP that is pleuritic, worse with cough, chest wall tender. Mild now. Cause not clear.  Dr Gwenlyn Found aware    Hx of pulmonary sarcoid for years  Followed by Dr Gwenette Greet. Has not been bad.   Past Medical History  Diagnosis Date  . Hypertension   . Sleep apnea     uses a cpap  . Diabetes mellitus   . GERD (gastroesophageal reflux disease)   . Arthritis   . Glaucoma   . Dialysis patient   . Blind right eye   . Chronic kidney disease     dialysis  . Kidney stones   . Coronary artery disease    Past Surgical History  Procedure Laterality Date  . Dg av  dialysis  shunt access exist*l* or  2010    left upper arm-not using  . Dg av dialysis  shunt access exist*l* or  2012    right upper arm-using this one  . Shoulder arthroscopy      right  . Knee arthroscopy      left  . Tonsillectomy    . Eye surgery      multiple rt eye surgeries  . Coronary stent placement    . Trigger finger release  06/08/2012    Procedure: RELEASE TRIGGER FINGER/A-1 PULLEY;  Surgeon: Cammie Sickle., MD;  Location: [place];  Service: Orthopedics;  Laterality: Right;  . Dupuytren contracture release  06/08/2012    Procedure: DUPUYTREN CONTRACTURE RELEASE;  Surgeon: Cammie Sickle., MD;  Location: [place];  Service: Orthopedics;  Laterality: Right;  right ring fascia excision with A1 Pulley right ring  . Cystoscopy with retrograde pyelogram, ureteroscopy and stent placement  07/21/2012    Procedure: [place], URETEROSCOPY AND STENT PLACEMENT;  Surgeon: Malka So, MD;  Location: [facility];  Service: Urology;  Laterality: Right;  . 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 Constance Holster   . Tee without cardioversion N/A 03/31/2013    Procedure: TRANSESOPHAGEAL ECHOCARDIOGRAM (TEE);  Surgeon: Sanda Klein, MD;  Location[facility];  Service: Cardiovascular;  Laterality: N/A;  . Revision of arteriovenous goretex graft Right 10/19/2013    Procedure: REVISION OF ARTERIOVENOUS GORETEX GRAFT - thrombectomy;  Surgeon: Rosetta Posner, MD;  Location: [facility];  Service: Vascular;  Laterality: Right;   Medication Sig  allopurinol (ZYLOPRIM) 300 MG tablet Take 150 mg by mouth daily.   amLODipine (NORVASC) 10 MG tablet Take 10 mg by mouth daily.  aspirin EC 81 MG tablet Take 81 mg by mouth daily.  b complex-vitamin c-folic  acid (NEPHRO-VITE) 0.8 MG TABS Take 0.8 mg by mouth daily.  fexofenadine (ALLEGRA) 180 MG tablet Take 180 mg by mouth daily.  insulin NPH-insulin regular (NOVOLIN 70/30) (70-30) 100 UNIT/ML injection Inject 40 Units into the skin daily with breakfast.   losartan (COZAAR) 100 MG tablet Take 100 mg by mouth daily. Once daily  omeprazole (PRILOSEC) 20 MG capsule Take 40 mg by mouth daily.   polyethylene glycol (MIRALAX / GLYCOLAX) packet Take 17 g by mouth daily. Does not take on days of dialysis.  RENVELA 800 MG tablet Take 1,600 mg by mouth 3 (three) times daily with meals. 2 each meal  sodium chloride (OCEAN) 0.65 % SOLN nasal spray Place 1 spray into the nose daily as needed for congestion.  calcium carbonate (TUMS - DOSED IN MG ELEMENTAL CALCIUM) 500 MG chewable tablet Chew 2 tablets by mouth 3 (three) times daily with meals.   . insulin aspart  0-5 Units Subcutaneous QHS  . insulin aspart  0-9 Units Subcutaneous TID WC   No Known Allergies  History   Social History  . Marital Status: Married    Spouse Name: N/A  . Number of Children: N/A  . Years of Education: N/A   Occupational History  . Retired    Social History Main Topics  . Smoking status: Former Smoker    Types: Cigars    Quit date: 06/08/1995  . Smokeless tobacco: Not on file  . Alcohol Use: No  . Drug Use: No  . Sexual Activity: Not on file   Other Topics Concern  . Not on file   Social History Narrative   Lives in [place] with wife.   Family History  Problem Relation Age of Onset  . Heart disease Father   . Kidney disease Father   . Hypertension Mother   . Cancer Brother   . Hypertension Brother    Family Status  Relation Status Death Age  . Maternal Grandmother Deceased   . Maternal Grandfather Deceased   . Paternal Grandmother Deceased   . Paternal Grandfather Deceased    REVIEW OF SYSTEMS:  All systems reviewed  Negative to the above problem except as noted above.    PHYSICAL  EXAM: Filed Vitals:   01/08/15 1140  BP: 172/79  Pulse: 33  Temp:   Resp: 15   General:  Weak appearing. 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 no JVD. Carotids 2+ bilat; no bruits. No lymphadenopathy or thryomegaly appreciated. Cor: PMI nondisplaced. Regular rate & rhythm. No rubs, gallops or murmurs. Lungs: rales bases Abdomen: soft, nontender,  nondistended. No hepatosplenomegaly. No bruits or masses. Good bowel sounds. Extremities: no cyanosis, clubbing, rash, trace edema; HD access RUE with palpable thrill Neuro: alert & oriented x 3, cranial nerves grossly intact. moves all 4 extremities w/o difficulty. Affect pleasant.  ECG: 01/08/2015 SR, RBBB, LPFB. 1st deg AVB  QTx 502 msec    at 11:35  3rd degree AV block  With PVCs    Results for orders placed or performed during the hospital encounter of 01/08/15 (from the past 24 hour(s))  CBG monitoring, ED     Status: Abnormal   Collection Time: 01/08/15  7:57 AM  Result Value Ref Range   Glucose-Capillary 157 (H) 70 - 99 mg/dL  Basic metabolic panel     Status: Abnormal   Collection Time: 01/08/15  8:07 AM  Result Value Ref Range   Sodium 143 135 - 145 mmol/L   Potassium 5.5 (H) 3.5 - 5.1 mmol/L   Chloride 101 101 - 111 mmol/L   CO2 26 22 - 32 mmol/L   Glucose, Bld 153 (H) 70 - 99 mg/dL   BUN 60 (H) 6 - 20 mg/dL   Creatinine, Ser 9.91 (H) 0.61 - 1.24 mg/dL   Calcium 8.9 8.9 - 10.3 mg/dL   GFR calc non Af Amer 4 (L) >60 mL/min   GFR calc Af Amer 5 (L) >60 mL/min   Anion gap 16 (H) 5 - 15  CBC with Differential     Status: Abnormal   Collection Time: 01/08/15  8:07 AM  Result Value Ref Range   WBC 6.3 4.0 - 10.5 K/uL   RBC 3.50 (L) 4.22 - 5.81 MIL/uL   Hemoglobin 10.9 (L) 13.0 - 17.0 g/dL   HCT 33.7 (L) 39.0 - 52.0 %   MCV 96.3 78.0 - 100.0 fL   MCH 31.1 26.0 - 34.0 pg   MCHC 32.3 30.0 - 36.0 g/dL   RDW 16.5 (H) 11.5 - 15.5 %   Platelets 107 (L) 150 - 400 K/uL   Neutrophils  Relative % 73 43 - 77 %   Neutro Abs 4.6 1.7 - 7.7 K/uL   Lymphocytes Relative 17 12 - 46 %   Lymphs Abs 1.1 0.7 - 4.0 K/uL   Monocytes Relative 6 3 - 12 %   Monocytes Absolute 0.4 0.1 - 1.0 K/uL   Eosinophils Relative 4 0 - 5 %   Eosinophils Absolute 0.3 0.0 - 0.7 K/uL   Basophils Relative 0 0 - 1 %   Basophils Absolute 0.0 0.0 - 0.1 K/uL  Troponin I     Status: None   Collection Time: 01/08/15  8:07 AM  Result Value Ref Range   Troponin I 0.03 <0.031 ng/mL  CBG monitoring, ED     Status: Abnormal   Collection Time: 01/08/15 11:46 AM  Result Value Ref Range   Glucose-Capillary 156 (H) 70 - 99 mg/dL   Dg Chest 2 View 01/08/2015   CLINICAL DATA:  74 year old male with syncope and malaise. Current history of dialysis. Initial encounter.  EXAM: CHEST  2 VIEW  COMPARISON:  10/03/2014 and earlier.  FINDINGS: Increased pulmonary vascular congestion. Mildly lower lung volumes with chronic elevation of the left hemidiaphragm. Chronic scarring or atelectasis at the left lung base. No pneumothorax. Stable cardiac size and mediastinal contours. No consolidation or pleural effusion. No acute osseous abnormality identified. Visualized tracheal air column is within normal limits. Right subclavian vascular stent re- identified.  IMPRESSION: Increased vascular congestion/mild interstitial edema.  No pleural effusion or  other acute cardiopulmonary process identified. Chronic elevation of the left hemidiaphragm with adjacent scarring/atelectasis.   Electronically Signed   By: Genevie Ann M.D.   On: 01/08/2015 09:10   Ct Head Wo Contrast 01/08/2015   CLINICAL DATA:  Two syncope episodes this morning.  EXAM: CT HEAD WITHOUT CONTRAST  TECHNIQUE: Contiguous axial images were obtained from the base of the skull through the vertex without contrast.  COMPARISON:  None  FINDINGS: There is low density throughout the periventricular white matter suggesting chronic changes. There is mild cerebral atrophy. No evidence for acute  hemorrhage, mass lesion, midline shift, hydrocephalus or large infarct. The right globe is dense with postoperative changes. Small amount of fluid in the right mastoid air cells. Mucosal thickening and possibly fluid in left sphenoid sinus. No acute bone abnormality.  IMPRESSION: No acute intracranial abnormality.  Mild atrophy and evidence for chronic small vessel ischemic changes.  Left sphenoid sinus disease.   Electronically Signed   By: Markus Daft M.D.   On: 01/08/2015 09:40    ASSESSMENT/PLAN: Principal Problem:   Syncope - EKG with complete HB  Transient   - not on rate-lowering meds, has not had RX today  - keep external pacing pads in place - Note K is 5.5  Repeat 6.2 - will ck TSH   Active Problems:   Morbid obesity - dietary compliance    OSA (obstructive sleep apnea) - continue CPAP    Diabetes mellitus type 2, insulin dependent - add SSI - continue home rx    Uncontrolled hypertension - IV hydralazine for now - goal SBP < 170    Coronary artery disease - no ischemic sx, continue home rx    End stage renal disease on dialysis - Dr Melvia Heaps has seen - HD today once cardiac issues stabilized, they will f/u on this     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 Patient with intermitt 3rd degree AV block with syncope   K 5.5 then 6.2  Plan for temp pacer with close f/u of heart rhythm as electrolytes normalize  2  Htn  Will follow.    Dorris Carnes

## 2015-01-08 NOTE — ED Provider Notes (Signed)
CSN: [id number]     Arrival date & time 01/08/15  0732 History   First MD Initiated Contact with Patient 01/08/15 0757     Chief Complaint  Patient presents with  . Loss of Consciousness  . Seizures     (Consider location/radiation/quality/duration/timing/severity/associated sxs/prior Treatment) HPI  74 year old male presents after passing out twice this morning. He slept at his mother's house on the couch like he typically does once per week to help take care of her. He did not get much sleep last night while he was talking to his wife she knows that his eyes rolled to the back was had any passed out for a couple seconds. He states right before this he felt a warm feeling in the back of his head. He was then driving home and started to feel bad again so he drove to the hospital. While his wife was checking him in he was sitting and he started to feel a warm feeling in his head again and then passed out. The wife states that all 4 extremities were shaking equally for about 1 minute. The patient then quickly regained consciousness. He states he doesn't feel quite well right now but does not have the feeling in his head. He has been short of breath since last night and is due to get dialysis today. He's been having lower extreme any swelling but this is not different than typical. Denies chest pain.  Past Medical History  Diagnosis Date  . Hypertension   . Sleep apnea     uses a cpap  . Diabetes mellitus   . GERD (gastroesophageal reflux disease)   . Arthritis   . Glaucoma   . Dialysis patient   . Blind right eye   . Chronic kidney disease     dialysis  . Kidney stones   . Coronary artery disease    Past Surgical History  Procedure Laterality Date  . Dg av dialysis  shunt access exist*l* or  2010    left upper arm-not using  . Dg av dialysis  shunt access exist*l* or  2012    right upper arm-using this one  . Shoulder arthroscopy      right  . Knee arthroscopy      left  .  Tonsillectomy    . Eye surgery      multiple rt eye surgeries  . Coronary stent placement    . Trigger finger release  06/08/2012    Procedure: RELEASE TRIGGER FINGER/A-1 PULLEY;  Surgeon: Cammie Sickle., MD;  Location: [facility];  Service: Orthopedics;  Laterality: Right;  . Dupuytren contracture release  06/08/2012    Procedure: DUPUYTREN CONTRACTURE RELEASE;  Surgeon: Cammie Sickle., MD;  Location: [facility];  Service: Orthopedics;  Laterality: Right;  right ring fascia excision with A1 Pulley right ring  . Cystoscopy with retrograde pyelogram, ureteroscopy and stent placement  07/21/2012    Procedure: [place], URETEROSCOPY AND STENT PLACEMENT;  Surgeon: Malka So, MD;  Location: [facility];  Service: Urology;  Laterality: Right;  . Mass excision  07/26/2012    Procedure: MINOR EXCISION OF MASS;  Surgeon: Izora Gala, MD;  Location: [facility];  Service: ENT;  Laterality: Right;  Excision of a Right Ear Skin Cancer with Primary Closure  wound class per Dr. Constance Holster   . Tee without cardioversion N/A 03/31/2013    Procedure: TRANSESOPHAGEAL ECHOCARDIOGRAM (TEE);  Surgeon: Sanda Klein, MD;  Location: [facility];  Service: Cardiovascular;  Laterality: N/A;  . Revision of arteriovenous goretex graft Right 10/19/2013    Procedure: REVISION OF ARTERIOVENOUS GORETEX GRAFT - thrombectomy;  Surgeon: Rosetta Posner, MD;  Location: [facility];  Service: Vascular;  Laterality: Right;   Family History  Problem Relation Age of Onset  . Heart disease Father   . Kidney disease Father   . Hypertension Mother   . Cancer Brother   . Hypertension Brother    History  Substance Use Topics  . Smoking status: Former Smoker    Types: Cigars    Quit date: 06/08/1995  . Smokeless tobacco: Not on file  . Alcohol Use: No    Review of Systems  Constitutional: Negative for fever.  Respiratory: Positive for shortness of breath.    Cardiovascular: Positive for leg swelling. Negative for chest pain.  Gastrointestinal: Negative for vomiting and abdominal pain.  Neurological: Positive for dizziness and syncope. Negative for headaches.  All other systems reviewed and are negative.     Allergies  Review of patient's allergies indicates no known allergies.  Home Medications   Prior to Admission medications   Medication Sig Start Date End Date Taking? Authorizing Provider  allopurinol (ZYLOPRIM) 300 MG tablet Take 150 mg by mouth daily.     Historical Provider, MD  amLODipine (NORVASC) 10 MG tablet Take 10 mg by mouth daily.    Historical Provider, MD  aspirin EC 81 MG tablet Take 81 mg by mouth daily.    Historical Provider, MD  b complex-vitamin c-folic acid (NEPHRO-VITE) 0.8 MG TABS Take 0.8 mg by mouth daily.    Historical Provider, MD  calcium carbonate (TUMS - DOSED IN MG ELEMENTAL CALCIUM) 500 MG chewable tablet Chew 2 tablets by mouth 3 (three) times daily with meals.    Historical Provider, MD  fexofenadine (ALLEGRA) 180 MG tablet Take 180 mg by mouth daily.    Historical Provider, MD  insulin NPH-insulin regular (NOVOLIN 70/30) (70-30) 100 UNIT/ML injection Inject 40 Units into the skin daily with breakfast.     Historical Provider, MD  losartan (COZAAR) 100 MG tablet Once daily 06/28/14   Historical Provider, MD  omeprazole (PRILOSEC) 20 MG capsule Take 20 mg by mouth daily.    Historical Provider, MD  polyethylene glycol (MIRALAX / GLYCOLAX) packet Take 17 g by mouth daily. Does not take on days of dialysis.    Historical Provider, MD  PRESCRIPTION MEDICATION Pt currently on IV ABX for cellulitis    Historical Provider, MD  RENVELA 800 MG tablet 2 each meal 05/07/14   Historical Provider, MD  sodium chloride (OCEAN) 0.65 % SOLN nasal spray Place 1 spray into the nose daily as needed for congestion.    Historical Provider, MD   BP 197/65 mmHg  Pulse 74  Temp(Src) 98.7 F (37.1 C) (Oral)  Resp 16  SpO2  94% Physical Exam  Constitutional: He is oriented to person, place, and time. He appears well-developed and well-nourished.  HENT:  Head: Normocephalic and atraumatic.  Right Ear: External ear normal.  Left Ear: External ear normal.  Nose: Nose normal.  Eyes: EOM are normal. Pupils are equal, round, and reactive to light. Right eye exhibits no discharge. Left eye exhibits no discharge.  Neck: Neck supple.  Cardiovascular: Normal rate, regular rhythm, normal heart sounds and intact distal pulses.   Pulmonary/Chest: Effort normal. No accessory muscle usage. No tachypnea and no bradypnea. He has rales in the right lower field and the left  lower field.  Abdominal: Soft. There is no tenderness.  Musculoskeletal: He exhibits no edema.  Neurological: He is alert and oriented to person, place, and time.  CN 2-12 grossly intact. 5/5 strength in all 4 extremities.   Skin: Skin is warm and dry.  Nursing note and vitals reviewed.   ED Course  Procedures (including critical care time) Labs Review Labs Reviewed  BASIC METABOLIC PANEL - Abnormal; Notable for the following:    Potassium 5.5 (*)    Glucose, Bld 153 (*)    BUN 60 (*)    Creatinine, Ser 9.91 (*)    GFR calc non Af Amer 4 (*)    GFR calc Af Amer 5 (*)    Anion gap 16 (*)    All other components within normal limits  CBC WITH DIFFERENTIAL/PLATELET - Abnormal; Notable for the following:    RBC 3.50 (*)    Hemoglobin 10.9 (*)    HCT 33.7 (*)    RDW 16.5 (*)    Platelets 107 (*)    All other components within normal limits  CBG MONITORING, ED - Abnormal; Notable for the following:    Glucose-Capillary 157 (*)    All other components within normal limits  CBG MONITORING, ED - Abnormal; Notable for the following:    Glucose-Capillary 156 (*)    All other components within normal limits  TROPONIN I  HEMOGLOBIN A1C  POTASSIUM    Imaging Review Dg Chest 2 View  01/08/2015   CLINICAL DATA:  74 year old male with syncope and  malaise. Current history of dialysis. Initial encounter.  EXAM: CHEST  2 VIEW  COMPARISON:  10/03/2014 and earlier.  FINDINGS: Increased pulmonary vascular congestion. Mildly lower lung volumes with chronic elevation of the left hemidiaphragm. Chronic scarring or atelectasis at the left lung base. No pneumothorax. Stable cardiac size and mediastinal contours. No consolidation or pleural effusion. No acute osseous abnormality identified. Visualized tracheal air column is within normal limits. Right subclavian vascular stent re- identified.  IMPRESSION: Increased vascular congestion/mild interstitial edema.  No pleural effusion or other acute cardiopulmonary process identified. Chronic elevation of the left hemidiaphragm with adjacent scarring/atelectasis.   Electronically Signed   By: Genevie Ann M.D.   On: 01/08/2015 09:10   Ct Head Wo Contrast  01/08/2015   CLINICAL DATA:  Two syncope episodes this morning.  EXAM: CT HEAD WITHOUT CONTRAST  TECHNIQUE: Contiguous axial images were obtained from the base of the skull through the vertex without contrast.  COMPARISON:  None  FINDINGS: There is low density throughout the periventricular white matter suggesting chronic changes. There is mild cerebral atrophy. No evidence for acute hemorrhage, mass lesion, midline shift, hydrocephalus or large infarct. The right globe is dense with postoperative changes. Small amount of fluid in the right mastoid air cells. Mucosal thickening and possibly fluid in left sphenoid sinus. No acute bone abnormality.  IMPRESSION: No acute intracranial abnormality.  Mild atrophy and evidence for chronic small vessel ischemic changes.  Left sphenoid sinus disease.   Electronically Signed   By: Markus Daft M.D.   On: 01/08/2015 09:40     EKG Interpretation   Date/Time:  Monday Jan 08 2015 07:46:48 EDT Ventricular Rate:  75 PR Interval:  257 QRS Duration: 162 QT Interval:  499 QTC Calculation: 557 R Axis:   119 Text Interpretation:  Sinus  rhythm Prolonged PR interval RBBB and LPFB  Borderline ST depression, lateral leads Baseline wander in lead(s) I II  aVR inferior ST nonspecific  changes new compared to Feb 2015 Confirmed by  Regenia Skeeter  MD, Ahlam Piscitelli [phone]) on 01/08/2015 8:47:32 AM       EKG Interpretation  Date/Time:  Monday Jan 08 2015 11:39:18 EDT Ventricular Rate:  65 PR Interval:  94 QRS Duration: 133 QT Interval:  519 QTC Calculation: 540 R Axis:   138 Text Interpretation:  Third degree heart block Right bundle branch block Abnormal T, consider ischemia, lateral leads Baseline wander in lead(s) V2 Heart block new compared to earlier in the day Confirmed by Willo Yoon  MD, An Schnabel ([id number]) on 01/08/2015 12:40:50 PM        EKG Interpretation  Date/Time:  Monday Jan 08 2015 11:40:56 EDT Ventricular Rate:  72 PR Interval:  278 QRS Duration: 162 QT Interval:  459 QTC Calculation: 502 R Axis:   120 Text Interpretation:  Sinus rhythm Prolonged PR interval RBBB and LPFB heart block not present anymore Confirmed by Advay Volante  MD, Jermani Pund ([id number]) on 01/08/2015 12:41:43 PM       CRITICAL CARE Performed by: Sherwood Gambler T   Total critical care time: 45 minutes  Critical care time was exclusive of separately billable procedures and treating other patients.  Critical care was necessary to treat or prevent imminent or life-threatening deterioration.  Critical care was time spent personally by me on the following activities: development of treatment plan with patient and/or surrogate as well as nursing, discussions with consultants, evaluation of patient's response to treatment, examination of patient, obtaining history from patient or surrogate, ordering and performing treatments and interventions, ordering and review of laboratory studies, ordering and review of radiographic studies, pulse oximetry and re-evaluation of patient's condition.  MDM   Final diagnoses:  Complete heart block   Patient had an unremarkable workup until  she is about to be admitted to telemetry for syncope. Prior to admission I was called to the bedside because the patient was briefly asystolic. The patient was seen to be breaking down right before this. EKG after patient quickly regained pulses and consciousness after no intervention showed a new complete heart block. He has likely been going in and out of this to cause his syncope. Blood pressure remained stable but he did have another episode of this while cardiology was at the bedside. Card was consulted almost immediately after the diagnosis was made. The patient was currently waiting in the ER for his bed and for a spot in the electrophysiology lab, eventually was able to get up there without further asystole or other acute emergencies. There was the thought of a temporary pacemaker but cardiology prefers to do it up in their unit or in the Cath Lab. The patient was transported up to their ICU with dopamine. Patient in critical condition upon admission.    Sherwood Gambler, MD 01/08/15 [phone]

## 2015-01-08 NOTE — ED Notes (Signed)
Pt reports that he has not been feeling well since last night. Reports he had a syncopal episode and then had a seizure in the line while checking in. Pt A&Ox4 on arrival. Pt reports he is MWF dialysis patient.

## 2015-01-08 NOTE — ED Notes (Signed)
Pt transported to Cath Lab without incident, on Zoll and Monitor with Statistician and [facility].  Report given to Cath Lab RN.

## 2015-01-08 NOTE — ED Notes (Signed)
Contacted pharmacy regarding delay in Dopamine

## 2015-01-08 NOTE — H&P (Signed)
[facility] History and Physical                                                                                    Antonio Page, is a 74 y.o. male  MRN: [id number]   DOB - 04-21-1941  Admit Date - 01/08/2015  Outpatient Primary MD for the patient is Geoffery Lyons, MD  Referring MD: Dr. Odis Luster  With History of -  Past Medical History  Diagnosis Date  . Hypertension   . Sleep apnea     uses a cpap  . Diabetes mellitus   . GERD (gastroesophageal reflux disease)   . Arthritis   . Glaucoma   . Dialysis patient   . Blind right eye   . Chronic kidney disease     dialysis  . Kidney stones   . Coronary artery disease       Past Surgical History  Procedure Laterality Date  . Dg av dialysis  shunt access exist*l* or  2010    left upper arm-not using  . Dg av dialysis  shunt access exist*l* or  2012    right upper arm-using this one  . Shoulder arthroscopy      right  . Knee arthroscopy      left  . Tonsillectomy    . Eye surgery      multiple rt eye surgeries  . Coronary stent placement    . Trigger finger release  06/08/2012    Procedure: RELEASE TRIGGER FINGER/A-1 PULLEY;  Surgeon: Cammie Sickle., MD;  Location: [place];  Service: Orthopedics;  Laterality: Right;  . Dupuytren contracture release  06/08/2012    Procedure: DUPUYTREN CONTRACTURE RELEASE;  Surgeon: Cammie Sickle., MD;  Location: [place];  Service: Orthopedics;  Laterality: Right;  right ring fascia excision with A1 Pulley right ring  . Cystoscopy with retrograde pyelogram, ureteroscopy and stent placement  07/21/2012    Procedure: [place], URETEROSCOPY AND STENT PLACEMENT;  Surgeon: Malka So, MD;  Location: [facility];  Service: Urology;  Laterality: Right;  . Mass excision  07/26/2012    Procedure: MINOR EXCISION OF MASS;  Surgeon: Izora Gala, MD;  Location: [place];  Service: ENT;  Laterality:  Right;  Excision of a Right Ear Skin Cancer with Primary Closure  wound class per Dr. Constance Holster   . Tee without cardioversion N/A 03/31/2013    Procedure: TRANSESOPHAGEAL ECHOCARDIOGRAM (TEE);  Surgeon: Sanda Klein, MD;  Location: [facility] ENDOSCOPY;  Service: Cardiovascular;  Laterality: N/A;  . Revision of arteriovenous goretex graft Right 10/19/2013    Procedure: REVISION OF ARTERIOVENOUS GORETEX GRAFT - thrombectomy;  Surgeon: Rosetta Posner, MD;  Location: [place];  Service: Vascular;  Laterality: Right;    in for   Chief Complaint  Patient presents with  . Loss of Consciousness  . Seizures     HPI This is a 74 year old male patient with past medical history of hypertension, chronic kidney disease on dialysis, diabetes on insulin, CAD, obesity with sleep apnea, anemia of chronic kidney disease, chronic mild thrombocytopenia and pulmonary sarcoidosis. Patient presented to the ER  after expressing to syncopal episodes at home. Patient typically cares for his elderly mother one night a week and did so yesterday evening into this morning and states he did not sleep much. He was talking to his wife has not rolled back in his head and he loss consciousness for a few seconds. He did not have any bowel or bladder incontinence and he had no postictal phase and came back to normal mentation within a few seconds. He had a second episode that was preceded by feeling poorly and flushed. During the episode he had shaking of all 4 strength is 1 minute but again was not incontinent of bowel or bladder and immediately awaken to baseline mental status. In talking with the patient and his family he has had similar episode before after trip up to [place] around [place] time. He was a passenger in a car noting this was a long distance drive. He attributed to previous episode 2 being late on obtaining dialysis treatment because of traveling. Patient has issues with chronic shortness of breath and dyspnea but he states this is  worse. He also has chest tightness with breathing in. Is followed by pulmonologist and has recently been placed on antireflux medication in the event this is contributing to some of his respiratory symptoms. 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 He also reports that with Friday dialysis he had issues with appropriate flow to his graft and had to be stuck multiple times had issues with bleeding and his dialysis treatment was shortened by 1 hour. He has not had any prolonged chest pain with classical symptoms, he's had no chest pain on exertion, he's had no dark or bloody stools, had no fevers or chills or GI symptoms.   The ER he was afebrile but somewhat hypertensive from a systolic standpoint with a blood pressure 97/65, heart rate was steady at 74, room air saturations 94%. Because of subjective dyspnea patient was placed on nasal cannula oxygen. Patient's lab work is consistent with the patient he is due for dialysis today with potassium being 5.5, BUN 16 creatinine 9.91. Troponin was normal at 0.03, hemoglobin stable at 10.9 and platelets stable at 107,000. CT of the head showed no acute abnormality. X-ray revealed increased vascular congestion/mild interstitial edema. EKG reveals sinus rhythm with right bundle branch block lightly prolonged QTC of 502 ms.   Review of Systems   In addition to the HPI above,  No Fever-chills, myalgias or other constitutional symptoms No Headache, changes with Vision or hearing, new weakness, tingling, numbness in any extremity, No problems swallowing food or Liquids, indigestion/reflux No Cough or  palpitations, orthopnea  No Abdominal pain, N/V; no melena or hematochezia, no dark tarry stools, Bowel movements are regular, No dysuria, hematuria or flank pain No new skin rashes, lesions, masses or bruises, No new joints pains-aches No recent weight gain or loss No polyuria,  polydypsia or polyphagia,  *A full 10 point Review of Systems was done, except as stated above, all other Review of Systems were negative.  Social History History  Substance Use Topics  . Smoking status: Former Smoker    Types: Cigars    Quit date: 06/08/1995  . Smokeless tobacco: Not on file  . Alcohol Use: No    Resides at: Private residence  Lives with: Wife  Ambulatory status: Ambulatory without assistive devices prior to admission   Family History Family History  Problem  Relation Age of Onset  . Heart disease Father   . Kidney disease Father   . Hypertension Mother   . Cancer Brother   . Hypertension Brother      Prior to Admission medications   Medication Sig Start Date End Date Taking? Authorizing Provider  allopurinol (ZYLOPRIM) 300 MG tablet Take 150 mg by mouth daily.    Yes Historical Provider, MD  amLODipine (NORVASC) 10 MG tablet Take 10 mg by mouth daily.   Yes Historical Provider, MD  aspirin EC 81 MG tablet Take 81 mg by mouth daily.   Yes Historical Provider, MD  b complex-vitamin c-folic acid (NEPHRO-VITE) 0.8 MG TABS Take 0.8 mg by mouth daily.   Yes Historical Provider, MD  fexofenadine (ALLEGRA) 180 MG tablet Take 180 mg by mouth daily.   Yes Historical Provider, MD  insulin NPH-insulin regular (NOVOLIN 70/30) (70-30) 100 UNIT/ML injection Inject 40 Units into the skin daily with breakfast.    Yes Historical Provider, MD  losartan (COZAAR) 100 MG tablet Take 100 mg by mouth daily. Once daily 06/28/14  Yes Historical Provider, MD  omeprazole (PRILOSEC) 20 MG capsule Take 40 mg by mouth daily.    Yes Historical Provider, MD  polyethylene glycol (MIRALAX / GLYCOLAX) packet Take 17 g by mouth daily. Does not take on days of dialysis.   Yes Historical Provider, MD  RENVELA 800 MG tablet Take 1,600 mg by mouth 3 (three) times daily with meals. 2 each meal 05/07/14  Yes Historical Provider, MD  sodium chloride (OCEAN) 0.65 % SOLN nasal spray Place 1 spray into  the nose daily as needed for congestion.   Yes Historical Provider, MD  calcium carbonate (TUMS - DOSED IN MG ELEMENTAL CALCIUM) 500 MG chewable tablet Chew 2 tablets by mouth 3 (three) times daily with meals.    Historical Provider, MD    No Known Allergies  Physical Exam  Vitals  Blood pressure 193/58, pulse 63, temperature 98.7 F (37.1 C), temperature source Oral, resp. rate 13, SpO2 99 %.   General:  In no acute distress, appears chronically ill and appropriate to stated age  Psych:  Normal affect, Denies Suicidal or Homicidal ideations, Awake Alert, Oriented X 3. Speech and thought patterns are clear and appropriate, no apparent short term memory deficits  Neuro:   No focal neurological deficits, CN II through XII intact, Strength 5/5 all 4 extremities, Sensation intact all 4 extremities.  ENT:  Ears and Eyes appear Normal except for very large cataract right eye causing blindness, Conjunctivae clear, PER. Moist oral mucosa without erythema or exudates.  Neck:  Supple, No lymphadenopathy appreciated  Respiratory:  Symmetrical chest wall movement, Good air movement bilaterally, bilateral fine inspiratory crackles throughout all lung fields. 2 L  Cardiac:  RRR, No Murmurs, chronic appearing bilateral LE edema noted-feet and ankles more puffy at 2+ legs 1+, no JVD, No carotid bruits, peripheral pulses palpable at 2+  Abdomen:  Positive bowel sounds, Soft, Non tender, Non distended,  No masses appreciated, no obvious hepatosplenomegaly  Skin:  No Cyanosis, Normal Skin Turgor, No Skin Rash or Bruise. Her extremity skin changes consistent with hemosiderin stasis dermatitis  Extremities: Symmetrical without obvious trauma or injury,  no effusions.  Data Review  CBC  Recent Labs Lab 01/08/15 0807  WBC 6.3  HGB 10.9*  HCT 33.7*  PLT 107*  MCV 96.3  MCH 31.1  MCHC 32.3  RDW 16.5*  LYMPHSABS 1.1  MONOABS 0.4  EOSABS 0.3  BASOSABS 0.0  Chemistries   Recent  Labs Lab 01/08/15 0807  NA 143  K 5.5*  CL 101  CO2 26  GLUCOSE 153*  BUN 60*  CREATININE 9.91*  CALCIUM 8.9    CrCl cannot be calculated (Unknown ideal weight.).  No results for input(s): TSH, T4TOTAL, T3FREE, THYROIDAB in the last 72 hours.  Invalid input(s): FREET3  Coagulation profile No results for input(s): INR, PROTIME in the last 168 hours.  No results for input(s): DDIMER in the last 72 hours.  Cardiac Enzymes  Recent Labs Lab 01/08/15 0807  TROPONINI 0.03    Invalid input(s): POCBNP  Urinalysis    Component Value Date/Time   COLORURINE YELLOW 12/28/2012 1905   APPEARANCEUR CLOUDY* 12/28/2012 1905   LABSPEC 1.017 12/28/2012 1905   PHURINE 7.0 12/28/2012 1905   GLUCOSEU 100* 12/28/2012 1905   HGBUR MODERATE* 12/28/2012 1905   BILIRUBINUR NEGATIVE 12/28/2012 1905   KETONESUR NEGATIVE 12/28/2012 1905   PROTEINUR >300* 12/28/2012 1905   UROBILINOGEN 0.2 12/28/2012 1905   NITRITE NEGATIVE 12/28/2012 1905   LEUKOCYTESUR NEGATIVE 12/28/2012 1905    Imaging results:   Dg Chest 2 View  01/08/2015   CLINICAL DATA:  74 year old male with syncope and malaise. Current history of dialysis. Initial encounter.  EXAM: CHEST  2 VIEW  COMPARISON:  10/03/2014 and earlier.  FINDINGS: Increased pulmonary vascular congestion. Mildly lower lung volumes with chronic elevation of the left hemidiaphragm. Chronic scarring or atelectasis at the left lung base. No pneumothorax. Stable cardiac size and mediastinal contours. No consolidation or pleural effusion. No acute osseous abnormality identified. Visualized tracheal air column is within normal limits. Right subclavian vascular stent re- identified.  IMPRESSION: Increased vascular congestion/mild interstitial edema.  No pleural effusion or other acute cardiopulmonary process identified. Chronic elevation of the left hemidiaphragm with adjacent scarring/atelectasis.   Electronically Signed   By: Genevie Ann M.D.   On: 01/08/2015 09:10    Ct Head Wo Contrast  01/08/2015   CLINICAL DATA:  Two syncope episodes this morning.  EXAM: CT HEAD WITHOUT CONTRAST  TECHNIQUE: Contiguous axial images were obtained from the base of the skull through the vertex without contrast.  COMPARISON:  None  FINDINGS: There is low density throughout the periventricular white matter suggesting chronic changes. There is mild cerebral atrophy. No evidence for acute hemorrhage, mass lesion, midline shift, hydrocephalus or large infarct. The right globe is dense with postoperative changes. Small amount of fluid in the right mastoid air cells. Mucosal thickening and possibly fluid in left sphenoid sinus. No acute bone abnormality.  IMPRESSION: No acute intracranial abnormality.  Mild atrophy and evidence for chronic small vessel ischemic changes.  Left sphenoid sinus disease.   Electronically Signed   By: Markus Daft M.D.   On: 01/08/2015 09:40     EKG: (Independently reviewed) sinus rhythm with right bundle-branch block and slightly increased QTC 503 ms   Assessment & Plan  Principal Problem:   Syncope -Admit to telemetry -Check 2-D echocardiogram -Etiology uncertain but with symptoms beginning after long vehicle trip to [place] state and associated acute on chronic lower extremity edema need to rule out pulmonary embolism so will check stat CT angiogram of the chest -Symptoms not consistent with seizure activity based on current history obtained -CT head negative for acute process; if above studies unrevealing may need to pursue MRI brain -Patient quite hypertensive so doubt orthostasis; not coughing prior to episode so doubt cough syncope  Active Problems:   Uncontrolled hypertension -Continue home medications -Pursue  dialysis as soon as possible; did not complete treatment time on Friday and this likely contributing to uncontrolled hypertension and associated volume overload    End stage renal disease on dialysis -Notify nephrology of  admission -We'll need dialysis today noting shortness of breath and abnormal chest x-ray findings    Diabetes mellitus type 2, insulin dependent -Continue home dose 70/30 insulin -Provide sliding scale insulin    Coronary artery disease -Did describe chest pain initially but later clarified as tightness in his chest with taking a deep breath; no typical chest pain symptoms such as prolonged chest pain with associated symptoms or chest pains with exertion so at this juncture will not cycle cardiac enzymes -If more typical chest pain occurs suggest proceeding with typical workup    Sarcoidosis of lung -Stable and typically not on oxygen at home -Primary pulmonologist is Dr. Gwenette Greet    Morbid obesity/OSA  -Uses CPAP compliant at home    Anemia in chronic kidney disease -Hemoglobin stable and at baseline    DVT Prophylaxis: Subcutaneous heparin-CT of the chest positive will need to upgrade to full dose heparin  Family Communication: Wife and son at bedside   Code Status:  Full code  Condition:  Stable  Discharge disposition: Anticipate discharge back to home with wife  Time spent in minutes : 60   Tyleah Loh L. ANP on 01/08/2015 at 11:38 AM  Between 7am to 7pm - Pager - [phone]  After 7pm go to [email or web address] - password [email or web address]  And look for the night coverage person covering me after hours  [facility]

## 2015-01-08 NOTE — ED Notes (Signed)
Patient had two episodes of loss of consciousness and Asystole.  PT was aroused with sternal rub both times. [facility] at bedside entire time. PT alert and oriented at this time. Pt's wife and sons at bedside.

## 2015-01-08 NOTE — ED Notes (Addendum)
Pulses regained and patient alert and responsive.  Zoll pads on patient.

## 2015-01-08 NOTE — Consult Note (Addendum)
Renal Service Consult Note [place] 01/08/2015 [place] Requesting Physician:  Dr Regenia Skeeter  Reason for Consult:  ESRD pt syncope x 3 and CHB in ED HPI: The patient is a 74 y.o. year-old with hx of HTN, DM, ESRD , blind R eye and CAD who presented to ED w syncope onset this last night, does not feel good. He was in the line at the ED waiting and had a seizure. He was admitted to ED then had an unresponsive episode w pulselessness and "asystole" on the monitor but this resolved and he woke up. Cardiology saw patient and his tracing showed P waves marching through so the diagnosis now is complete heart block and the plan is for placement of a temp pacemaker.  CXR showing L effusion and vasc congestion, early IS edema.    Patient denies any SOB, CP, fevers, chills, n/v/d, no abd pain, no jt pain or skin rash.  Past Medical History  Past Medical History  Diagnosis Date  . Hypertension   . Sleep apnea     uses a cpap  . Diabetes mellitus   . GERD (gastroesophageal reflux disease)   . Arthritis   . Glaucoma   . Dialysis patient   . Blind right eye   . Chronic kidney disease     dialysis  . Kidney stones   . Coronary artery disease    Past Surgical History  Past Surgical History  Procedure Laterality Date  . Dg av dialysis  shunt access exist*l* or  2010    left upper arm-not using  . Dg av dialysis  shunt access exist*l* or  2012    right upper arm-using this one  . Shoulder arthroscopy      right  . Knee arthroscopy      left  . Tonsillectomy    . Eye surgery      multiple rt eye surgeries  . Coronary stent placement    . Trigger finger release  06/08/2012    Procedure: RELEASE TRIGGER FINGER/A-1 PULLEY;  Surgeon: Cammie Sickle., MD;  Location: [place];  Service: Orthopedics;  Laterality: Right;  . Dupuytren contracture release  06/08/2012    Procedure: DUPUYTREN CONTRACTURE RELEASE;  Surgeon: Cammie Sickle., MD;  Location: [place];  Service: Orthopedics;  Laterality: Right;  right ring fascia excision with A1 Pulley right ring  . Cystoscopy with retrograde pyelogram, ureteroscopy and stent placement  07/21/2012    Procedure: [place], URETEROSCOPY AND STENT PLACEMENT;  Surgeon: Malka So, MD;  Location: [facility];  Service: Urology;  Laterality: Right;  . Mass excision  07/26/2012    Procedure: MINOR EXCISION OF MASS;  Surgeon: Izora Gala, MD;  Location: [place];  Service: ENT;  Laterality: Right;  Excision of a Right Ear Skin Cancer with Primary Closure  wound class per Dr. Constance Holster   . Tee without cardioversion N/A 03/31/2013    Procedure: TRANSESOPHAGEAL ECHOCARDIOGRAM (TEE);  Surgeon: Sanda Klein, MD;  Location: [facility] ENDOSCOPY;  Service: Cardiovascular;  Laterality: N/A;  . Revision of arteriovenous goretex graft Right 10/19/2013    Procedure: REVISION OF ARTERIOVENOUS GORETEX GRAFT - thrombectomy;  Surgeon: Rosetta Posner, MD;  Location: [facility];  Service: Vascular;  Laterality: Right;   Family History  Family History  Problem Relation Age of Onset  . Heart disease Father   . Kidney disease Father   . Hypertension Mother   .  Cancer Brother   . Hypertension Brother    Social History  reports that he quit smoking about 19 years ago. His smoking use included Cigars. He does not have any smokeless tobacco history on file. He reports that he does not drink alcohol or use illicit drugs. Allergies No Known Allergies Home medications Prior to Admission medications   Medication Sig Start Date End Date Taking? Authorizing Provider  allopurinol (ZYLOPRIM) 300 MG tablet Take 150 mg by mouth daily.    Yes Historical Provider, MD  amLODipine (NORVASC) 10 MG tablet Take 10 mg by mouth daily.   Yes Historical Provider, MD  aspirin EC 81 MG tablet Take 81 mg by mouth daily.   Yes Historical Provider, MD  b complex-vitamin c-folic acid  (NEPHRO-VITE) 0.8 MG TABS Take 0.8 mg by mouth daily.   Yes Historical Provider, MD  fexofenadine (ALLEGRA) 180 MG tablet Take 180 mg by mouth daily.   Yes Historical Provider, MD  insulin NPH-insulin regular (NOVOLIN 70/30) (70-30) 100 UNIT/ML injection Inject 40 Units into the skin daily with breakfast.    Yes Historical Provider, MD  losartan (COZAAR) 100 MG tablet Take 100 mg by mouth daily. Once daily 06/28/14  Yes Historical Provider, MD  omeprazole (PRILOSEC) 20 MG capsule Take 40 mg by mouth daily.    Yes Historical Provider, MD  polyethylene glycol (MIRALAX / GLYCOLAX) packet Take 17 g by mouth daily. Does not take on days of dialysis.   Yes Historical Provider, MD  RENVELA 800 MG tablet Take 1,600 mg by mouth 3 (three) times daily with meals. 2 each meal 05/07/14  Yes Historical Provider, MD  sodium chloride (OCEAN) 0.65 % SOLN nasal spray Place 1 spray into the nose daily as needed for congestion.   Yes Historical Provider, MD  calcium carbonate (TUMS - DOSED IN MG ELEMENTAL CALCIUM) 500 MG chewable tablet Chew 2 tablets by mouth 3 (three) times daily with meals.    Historical Provider, MD   Liver Function Tests No results for input(s): AST, ALT, ALKPHOS, BILITOT, PROT, ALBUMIN in the last 168 hours. No results for input(s): LIPASE, AMYLASE in the last 168 hours. CBC  Recent Labs Lab 01/08/15 0807  WBC 6.3  NEUTROABS 4.6  HGB 10.9*  HCT 33.7*  MCV 96.3  PLT 973*   Basic Metabolic Panel  Recent Labs Lab 01/08/15 0807  NA 143  K 5.5*  CL 101  CO2 26  GLUCOSE 153*  BUN 60*  CREATININE 9.91*  CALCIUM 8.9    Filed Vitals:   01/08/15 1200 01/08/15 1210 01/08/15 1215 01/08/15 1225  BP:   198/63 194/55  Pulse: 69 71 72 72  Temp:      TempSrc:      Resp: 14 14 17 16   SpO2: 97% 97% 97% 96%   Exam Elderly obese WM, no distress, calm No rash, cyanosis or gangrene Sclera anicteric, throat clear No jvd Chest clear bilat RRR no MRG, pacer pads in place ant chest Abd  obese, ntnd, +BS, no mass Trace pretib edema Neuro is alert and Ox 3  HD: [facility] [place] 4h 60min  126kg  2/2 bath  Heparin 4000  Mircera 100/ 2 wks last 4-20    Calcitriol 0.5 tiw w hd   Assessment: 1. Syncope / complete heart block - not on any negative chronotropic drugs. Cardiology evaluating 2. ESRD on HD MWF 3. Abnormal CXR - early edema , not symptomatic 4. Hyperkalemia - mild, K 5.4 5. HTN on ARB/ CCB  6. 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 Gout   Plan- HD later today or tomorrow when cardiac status stabilized.   Kelly Splinter MD (pgr) [phone]    ([phone] 01/08/2015, 12:33 PM

## 2015-01-08 NOTE — CV Procedure (Signed)
Procedure report  Procedure performed: EMERGENT PROCEDURE 1. Implantation of new temporary transvenous single chamber pacemaker 2. Fluoroscopy  Reason for procedure:  Symptomatic bradycardia due to:  Sinus arrest Complete heart block   Procedure performed by: Sanda Klein, MD  Complications: None  Estimated blood loss: <10 mL  Medications administered during procedure: Lidocaine 1% 30 mL locally,    Procedure details:  Emergent procedure due to unstable hemodynamic status.   The patient was prepped and draped in usual sterile fashion. Local anesthesia with 1% lidocaine was administered to to the right groin area. A 75F sheath was introduced via the modified Seldinger technique in the right common femoral vein.  Under fluoroscopic guidance and, a balloon tipped temporary transvenous pacemaker wire was advanced to the RV apex and position was confirmed with biplane fluoroscopy. Pacing threshold confirmed at 0.8 mV. Pacemaker wire/sheath were dressed in sterile fashion. Backup pacing at 50 bpm. Normal hemodynamics at end of procedure with native sinus rhythm and narrow complex QRS at 68 bpm, occasional PVCs.  No immediate complications.  Sanda Klein, MD, [facility] [phone] office [phone] pager

## 2015-01-08 NOTE — ED Notes (Signed)
Pulses regained 

## 2015-01-09 DIAGNOSIS — G4733 Obstructive sleep apnea (adult) (pediatric): Secondary | ICD-10-CM

## 2015-01-09 DIAGNOSIS — I459 Conduction disorder, unspecified: Secondary | ICD-10-CM

## 2015-01-09 LAB — GLUCOSE, CAPILLARY
GLUCOSE-CAPILLARY: 52 mg/dL — AB (ref 70–99)
GLUCOSE-CAPILLARY: 73 mg/dL (ref 70–99)
Glucose-Capillary: 109 mg/dL — ABNORMAL HIGH (ref 70–99)
Glucose-Capillary: 123 mg/dL — ABNORMAL HIGH (ref 70–99)
Glucose-Capillary: 56 mg/dL — ABNORMAL LOW (ref 70–99)
Glucose-Capillary: 71 mg/dL (ref 70–99)
Glucose-Capillary: 94 mg/dL (ref 70–99)

## 2015-01-09 LAB — COMPREHENSIVE METABOLIC PANEL
ALT: 13 U/L — ABNORMAL LOW (ref 17–63)
ANION GAP: 12 (ref 5–15)
AST: 16 U/L (ref 15–41)
Albumin: 3.5 g/dL (ref 3.5–5.0)
Alkaline Phosphatase: 105 U/L (ref 38–126)
BILIRUBIN TOTAL: 0.8 mg/dL (ref 0.3–1.2)
BUN: 35 mg/dL — AB (ref 6–20)
CO2: 30 mmol/L (ref 22–32)
Calcium: 9.5 mg/dL (ref 8.9–10.3)
Chloride: 101 mmol/L (ref 101–111)
Creatinine, Ser: 7.36 mg/dL — ABNORMAL HIGH (ref 0.61–1.24)
GFR calc Af Amer: 7 mL/min — ABNORMAL LOW (ref >60)
GFR, EST NON AFRICAN AMERICAN: 6 mL/min — AB (ref >60)
Glucose, Bld: 99 mg/dL (ref 70–99)
Potassium: 4.7 mmol/L (ref 3.5–5.1)
Sodium: 143 mmol/L (ref 135–145)
Total Protein: 6.1 g/dL — ABNORMAL LOW (ref 6.5–8.1)

## 2015-01-09 LAB — CBC
HCT: 33.7 % — ABNORMAL LOW (ref 39.0–52.0)
HCT: 33.2 % — ABNORMAL LOW (ref 39.0–52.0)
HEMOGLOBIN: 10.6 g/dL — AB (ref 13.0–17.0)
Hemoglobin: 10.7 g/dL — ABNORMAL LOW (ref 13.0–17.0)
MCH: 31.5 pg (ref 26.0–34.0)
MCH: 31.3 pg (ref 26.0–34.0)
MCHC: 31.8 g/dL (ref 30.0–36.0)
MCHC: 31.9 g/dL (ref 30.0–36.0)
MCV: 99.1 fL (ref 78.0–100.0)
MCV: 97.9 fL (ref 78.0–100.0)
Platelets: 90 10*3/uL — ABNORMAL LOW (ref 150–400)
Platelets: 103 10*3/uL — ABNORMAL LOW (ref 150–400)
RBC: 3.4 MIL/uL — AB (ref 4.22–5.81)
RBC: 3.39 MIL/uL — AB (ref 4.22–5.81)
RDW: 16.5 % — ABNORMAL HIGH (ref 11.5–15.5)
RDW: 16.1 % — ABNORMAL HIGH (ref 11.5–15.5)
WBC: 7.7 10*3/uL (ref 4.0–10.5)
WBC: 9.7 10*3/uL (ref 4.0–10.5)

## 2015-01-09 LAB — HEMOGLOBIN A1C
HEMOGLOBIN A1C: 6 % — AB (ref 4.8–5.6)
Mean Plasma Glucose: 126 mg/dL

## 2015-01-09 MED ORDER — NEPRO/CARBSTEADY PO LIQD: 237.0000 mL | ORAL | Status: DC | PRN

## 2015-01-09 MED ORDER — ATROPINE SULFATE 0.1 MG/ML IJ SOLN
INTRAMUSCULAR | Status: AC
  Filled 2015-01-09: qty 10

## 2015-01-09 MED ORDER — INSULIN ASPART PROT & ASPART (70-30 MIX) 100 UNIT/ML ~~LOC~~ SUSP
30.0000 [IU] | Freq: Every day | SUBCUTANEOUS | Status: DC
  Administered 2015-01-11 – 2015-01-12 (×2): 30 [IU] via SUBCUTANEOUS

## 2015-01-09 MED ORDER — SODIUM CHLORIDE 0.9 % IV SOLN: 100.0000 mL | INTRAVENOUS | Status: DC | PRN

## 2015-01-09 MED ORDER — LIDOCAINE-PRILOCAINE 2.5-2.5 % EX CREA: 1.0000 "application " | TOPICAL_CREAM | CUTANEOUS | Status: DC | PRN

## 2015-01-09 MED ORDER — LIDOCAINE HCL (PF) 1 % IJ SOLN: 5.0000 mL | INTRAMUSCULAR | Status: DC | PRN

## 2015-01-09 MED ORDER — HEPARIN SODIUM (PORCINE) 1000 UNIT/ML DIALYSIS: 1000.0000 [IU] | INTRAMUSCULAR | Status: DC | PRN

## 2015-01-09 MED ORDER — ALTEPLASE 2 MG IJ SOLR: 2.0000 mg | Freq: Once | INTRAMUSCULAR | Status: DC | PRN

## 2015-01-09 MED ORDER — HEPARIN SODIUM (PORCINE) 1000 UNIT/ML DIALYSIS
2000.0000 [IU] | INTRAMUSCULAR | Status: DC | PRN
  Filled 2015-01-09: qty 2

## 2015-01-09 MED ORDER — PENTAFLUOROPROP-TETRAFLUOROETH EX AERO: 1.0000 "application " | INHALATION_SPRAY | CUTANEOUS | Status: DC | PRN

## 2015-01-09 NOTE — Progress Notes (Signed)
Venous sheath was pulled from R femoral vein at 1050. Manual pressure was held for ~47mins. VS remained stable. Distal pulses 1-2+ throughout procedure. Site is a level 0. Gauze pressure dressing placed over site. Post sheath removal education done with teach back.

## 2015-01-09 NOTE — Progress Notes (Signed)
[facility] Progress Note   Subjective: 3.5kg off w HD yest, BP's up, and no further CHB overnight. K 4.7 today. Pt feeling better, SOB resolved.  Filed Vitals:   01/09/15 0700 01/09/15 0800 01/09/15 0900 01/09/15 1000  BP: 155/48 139/113 160/34 175/45  Pulse: 52 64 63 64  Temp:  99.4 F (37.4 C)    TempSrc:  Oral    Resp: 13 17 16 14   Height:      Weight:      SpO2: 92% 95% 97% 96%   Exam: Alert, lying flat, no distress No jvd Chest clear bilat RRR no MRG Abd obese, ntnd, +BS, no mass Trace pretib edema Neuro is alert and Ox 3  HD: [facility]WF [facility] 4h 80min 126kg 2/2 bath Heparin 4000  Mircera 100/ 2 wks last 4-20 Calcitriol 0.5 tiw w hd   Assessment: 1. Syncope / complete heart block - temp pacer removed, observing for now 2. ESRD on HD MWF 3. Pulm edema - improved clinically after HD 4. Hyperkalemia - resolved 5. HTN on norvasc, ARB on hold. BP's up 6. 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 Gout   Plan - HD in am, cont to lower volume, lower dry wt    Kelly Splinter MD  pager [phone]    cell [phone]  01/09/2015, 11:09 AM     Recent Labs Lab 01/08/15 0807 01/08/15 1242 01/08/15 1330 01/09/15 0325  NA 143  --  141 143  K 5.5* 6.0* 6.2* 4.7  CL 101  --  105 101  CO2 26  --   --  30  GLUCOSE 153*  --  175* 99  BUN 60*  --  65* 35*  CREATININE 9.91*  --  9.40* 7.36*  CALCIUM 8.9  --   --  9.5    Recent Labs Lab 01/09/15 0325  AST 16  ALT 13*  ALKPHOS 105  BILITOT 0.8  PROT 6.1*  ALBUMIN 3.5    Recent Labs Lab 01/08/15 0807 01/08/15 1330 01/09/15 0325  WBC 6.3  --  7.7  NEUTROABS 4.6  --   --   HGB 10.9* 12.6* 10.7*  HCT 33.7* 37.0* 33.7*  MCV 96.3  --  99.1  PLT 107*  --  90*   . allopurinol  150 mg Oral Daily  . amLODipine  10 mg Oral Daily  . aspirin EC  81 mg Oral Daily  . calcium carbonate  2 tablet Oral TID WC  .  ceFAZolin (ANCEF) IV  3 g Intravenous To Cath  . gentamicin irrigation  80 mg Irrigation To  Cath  . heparin  5,000 Units Subcutaneous 3 times per day  . insulin aspart  0-5 Units Subcutaneous QHS  . insulin aspart  0-9 Units Subcutaneous TID WC  . insulin aspart protamine- aspart  40 Units Subcutaneous Q breakfast  . loratadine  10 mg Oral Daily  . multivitamin  1 tablet Oral QHS  . pantoprazole  40 mg Oral Daily  . polyethylene glycol  17 g Oral Once per day on Sun Tue Thu Sat  . sevelamer carbonate  1,600 mg Oral TID WC  . sodium chloride  3 mL Intravenous Q12H  . sodium chloride  3 mL Intravenous Q12H   . sodium chloride 10 mL/hr at 01/08/15 1500  . DOPamine Stopped (01/08/15 1500)   sodium chloride, sodium chloride, sodium chloride, acetaminophen, ALPRAZolam, feeding supplement (NEPRO CARB STEADY), heparin, hydrALAZINE, lidocaine (PF), lidocaine-prilocaine, nitroGLYCERIN, ondansetron **OR** ondansetron (ZOFRAN) IV,  pentafluoroprop-tetrafluoroeth, sodium chloride, sodium chloride, sodium chloride, zolpidem

## 2015-01-09 NOTE — Progress Notes (Signed)
Patient Name: Antonio Page Date of Encounter: 01/09/2015     Principal Problem:   Syncope Active Problems:   Morbid obesity   OSA (obstructive sleep apnea)   Diabetes mellitus type 2, insulin dependent   Uncontrolled hypertension   Coronary artery disease   End stage renal disease on dialysis   Sarcoidosis of lung   Anemia in chronic kidney disease   Complete heart block    SUBJECTIVE  No complaints  CURRENT MEDS . allopurinol  150 mg Oral Daily  . amLODipine  10 mg Oral Daily  . aspirin EC  81 mg Oral Daily  . calcium carbonate  2 tablet Oral TID WC  .  ceFAZolin (ANCEF) IV  3 g Intravenous To Cath  . gentamicin irrigation  80 mg Irrigation To Cath  . heparin  5,000 Units Subcutaneous 3 times per day  . insulin aspart  0-5 Units Subcutaneous QHS  . insulin aspart  0-9 Units Subcutaneous TID WC  . insulin aspart protamine- aspart  40 Units Subcutaneous Q breakfast  . loratadine  10 mg Oral Daily  . multivitamin  1 tablet Oral QHS  . pantoprazole  40 mg Oral Daily  . polyethylene glycol  17 g Oral Once per day on Sun Tue Thu Sat  . sevelamer carbonate  1,600 mg Oral TID WC  . sodium chloride  3 mL Intravenous Q12H  . sodium chloride  3 mL Intravenous Q12H    OBJECTIVE  Filed Vitals:   01/09/15 0400 01/09/15 0500 01/09/15 0600 01/09/15 0700  BP: 179/46 169/44 155/53 155/48  Pulse: 60 58 62 52  Temp: 98.8 F (37.1 C)     TempSrc: Oral     Resp: 16 16 16 13   Height:      Weight:  276 lb 7.3 oz (125.4 kg)    SpO2: 95% 94% 88% 92%    Intake/Output Summary (Last 24 hours) at 01/09/15 0755 Last data filed at 01/09/15 0700  Gross per 24 hour  Intake    210 ml  Output   3459 ml  Net  -3249 ml   Filed Weights   01/08/15 1727 01/08/15 2057 01/09/15 0500  Weight: 282 lb 13.6 oz (128.3 kg) 278 lb 7.1 oz (126.3 kg) 276 lb 7.3 oz (125.4 kg)    PHYSICAL EXAM  General: Weak appearing. 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 no JVD. Carotids 2+ bilat; no bruits. No lymphadenopathy or thryomegaly appreciated. Cor: PMI nondisplaced. Regular rate & rhythm. No rubs, gallops or murmurs. Lungs: rales bases Abdomen: soft, nontender, nondistended. No hepatosplenomegaly. No bruits or masses. Good bowel sounds. Extremities: no cyanosis, clubbing, rash, trace edema; HD access RUE with palpable thrill Neuro: alert & oriented x 3, cranial nerves grossly intact. moves all 4 extremities w/o difficulty. Affect pleasant.  Accessory Clinical Findings  CBC  Recent Labs  01/08/15 0807 01/08/15 1330 01/09/15 0325  WBC 6.3  --  7.7  NEUTROABS 4.6  --   --   HGB 10.9* 12.6* 10.7*  HCT 33.7* 37.0* 33.7*  MCV 96.3  --  99.1  PLT 107*  --  90*   Basic Metabolic Panel  Recent Labs  01/08/15 0807  01/08/15 1330 01/08/15 1730 01/09/15 0325  NA 143  --  141  --  143  K 5.5*  < > 6.2*  --  4.7  CL 101  --  105  --  101  CO2 26  --   --   --  30  GLUCOSE 153*  --  175*  --  99  BUN 60*  --  65*  --  35*  CREATININE 9.91*  --  9.40*  --  7.36*  CALCIUM 8.9  --   --   --  9.5  MG  --   --   --  2.0  --   < > = values in this interval not displayed. Liver Function Tests  Recent Labs  01/09/15 0325  AST 16  ALT 13*  ALKPHOS 105  BILITOT 0.8  PROT 6.1*  ALBUMIN 3.5   No results for input(s): LIPASE, AMYLASE in the last 72 hours. Cardiac Enzymes  Recent Labs  01/08/15 0807  TROPONINI 0.03   BNP Invalid input(s): POCBNP D-Dimer No results for input(s): DDIMER in the last 72 hours. Hemoglobin A1C  Recent Labs  01/08/15 1242  HGBA1C 6.0*   Fasting Lipid Panel No results for input(s): CHOL, HDL, LDLCALC, TRIG, CHOLHDL, LDLDIRECT in the last 72 hours. Thyroid Function Tests  Recent Labs  01/08/15 1730  TSH 3.422    TELE  NSR HR in 60s. He did have two paced beats after a PAC and sinus pause and some undersensing beats that did not capture.  Radiology/Studies  Dg Chest 2  View  01/08/2015   CLINICAL DATA:  74 year old male with syncope and malaise. Current history of dialysis. Initial encounter.  EXAM: CHEST  2 VIEW  COMPARISON:  10/03/2014 and earlier.  FINDINGS: Increased pulmonary vascular congestion. Mildly lower lung volumes with chronic elevation of the left hemidiaphragm. Chronic scarring or atelectasis at the left lung base. No pneumothorax. Stable cardiac size and mediastinal contours. No consolidation or pleural effusion. No acute osseous abnormality identified. Visualized tracheal air column is within normal limits. Right subclavian vascular stent re- identified.  IMPRESSION: Increased vascular congestion/mild interstitial edema.  No pleural effusion or other acute cardiopulmonary process identified. Chronic elevation of the left hemidiaphragm with adjacent scarring/atelectasis.   Electronically Signed   By: Genevie Ann M.D.   On: 01/08/2015 09:10   Ct Head Wo Contrast  01/08/2015   CLINICAL DATA:  Two syncope episodes this morning.  EXAM: CT HEAD WITHOUT CONTRAST  TECHNIQUE: Contiguous axial images were obtained from the base of the skull through the vertex without contrast.  COMPARISON:  None  FINDINGS: There is low density throughout the periventricular white matter suggesting chronic changes. There is mild cerebral atrophy. No evidence for acute hemorrhage, mass lesion, midline shift, hydrocephalus or large infarct. The right globe is dense with postoperative changes. Small amount of fluid in the right mastoid air cells. Mucosal thickening and possibly fluid in left sphenoid sinus. No acute bone abnormality.  IMPRESSION: No acute intracranial abnormality.  Mild atrophy and evidence for chronic small vessel ischemic changes.  Left sphenoid sinus disease.   Electronically Signed   By: Markus Daft M.D.   On: 01/08/2015 09:40   Ct Angio Chest Pe W/cm &/or Wo Cm  01/08/2015   CLINICAL DATA:  Dyspnea.  Dialysis today.  EXAM: CT ANGIOGRAPHY CHEST WITH CONTRAST  TECHNIQUE:  Multidetector CT imaging of the chest was performed using the standard protocol during bolus administration of intravenous contrast. Multiplanar CT image reconstructions and MIPs were obtained to evaluate the vascular anatomy.  CONTRAST:  160mL OMNIPAQUE IOHEXOL 350 MG/ML SOLN  COMPARISON:  Radiographs 01/08/2015  FINDINGS: Cardiovascular: There is good opacification of the pulmonary arteries. There is no pulmonary embolism. The thoracic aorta is normal in caliber and intact. There  is extensive calcified coronary artery plaque.  Lungs: There is interlobular septal thickening consistent with interstitial fluid. There are ground-glass opacities which likely represent alveolar edema. The findings are most suggestive of congestive heart failure. Mild atelectatic-appearing airspace opacity is present in the dependent posterior bases bilaterally.  Central airways: Patent  Effusions: Very small effusions bilaterally.  Lymphadenopathy: There are a few nonspecific nodes in the mediastinum. No pathologic adenopathy is evident.  Esophagus: Unremarkable  Upper abdomen: A transvenous lead extends up the IVC and into the right ventricle, presumably a temporary pacer.  Musculoskeletal: No acute musculoskeletal abnormalities.  Review of the MIP images confirms the above findings.  IMPRESSION: *Negative for pulmonary embolism *Interstitial and alveolar edema consistent with congestive heart failure. Very small pleural effusions bilaterally. *Extensive calcified coronary artery plaque.   Electronically Signed   By: Andreas Newport M.D.   On: 01/08/2015 22:55   Dg Chest Port 1 View  01/08/2015   CLINICAL DATA:  Temporary pacemaker placement.  EXAM: PORTABLE CHEST - 1 VIEW  COMPARISON:  01/08/2015 and 0900 hr  FINDINGS: Cardiopericardial silhouette remains enlarged. Interstitial pulmonary edema is present diffusely with mild alveolar pulmonary edema in the RIGHT midlung. Defibrillator pads overlie the chest. Since the prior exam,  the monitoring leads and defibrillator pads are new.  There is also a new inferior approach catheter which terminates in the region of the RIGHT ventricular apex, probably representing trans venous pacemaker.  Question LEFT pleural effusion.  IMPRESSION: 1. New catheter from inferior approach, presumably representing femoral approach transvenous pacemaker. The distal tip is in the region of the RIGHT ventricular apex. 2. Mild to moderate CHF with interstitial and mild alveolar pulmonary edema.   Electronically Signed   By: Dereck Ligas M.D.   On: 01/08/2015 16:01    ASSESSMENT AND PLAN  Antonio Page is a 74 y.o. male with a history of sarcoid, CAD w/ RCA stent '99, ESRD on HD M/W/F, nl LV, HTN, DM and OSA on CPAP who presented to the [facility] ED on 01/08/15 with multiple episodes of syncope. In the ED he was found to have intermittent 3rd deg heart block and hyperkalemia.   Symptomatic bradycardia- he underwent emergent temporary pacemaker placement on 01/08/15. He has not had any more bradycardia overnight. He did have two paced beats after a PAC and sinus pause and some undersensing beats that did not capture. The plan will be to remove the temporary PM and keep him for continued observation. If he continues to have no heart block we will discharge him home without permanent pacemaker placement.   Hyperkalemia- This is now resolved. K 4.7 today. Plan for HD tomorrow.    Dispo- will transfer to step down  Judy Pimple PA-C  Pager [phone]  EP Attending  Patient seen and examined. Agree with above exam, assessment and plan with minimal amendments. The patient has had no heart block overnight and his potassium is normal. Will plan to move to stepdown today, watch on tele and dialyze tomorrow. If no heart block, then will let him go home with close followup. I suspect he will eventually require PPM.  Mikle Bosworth.D.

## 2015-01-09 NOTE — Progress Notes (Signed)
Physician notified: Lovena Le At: 1448  Regarding: Pt c/o "head swimming". BP dropped from 155/40s to 107sys,30%. flat 133/30. States he that his speech is slurring, RN not notice.  Orders: Call Katie PA with cardiology as he is in office now.  Katie paged---  Spoke with Dr. Sloan Leiter in person at 1510 about symptoms--continue to monitor, this RN checked BG--56. Reported findings to Dr. Sloan Leiter, gave 15g carbs.  Hypoglycemic Event  CBG: 56  Treatment: 15 GM carbohydrate snack  Symptoms: Sweaty  Follow-up CBG: Time:1530 CBG Result:52--gave second round 15g carbs. Awaiting recheck at 1545  Possible Reasons for Event: Medication regimen: pt recieved 40units 70/30, low oral intake.   Comments/MD notified:Ghimire, see above.   1545: CBG 71, continue to monitor. Recheck PRN and ACHS   Domingo Sep  Remember to initiate Hypoglycemia Order Set & complete

## 2015-01-09 NOTE — Progress Notes (Addendum)
Medical Consultation Follow Progress Note             PATIENT DETAILS Name: Antonio Page Age: 74 y.o. Sex: male Date of Birth: May 06, 1941 Admit Date: 01/08/2015 Admitting Physician Evans Lance, MD ZOX:[id number] A, MD  Subjective: No SOB/Chest pain this am.   Assessment/Plan: Principal Problem:   Syncope:secondary to complete heart block. Defer to Primary service  Active Problems:   Complete heart block:?secondary to hyperkalemia. Underwent emergent temp pacer on 5/2. Per primary service    Hyperkalemia:resolved post HD. Follow lytes    ESRD: per renal    OSA (obstructive sleep apnea):continue CPAP qhs    Diabetes mellitus type 2, insulin dependent:CBG's stable, continue Insulin 70/30 and SSI.   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  Disposition: Remain inpatient  Antimicrobial agents  See below  Anti-infectives    Start     Dose/Rate Route Frequency Ordered Stop   01/08/15 1515  ceFAZolin (ANCEF) 3 g in dextrose 5 % 50 mL IVPB  Status:  Discontinued     3 g 160 mL/hr over 30 Minutes Intravenous To Cath Lab 01/08/15 1446 01/09/15 1205   01/08/15 1500  gentamicin (GARAMYCIN) 80 mg in sodium chloride irrigation 0.9 % 500 mL irrigation  Status:  Discontinued     80 mg Irrigation To Cath Lab 01/08/15 1446 01/09/15 1205      DVT Prophylaxis: Prophylactic Heparin  Code Status: Full code   Family Communication None at bedside  Time spent 35 minutes-which includes 50% of the time with face-to-face with patient/ family and coordinating care related to the above assessment and  plan.  MEDICATIONS: Scheduled Meds: . allopurinol  150 mg Oral Daily  . amLODipine  10 mg Oral Daily  . aspirin EC  81 mg Oral Daily  . calcium carbonate  2 tablet Oral TID WC  . heparin  5,000 Units Subcutaneous 3 times per day  . insulin aspart  0-5 Units Subcutaneous QHS  . insulin aspart  0-9 Units Subcutaneous TID WC  . insulin aspart protamine- aspart  40 Units Subcutaneous Q breakfast  . loratadine  10 mg Oral Daily  . multivitamin  1 tablet Oral QHS  . pantoprazole  40 mg Oral Daily  . polyethylene glycol  17 g Oral Once per day on Sun Tue Thu Sat  . sevelamer carbonate  1,600 mg Oral TID WC  . sodium chloride  3 mL Intravenous Q12H   Continuous Infusions: . DOPamine Stopped (01/08/15 1500)   PRN Meds:.sodium chloride, sodium chloride, sodium chloride, acetaminophen, ALPRAZolam, feeding supplement (NEPRO CARB STEADY), heparin, [START ON 01/10/2015] heparin, hydrALAZINE, lidocaine (PF), lidocaine-prilocaine, nitroGLYCERIN, ondansetron **OR** ondansetron (ZOFRAN) IV, pentafluoroprop-tetrafluoroeth, sodium chloride, sodium chloride, zolpidem    PHYSICAL EXAM: Vital signs in last 24 hours: Filed Vitals:   01/09/15 1154 01/09/15 1439 01/09/15 1442 01/09/15 1454  BP: 154/44 107/84 133/36 155/32  Pulse: 60 65 64 65  Temp:      TempSrc:      Resp: 13 19 15 16   Height:      Weight:      SpO2: 95% 96% 93% 98%    Weight change:  [employer]  01/08/15 1727 01/08/15 2057 01/09/15 0500  Weight: 128.3 kg (282 lb 13.6 oz) 126.3 kg (278 lb 7.1 oz) 125.4 kg (276 lb 7.3 oz)   Body mass index is 38.58 kg/(m^2).   Gen Exam: Awake and alert with clear speech.  Neck: Supple, No JVD.   Chest: B/L Clear.   CVS: S1 S2 Regular, no murmurs.  Abdomen: soft, BS +, non tender, non distended.  Extremities: no edema, lower extremities warm to touch. Neurologic: Non Focal.   Skin: No Rash.   Wounds: N/A.   Intake/Output from previous day:  Intake/Output Summary (Last 24 hours) at  01/09/15 1510 Last data filed at 01/09/15 1252  Gross per 24 hour  Intake    340 ml  Output   3684 ml  Net  -3344 ml     LAB RESULTS: CBC  Recent Labs Lab 01/08/15 0807 01/08/15 1330 01/09/15 0325 01/09/15 1425  WBC 6.3  --  7.7 9.7  HGB 10.9* 12.6* 10.7* 10.6*  HCT 33.7* 37.0* 33.7* 33.2*  PLT 107*  --  90* 103*  MCV 96.3  --  99.1 97.9  MCH 31.1  --  31.5 31.3  MCHC 32.3  --  31.8 31.9  RDW 16.5*  --  16.5* 16.1*  LYMPHSABS 1.1  --   --   --   MONOABS 0.4  --   --   --   EOSABS 0.3  --   --   --   BASOSABS 0.0  --   --   --     Chemistries   Recent Labs Lab 01/08/15 0807 01/08/15 1242 01/08/15 1330 01/08/15 1730 01/09/15 0325  NA 143  --  141  --  143  K 5.5* 6.0* 6.2*  --  4.7  CL 101  --  105  --  101  CO2 26  --   --   --  30  GLUCOSE 153*  --  175*  --  99  BUN 60*  --  65*  --  35*  CREATININE 9.91*  --  9.40*  --  7.36*  CALCIUM 8.9  --   --   --  9.5  MG  --   --   --  2.0  --     CBG:  Recent Labs Lab 01/08/15 1146 01/08/15 1641 01/08/15 2158 01/09/15 0750 01/09/15 1156  GLUCAP 156* 193* 115* 109* 123*    GFR Estimated Creatinine Clearance: 11.7 mL/min (by C-G formula based on Cr of 7.36).  Coagulation profile No results for input(s): INR, PROTIME in the last 168 hours.  Cardiac Enzymes  Recent Labs Lab 01/08/15 0807  TROPONINI 0.03    Invalid input(s): POCBNP No results for input(s): DDIMER in the last 72 hours.  Recent Labs  01/08/15 1242  HGBA1C 6.0*   No results for input(s): CHOL, HDL, LDLCALC, TRIG, CHOLHDL, LDLDIRECT in the last 72 hours.  Recent Labs  01/08/15 1730  TSH 3.422   No results for input(s): VITAMINB12, FOLATE, FERRITIN, TIBC, IRON, RETICCTPCT in the last 72 hours. No results for input(s): LIPASE, AMYLASE in the last 72 hours.  Urine Studies No results for input(s): UHGB, CRYS in the last 72 hours.  Invalid input(s): UACOL, UAPR, USPG, UPH, UTP, UGL, UKET, UBIL, UNIT, UROB, ULEU, UEPI,  UWBC, URBC, UBAC, CAST, UCOM, BILUA  MICROBIOLOGY: Recent Results (from the past 240 hour(s))  MRSA PCR Screening     Status: None   Collection Time: 01/08/15  2:40 PM  Result  Value Ref Range Status   MRSA by PCR NEGATIVE NEGATIVE Final    Comment:        The GeneXpert MRSA Assay (FDA approved for NASAL specimens only), is one component of a comprehensive MRSA colonization surveillance program. It is not intended to diagnose MRSA infection nor to guide or monitor treatment for MRSA infections.     RADIOLOGY STUDIES/RESULTS: Dg Chest 2 View  01/08/2015   CLINICAL DATA:  74 year old male with syncope and malaise. Current history of dialysis. Initial encounter.  EXAM: CHEST  2 VIEW  COMPARISON:  10/03/2014 and earlier.  FINDINGS: Increased pulmonary vascular congestion. Mildly lower lung volumes with chronic elevation of the left hemidiaphragm. Chronic scarring or atelectasis at the left lung base. No pneumothorax. Stable cardiac size and mediastinal contours. No consolidation or pleural effusion. No acute osseous abnormality identified. Visualized tracheal air column is within normal limits. Right subclavian vascular stent re- identified.  IMPRESSION: Increased vascular congestion/mild interstitial edema.  No pleural effusion or other acute cardiopulmonary process identified. Chronic elevation of the left hemidiaphragm with adjacent scarring/atelectasis.   Electronically Signed   By: Genevie Ann M.D.   On: 01/08/2015 09:10   Ct Head Wo Contrast  01/08/2015   CLINICAL DATA:  Two syncope episodes this morning.  EXAM: CT HEAD WITHOUT CONTRAST  TECHNIQUE: Contiguous axial images were obtained from the base of the skull through the vertex without contrast.  COMPARISON:  None  FINDINGS: There is low density throughout the periventricular white matter suggesting chronic changes. There is mild cerebral atrophy. No evidence for acute hemorrhage, mass lesion, midline shift, hydrocephalus or large infarct.  The right globe is dense with postoperative changes. Small amount of fluid in the right mastoid air cells. Mucosal thickening and possibly fluid in left sphenoid sinus. No acute bone abnormality.  IMPRESSION: No acute intracranial abnormality.  Mild atrophy and evidence for chronic small vessel ischemic changes.  Left sphenoid sinus disease.   Electronically Signed   By: Markus Daft M.D.   On: 01/08/2015 09:40   Ct Angio Chest Pe W/cm &/or Wo Cm  01/08/2015   CLINICAL DATA:  Dyspnea.  Dialysis today.  EXAM: CT ANGIOGRAPHY CHEST WITH CONTRAST  TECHNIQUE: Multidetector CT imaging of the chest was performed using the standard protocol during bolus administration of intravenous contrast. Multiplanar CT image reconstructions and MIPs were obtained to evaluate the vascular anatomy.  CONTRAST:  140mL OMNIPAQUE IOHEXOL 350 MG/ML SOLN  COMPARISON:  Radiographs 01/08/2015  FINDINGS: Cardiovascular: There is good opacification of the pulmonary arteries. There is no pulmonary embolism. The thoracic aorta is normal in caliber and intact. There is extensive calcified coronary artery plaque.  Lungs: There is interlobular septal thickening consistent with interstitial fluid. There are ground-glass opacities which likely represent alveolar edema. The findings are most suggestive of congestive heart failure. Mild atelectatic-appearing airspace opacity is present in the dependent posterior bases bilaterally.  Central airways: Patent  Effusions: Very small effusions bilaterally.  Lymphadenopathy: There are a few nonspecific nodes in the mediastinum. No pathologic adenopathy is evident.  Esophagus: Unremarkable  Upper abdomen: A transvenous lead extends up the IVC and into the right ventricle, presumably a temporary pacer.  Musculoskeletal: No acute musculoskeletal abnormalities.  Review of the MIP images confirms the above findings.  IMPRESSION: *Negative for pulmonary embolism *Interstitial and alveolar edema consistent with  congestive heart failure. Very small pleural effusions bilaterally. *Extensive calcified coronary artery plaque.   Electronically Signed   By: Valerie Roys.D.  On: 01/08/2015 22:55   Dg Chest Port 1 View  01/08/2015   CLINICAL DATA:  Temporary pacemaker placement.  EXAM: PORTABLE CHEST - 1 VIEW  COMPARISON:  01/08/2015 and 0900 hr  FINDINGS: Cardiopericardial silhouette remains enlarged. Interstitial pulmonary edema is present diffusely with mild alveolar pulmonary edema in the RIGHT midlung. Defibrillator pads overlie the chest. Since the prior exam, the monitoring leads and defibrillator pads are new.  There is also a new inferior approach catheter which terminates in the region of the RIGHT ventricular apex, probably representing trans venous pacemaker.  Question LEFT pleural effusion.  IMPRESSION: 1. New catheter from inferior approach, presumably representing femoral approach transvenous pacemaker. The distal tip is in the region of the RIGHT ventricular apex. 2. Mild to moderate CHF with interstitial and mild alveolar pulmonary edema.   Electronically Signed   By: Dereck Ligas M.D.   On: 01/08/2015 16:01    Oren Binet, MD  [facility] Pager:[phone]  If 7PM-7AM, please contact night-coverage [email or web address] Password [email or web address] 01/09/2015, 3:10 PM   LOS: 1 day

## 2015-01-10 ENCOUNTER — Encounter (HOSPITAL_COMMUNITY): Payer: Self-pay | Admitting: Cardiovascular Disease

## 2015-01-10 ENCOUNTER — Encounter (HOSPITAL_COMMUNITY)
Admission: EM | Disposition: A | Discharge status: Home / Self Care | Payer: Self-pay | Source: Home / Self Care | Attending: Internal Medicine

## 2015-01-10 ENCOUNTER — Encounter (HOSPITAL_COMMUNITY)
Admission: EM | Disposition: A | Discharge status: Home / Self Care | Payer: PRIVATE HEALTH INSURANCE | Source: Home / Self Care | Attending: Internal Medicine

## 2015-01-10 DIAGNOSIS — I442 Atrioventricular block, complete: Secondary | ICD-10-CM

## 2015-01-10 DIAGNOSIS — I10 Essential (primary) hypertension: Secondary | ICD-10-CM

## 2015-01-10 HISTORY — PX: EP IMPLANTABLE DEVICE: SHX172B

## 2015-01-10 LAB — BASIC METABOLIC PANEL
Anion gap: 13 (ref 5–15)
BUN: 33 mg/dL — ABNORMAL HIGH (ref 6–20)
CALCIUM: 8.9 mg/dL (ref 8.9–10.3)
CO2: 26 mmol/L (ref 22–32)
Chloride: 102 mmol/L (ref 101–111)
Creatinine, Ser: 6.9 mg/dL — ABNORMAL HIGH (ref 0.61–1.24)
GFR calc non Af Amer: 7 mL/min — ABNORMAL LOW (ref >60)
GFR, EST AFRICAN AMERICAN: 8 mL/min — AB (ref >60)
Glucose, Bld: 146 mg/dL — ABNORMAL HIGH (ref 70–99)
Potassium: 4.4 mmol/L (ref 3.5–5.1)
Sodium: 141 mmol/L (ref 135–145)

## 2015-01-10 LAB — GLUCOSE, CAPILLARY
GLUCOSE-CAPILLARY: 132 mg/dL — AB (ref 70–99)
GLUCOSE-CAPILLARY: 167 mg/dL — AB (ref 70–99)
Glucose-Capillary: 118 mg/dL — ABNORMAL HIGH (ref 70–99)
Glucose-Capillary: 124 mg/dL — ABNORMAL HIGH (ref 70–99)
Glucose-Capillary: 177 mg/dL — ABNORMAL HIGH (ref 70–99)

## 2015-01-10 SURGERY — ICD/BIV ICD GENERATOR REMOVAL
Anesthesia: LOCAL

## 2015-01-10 SURGERY — PACEMAKER IMPLANT
Anesthesia: LOCAL

## 2015-01-10 MED ORDER — DEXTROSE 5 % IV SOLN
3.0000 g | INTRAVENOUS | Status: AC
  Administered 2015-01-10: 3 g via INTRAVENOUS
  Filled 2015-01-10: qty 3000

## 2015-01-10 MED ORDER — ACETAMINOPHEN 325 MG PO TABS: 325.0000 mg | ORAL_TABLET | ORAL | Status: DC | PRN

## 2015-01-10 MED ORDER — CEFAZOLIN SODIUM 1-5 GM-% IV SOLN
1.0000 g | Freq: Four times a day (QID) | INTRAVENOUS | Status: AC
  Administered 2015-01-11 (×2): 1 g via INTRAVENOUS
  Filled 2015-01-10 (×2): qty 50

## 2015-01-10 MED ORDER — ALPRAZOLAM 0.5 MG PO TABS
ORAL_TABLET | ORAL | Status: AC
  Filled 2015-01-10: qty 1

## 2015-01-10 MED ORDER — HYDRALAZINE HCL 20 MG/ML IJ SOLN
INTRAMUSCULAR | Status: AC
  Filled 2015-01-10: qty 1

## 2015-01-10 MED ORDER — MIDAZOLAM HCL 5 MG/5ML IJ SOLN
INTRAMUSCULAR | Status: DC | PRN
  Administered 2015-01-10: 1 mg via INTRAVENOUS

## 2015-01-10 MED ORDER — MIDAZOLAM HCL 5 MG/5ML IJ SOLN
INTRAMUSCULAR | Status: AC
  Filled 2015-01-10: qty 5

## 2015-01-10 MED ORDER — HYDRALAZINE HCL 20 MG/ML IJ SOLN
INTRAMUSCULAR | Status: DC | PRN
  Administered 2015-01-10: 25 mg via INTRAVENOUS

## 2015-01-10 MED ORDER — LIDOCAINE HCL (PF) 1 % IJ SOLN
INTRAMUSCULAR | Status: AC
  Filled 2015-01-10: qty 60

## 2015-01-10 MED ORDER — SODIUM CHLORIDE 0.9 % IR SOLN
80.0000 mg | Status: DC
  Filled 2015-01-10: qty 2

## 2015-01-10 MED ORDER — HEPARIN (PORCINE) IN NACL 2-0.9 UNIT/ML-% IJ SOLN
INTRAMUSCULAR | Status: AC
  Filled 2015-01-10: qty 500

## 2015-01-10 MED ORDER — ONDANSETRON HCL 4 MG/2ML IJ SOLN: 4.0000 mg | Freq: Four times a day (QID) | INTRAMUSCULAR | Status: DC | PRN

## 2015-01-10 SURGICAL SUPPLY — 9 items
CABLE SURGICAL S-101-97-12 (CABLE) ×2 IMPLANT
HEMOSTAT SURGICEL 2X4 FIBR (HEMOSTASIS) ×2 IMPLANT
LEAD CAPSURE NOVUS 5076-52CM (Lead) ×2 IMPLANT
LEAD CAPSURE NOVUS 5076-58CM (Lead) ×2 IMPLANT
PACEMAKER ADAPTA DR ADDRL1 (Pacemaker) ×1 IMPLANT
PAD DEFIB LIFELINK (PAD) ×2 IMPLANT
PPM ADAPTA DR ADDRL1 (Pacemaker) ×2 IMPLANT
SHEATH CLASSIC 7F (SHEATH) ×4 IMPLANT
TRAY PACEMAKER INSERTION (CUSTOM PROCEDURE TRAY) ×2 IMPLANT

## 2015-01-10 NOTE — Progress Notes (Signed)
Pt. Became diaphoretic and sob. [place] at 4L, CBG 132. VSS. MD present and aware. Lowered uf goal and shortened HD tx time.

## 2015-01-10 NOTE — Progress Notes (Signed)
Patient Name: Antonio Page Date of Encounter: 01/10/2015     Principal Problem:   Syncope Active Problems:   Morbid obesity   OSA (obstructive sleep apnea)   Diabetes mellitus type 2, insulin dependent   Uncontrolled hypertension   Coronary artery disease   End stage renal disease on dialysis   Sarcoidosis of lung   Anemia in chronic kidney disease   Complete heart block    SUBJECTIVE  No complaints. Talked with Dr. Caryl Comes. Willing to proceed with PPM  CURRENT MEDS . allopurinol  150 mg Oral Daily  . amLODipine  10 mg Oral Daily  . aspirin EC  81 mg Oral Daily  . calcium carbonate  2 tablet Oral TID WC  . heparin  5,000 Units Subcutaneous 3 times per day  . insulin aspart  0-5 Units Subcutaneous QHS  . insulin aspart  0-9 Units Subcutaneous TID WC  . insulin aspart protamine- aspart  30 Units Subcutaneous Q breakfast  . loratadine  10 mg Oral Daily  . multivitamin  1 tablet Oral QHS  . pantoprazole  40 mg Oral Daily  . polyethylene glycol  17 g Oral Once per day on Sun Tue Thu Sat  . sevelamer carbonate  1,600 mg Oral TID WC  . sodium chloride  3 mL Intravenous Q12H    OBJECTIVE  Filed Vitals:   01/10/15 0816 01/10/15 0830 01/10/15 0900 01/10/15 0930  BP: 160/69 161/66 153/63 134/60  Pulse: 59 58 60 57  Temp:      TempSrc:      Resp: 16 14 16 13   Height:      Weight:      SpO2:        Intake/Output Summary (Last 24 hours) at 01/10/15 0959 Last data filed at 01/09/15 2014  Gross per 24 hour  Intake    230 ml  Output    475 ml  Net   -245 ml   Filed Weights   01/09/15 2350 01/10/15 0400 01/10/15 0811  Weight: 268 lb 4.8 oz (121.7 kg) 268 lb 4.8 oz (121.7 kg) 276 lb 7.3 oz (125.4 kg)    PHYSICAL EXAM  General: Weak appearing. 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 no JVD. Carotids 2+ bilat; no bruits. No lymphadenopathy or thryomegaly appreciated. Cor: PMI nondisplaced. Regular rate & rhythm. No  rubs, gallops or murmurs. Lungs: rales bases Abdomen: soft, nontender, nondistended. No hepatosplenomegaly. No bruits or masses. Good bowel sounds. Extremities: no cyanosis, clubbing, rash, trace edema; HD access RUE with palpable thrill Neuro: alert & oriented x 3, cranial nerves grossly intact. moves all 4 extremities w/o difficulty. Affect pleasant.  Accessory Clinical Findings  CBC  Recent Labs  01/08/15 0807  01/09/15 0325 01/09/15 1425  WBC 6.3  --  7.7 9.7  NEUTROABS 4.6  --   --   --   HGB 10.9*  < > 10.7* 10.6*  HCT 33.7*  < > 33.7* 33.2*  MCV 96.3  --  99.1 97.9  PLT 107*  --  90* 103*  < > = values in this interval not displayed. Basic Metabolic Panel  Recent Labs  01/08/15 0807  01/08/15 1330 01/08/15 1730 01/09/15 0325  NA 143  --  141  --  143  K 5.5*  < > 6.2*  --  4.7  CL 101  --  105  --  101  CO2 26  --   --   --  30  GLUCOSE 153*  --  175*  --  99  BUN 60*  --  65*  --  35*  CREATININE 9.91*  --  9.40*  --  7.36*  CALCIUM 8.9  --   --   --  9.5  MG  --   --   --  2.0  --   < > = values in this interval not displayed. Liver Function Tests  Recent Labs  01/09/15 0325  AST 16  ALT 13*  ALKPHOS 105  BILITOT 0.8  PROT 6.1*  ALBUMIN 3.5   No results for input(s): LIPASE, AMYLASE in the last 72 hours. Cardiac Enzymes  Recent Labs  01/08/15 0807  TROPONINI 0.03   BNP Invalid input(s): POCBNP D-Dimer No results for input(s): DDIMER in the last 72 hours. Hemoglobin A1C  Recent Labs  01/08/15 1242  HGBA1C 6.0*   Fasting Lipid Panel No results for input(s): CHOL, HDL, LDLCALC, TRIG, CHOLHDL, LDLDIRECT in the last 72 hours. Thyroid Function Tests  Recent Labs  01/08/15 1730  TSH 3.422    TELE  NSR with a couple episodes of CHB  Radiology/Studies  Dg Chest 2 View  01/08/2015   CLINICAL DATA:  74 year old male with syncope and malaise. Current history of dialysis. Initial encounter.  EXAM: CHEST  2 VIEW  COMPARISON:   10/03/2014 and earlier.  FINDINGS: Increased pulmonary vascular congestion. Mildly lower lung volumes with chronic elevation of the left hemidiaphragm. Chronic scarring or atelectasis at the left lung base. No pneumothorax. Stable cardiac size and mediastinal contours. No consolidation or pleural effusion. No acute osseous abnormality identified. Visualized tracheal air column is within normal limits. Right subclavian vascular stent re- identified.  IMPRESSION: Increased vascular congestion/mild interstitial edema.  No pleural effusion or other acute cardiopulmonary process identified. Chronic elevation of the left hemidiaphragm with adjacent scarring/atelectasis.   Electronically Signed   By: Genevie Ann M.D.   On: 01/08/2015 09:10   Ct Head Wo Contrast  01/08/2015   CLINICAL DATA:  Two syncope episodes this morning.  EXAM: CT HEAD WITHOUT CONTRAST  TECHNIQUE: Contiguous axial images were obtained from the base of the skull through the vertex without contrast.  COMPARISON:  None  FINDINGS: There is low density throughout the periventricular white matter suggesting chronic changes. There is mild cerebral atrophy. No evidence for acute hemorrhage, mass lesion, midline shift, hydrocephalus or large infarct. The right globe is dense with postoperative changes. Small amount of fluid in the right mastoid air cells. Mucosal thickening and possibly fluid in left sphenoid sinus. No acute bone abnormality.  IMPRESSION: No acute intracranial abnormality.  Mild atrophy and evidence for chronic small vessel ischemic changes.  Left sphenoid sinus disease.   Electronically Signed   By: Markus Daft M.D.   On: 01/08/2015 09:40   Ct Angio Chest Pe W/cm &/or Wo Cm  01/08/2015   CLINICAL DATA:  Dyspnea.  Dialysis today.  EXAM: CT ANGIOGRAPHY CHEST WITH CONTRAST  TECHNIQUE: Multidetector CT imaging of the chest was performed using the standard protocol during bolus administration of intravenous contrast. Multiplanar CT image  reconstructions and MIPs were obtained to evaluate the vascular anatomy.  CONTRAST:  171mL OMNIPAQUE IOHEXOL 350 MG/ML SOLN  COMPARISON:  Radiographs 01/08/2015  FINDINGS: Cardiovascular: There is good opacification of the pulmonary arteries. There is no pulmonary embolism. The thoracic aorta is normal in caliber and intact. There is extensive calcified coronary artery plaque.  Lungs: There is interlobular septal thickening consistent with interstitial fluid.  There are ground-glass opacities which likely represent alveolar edema. The findings are most suggestive of congestive heart failure. Mild atelectatic-appearing airspace opacity is present in the dependent posterior bases bilaterally.  Central airways: Patent  Effusions: Very small effusions bilaterally.  Lymphadenopathy: There are a few nonspecific nodes in the mediastinum. No pathologic adenopathy is evident.  Esophagus: Unremarkable  Upper abdomen: A transvenous lead extends up the IVC and into the right ventricle, presumably a temporary pacer.  Musculoskeletal: No acute musculoskeletal abnormalities.  Review of the MIP images confirms the above findings.  IMPRESSION: *Negative for pulmonary embolism *Interstitial and alveolar edema consistent with congestive heart failure. Very small pleural effusions bilaterally. *Extensive calcified coronary artery plaque.   Electronically Signed   By: Andreas Newport M.D.   On: 01/08/2015 22:55   Dg Chest Port 1 View  01/08/2015   CLINICAL DATA:  Temporary pacemaker placement.  EXAM: PORTABLE CHEST - 1 VIEW  COMPARISON:  01/08/2015 and 0900 hr  FINDINGS: Cardiopericardial silhouette remains enlarged. Interstitial pulmonary edema is present diffusely with mild alveolar pulmonary edema in the RIGHT midlung. Defibrillator pads overlie the chest. Since the prior exam, the monitoring leads and defibrillator pads are new.  There is also a new inferior approach catheter which terminates in the region of the RIGHT  ventricular apex, probably representing trans venous pacemaker.  Question LEFT pleural effusion.  IMPRESSION: 1. New catheter from inferior approach, presumably representing femoral approach transvenous pacemaker. The distal tip is in the region of the RIGHT ventricular apex. 2. Mild to moderate CHF with interstitial and mild alveolar pulmonary edema.   Electronically Signed   By: Dereck Ligas M.D.   On: 01/08/2015 16:01    ASSESSMENT AND PLAN  Antonio Page is a 74 y.o. male with a history of sarcoid, CAD w/ RCA stent '99, ESRD on HD M/W/F, nl LV, HTN, DM and OSA on CPAP who presented to [facility] ED on 01/08/15 with multiple episodes of syncope. In the ED he was found to have intermittent 3rd deg heart block and hyperkalemia.   Symptomatic bradycardia- in the setting of mild hyperkalemia (5.8 and 6.2 repeat) and he underwent emergent temporary pacemaker placement on 01/08/15 after a syncopal episode. He seemed to stabilize and the temporary PM was removed and he has been kept inpatient for continued observation. He has had a few episodes of complete heart block noted on telemetry. After a long conversation with Dr. Caryl Comes and the family, it has been decided to proceed with permanent pacemaker placement. He is felt to be high risk for recurrent syncopal events in the setting of underlying conductions disease (RBBB and LPFB).  His K was not high enough to fully explain his presentation and Dr Melvia Heaps w/ nephrology felt that that there was no safe, reliable way to prevent mild-mod hyperkalemia in this ESRD pt from time to time. He agreed with pacemaker placement. The risks and benefits of pacemaker placement were discussed in length and the family and patient are very clear that they would like proceed with PPM. They are aware of the increased risk of infection in the HD patient. This will be done today or tomorrow depending on scheduling.  Hyperkalemia- This is now resolved. K 4.7 yesterday. He went for HD  today. BMET pending today.    Judy Pimple PA-C  Pager [phone]  Please see the addendum  Regarding discussion of intermittent complete heart block and hyperkalemia.  He remains dyspneic. Chest x-ray  was abnormal. We will  repeat it today.

## 2015-01-10 NOTE — Progress Notes (Addendum)
Lengthy visit with family and discussion with Nephrology  The Pt has bifasicular block and the concern from nephrology is that there is no good and reliable way to protect him from K levels 5.8-6.2   So IF they were responsible this is not a reversible trirgger and at that level, they may have not been contributory anyway and that the CHB is more closely related to the underlying conduction disease than the K  Hence we will proceed with pacing   The benefits and risks were reviewed including but not limited to death,  perforation, infection, lead dislodgement and device malfunction.  The patient understands agrees and is willing to proceed.  Will try and do today but schedule may not permit Time spent 45 min incl time with pt and family

## 2015-01-10 NOTE — Progress Notes (Signed)
Medical Consultation Follow Progress Note             PATIENT DETAILS Name: Antonio Page Age: 74 y.o. Sex: male Date of Birth: Jan 08, 1941 Admit Date: 01/08/2015 Admitting Physician Evans Lance, MD BLT:[id number] A, MD  Subjective: No SOB/Chest pain this am.   Assessment/Plan: Principal Problem:   Syncope:secondary to complete heart block. Defer to Primary service  Active Problems:   Complete heart block:?secondary to hyperkalemia. Re-evaluated by EP-underwent PPM placement on 5/4    Hyperkalemia:resolved post HD. Follow lytes    ESRD: per renal    OSA (obstructive sleep apnea):continue CPAP qhs    Diabetes mellitus type 2, insulin dependent:CBG's stable, continue Insulin 70/30 at 30 units and SSI. Suspect non compliance to diet at home-therefore requires higher dosing of insulin at home.     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   Anti-infectives    Start     Dose/Rate Route Frequency Ordered Stop   01/11/15 0100  ceFAZolin (ANCEF) IVPB 1 g/50 mL premix     1 g 100 mL/hr over 30 Minutes Intravenous Every 6 hours 01/10/15 1413 01/11/15 1259   01/10/15 1300  gentamicin (GARAMYCIN) 80 mg in sodium chloride irrigation 0.9 % 500 mL irrigation  Status:  Discontinued     80 mg Irrigation On call 01/10/15 1134 01/10/15 1401   01/10/15 1300  ceFAZolin (ANCEF) 3 g in dextrose 5 % 50 mL IVPB     3 g 160 mL/hr over 30 Minutes Intravenous On call 01/10/15 1134 01/10/15 1330   01/08/15 1515  ceFAZolin (ANCEF) 3 g in dextrose 5 % 50 mL IVPB  Status:  Discontinued     3 g 160 mL/hr over 30 Minutes Intravenous To Cath Lab 01/08/15 1446 01/09/15 1205   01/08/15  1500  gentamicin (GARAMYCIN) 80 mg in sodium chloride irrigation 0.9 % 500 mL irrigation  Status:  Discontinued     80 mg Irrigation To Cath Lab 01/08/15 1446 01/09/15 1205     Code Status: Full code   Family Communication Multiple family members at bedside  Time spent 20 minutes-which includes 50% of the time with face-to-face with patient/ family and coordinating care related to the above assessment and plan.  MEDICATIONS: Scheduled Meds: . allopurinol  150 mg Oral Daily  . amLODipine  10 mg Oral Daily  . aspirin EC  81 mg Oral Daily  . calcium carbonate  2 tablet Oral TID WC  . [START ON 01/11/2015]  ceFAZolin (ANCEF) IV  1 g Intravenous Q6H  . insulin aspart  0-5 Units Subcutaneous QHS  . insulin aspart  0-9 Units Subcutaneous TID WC  . insulin aspart protamine- aspart  30 Units Subcutaneous Q breakfast  . loratadine  10 mg Oral Daily  . multivitamin  1 tablet Oral QHS  . pantoprazole  40 mg Oral Daily  . polyethylene glycol  17 g Oral Once per day on Sun Tue Thu Sat  . sevelamer carbonate  1,600 mg Oral TID WC  . sodium chloride  3 mL Intravenous Q12H   Continuous Infusions: . DOPamine Stopped (01/08/15 1500)   PRN Meds:.sodium chloride, sodium chloride, sodium chloride, acetaminophen, ALPRAZolam, feeding supplement (NEPRO CARB STEADY), heparin, heparin, hydrALAZINE, lidocaine (PF),  lidocaine-prilocaine, nitroGLYCERIN, ondansetron **OR** ondansetron (ZOFRAN) IV, pentafluoroprop-tetrafluoroeth, sodium chloride, sodium chloride, zolpidem    PHYSICAL EXAM: Vital signs in last 24 hours: Filed Vitals:   01/10/15 1335 01/10/15 1340 01/10/15 1340 01/10/15 1439  BP:   178/67 175/64  Pulse: 66 69 66   Temp:      TempSrc:      Resp: 18 16 11    Height:      Weight:      SpO2: 100% 99% 100%     Weight change: -7.3 kg (-16 lb 1.5 oz) Filed Weights   01/10/15 0400 01/10/15 0811 01/10/15 1111  Weight: 121.7 kg (268 lb 4.8 oz) 125.4 kg (276 lb 7.3 oz) 121.7 kg (268 lb 4.8 oz)    Body mass index is 37.44 kg/(m^2).   Gen Exam: Awake and alert with clear speech.  Neck: Supple, No JVD.   Chest: B/L Clear.  No rales  CVS: S1 S2 Regular, no murmurs. PPM site-dressing intact-no soakage Abdomen: soft, BS +, non tender, non distended.  Extremities: no edema, lower extremities warm to touch. Neurologic: Non Focal.   Skin: No Rash.   Wounds: N/A.   Intake/Output from previous day:  Intake/Output Summary (Last 24 hours) at 01/10/15 1546 Last data filed at 01/10/15 1111  Gross per 24 hour  Intake    120 ml  Output   1350 ml  Net  -1230 ml     LAB RESULTS: CBC  Recent Labs Lab 01/08/15 0807 01/08/15 1330 01/09/15 0325 01/09/15 1425  WBC 6.3  --  7.7 9.7  HGB 10.9* 12.6* 10.7* 10.6*  HCT 33.7* 37.0* 33.7* 33.2*  PLT 107*  --  90* 103*  MCV 96.3  --  99.1 97.9  MCH 31.1  --  31.5 31.3  MCHC 32.3  --  31.8 31.9  RDW 16.5*  --  16.5* 16.1*  LYMPHSABS 1.1  --   --   --   MONOABS 0.4  --   --   --   EOSABS 0.3  --   --   --   BASOSABS 0.0  --   --   --     Chemistries   Recent Labs Lab 01/08/15 0807 01/08/15 1242 01/08/15 1330 01/08/15 1730 01/09/15 0325  NA 143  --  141  --  143  K 5.5* 6.0* 6.2*  --  4.7  CL 101  --  105  --  101  CO2 26  --   --   --  30  GLUCOSE 153*  --  175*  --  99  BUN 60*  --  65*  --  35*  CREATININE 9.91*  --  9.40*  --  7.36*  CALCIUM 8.9  --   --   --  9.5  MG  --   --   --  2.0  --     CBG:  Recent Labs Lab 01/09/15 1611 01/09/15 2112 01/10/15 0740 01/10/15 1031 01/10/15 1406  GLUCAP 73 94 118* 132* 124*    GFR Estimated Creatinine Clearance: 11.5 mL/min (by C-G formula based on Cr of 7.36).  Coagulation profile No results for input(s): INR, PROTIME in the last 168 hours.  Cardiac Enzymes  Recent Labs Lab 01/08/15 0807  TROPONINI 0.03    Invalid input(s): POCBNP No results for input(s): DDIMER in the last 72 hours.  Recent Labs  01/08/15 1242  HGBA1C 6.0*   No results for  input(s): CHOL, HDL, LDLCALC, TRIG, CHOLHDL, LDLDIRECT in the  last 72 hours.  Recent Labs  01/08/15 1730  TSH 3.422   No results for input(s): VITAMINB12, FOLATE, FERRITIN, TIBC, IRON, RETICCTPCT in the last 72 hours. No results for input(s): LIPASE, AMYLASE in the last 72 hours.  Urine Studies No results for input(s): UHGB, CRYS in the last 72 hours.  Invalid input(s): UACOL, UAPR, USPG, UPH, UTP, UGL, UKET, UBIL, UNIT, UROB, ULEU, UEPI, UWBC, URBC, UBAC, CAST, UCOM, BILUA  MICROBIOLOGY: Recent Results (from the past 240 hour(s))  MRSA PCR Screening     Status: None   Collection Time: 01/08/15  2:40 PM  Result Value Ref Range Status   MRSA by PCR NEGATIVE NEGATIVE Final    Comment:        The GeneXpert MRSA Assay (FDA approved for NASAL specimens only), is one component of a comprehensive MRSA colonization surveillance program. It is not intended to diagnose MRSA infection nor to guide or monitor treatment for MRSA infections.     RADIOLOGY STUDIES/RESULTS: Dg Chest 2 View  01/08/2015   CLINICAL DATA:  74 year old male with syncope and malaise. Current history of dialysis. Initial encounter.  EXAM: CHEST  2 VIEW  COMPARISON:  10/03/2014 and earlier.  FINDINGS: Increased pulmonary vascular congestion. Mildly lower lung volumes with chronic elevation of the left hemidiaphragm. Chronic scarring or atelectasis at the left lung base. No pneumothorax. Stable cardiac size and mediastinal contours. No consolidation or pleural effusion. No acute osseous abnormality identified. Visualized tracheal air column is within normal limits. Right subclavian vascular stent re- identified.  IMPRESSION: Increased vascular congestion/mild interstitial edema.  No pleural effusion or other acute cardiopulmonary process identified. Chronic elevation of the left hemidiaphragm with adjacent scarring/atelectasis.   Electronically Signed   By: Genevie Ann M.D.   On: 01/08/2015 09:10   Ct Head Wo  Contrast  01/08/2015   CLINICAL DATA:  Two syncope episodes this morning.  EXAM: CT HEAD WITHOUT CONTRAST  TECHNIQUE: Contiguous axial images were obtained from the base of the skull through the vertex without contrast.  COMPARISON:  None  FINDINGS: There is low density throughout the periventricular white matter suggesting chronic changes. There is mild cerebral atrophy. No evidence for acute hemorrhage, mass lesion, midline shift, hydrocephalus or large infarct. The right globe is dense with postoperative changes. Small amount of fluid in the right mastoid air cells. Mucosal thickening and possibly fluid in left sphenoid sinus. No acute bone abnormality.  IMPRESSION: No acute intracranial abnormality.  Mild atrophy and evidence for chronic small vessel ischemic changes.  Left sphenoid sinus disease.   Electronically Signed   By: Markus Daft M.D.   On: 01/08/2015 09:40   Ct Angio Chest Pe W/cm &/or Wo Cm  01/08/2015   CLINICAL DATA:  Dyspnea.  Dialysis today.  EXAM: CT ANGIOGRAPHY CHEST WITH CONTRAST  TECHNIQUE: Multidetector CT imaging of the chest was performed using the standard protocol during bolus administration of intravenous contrast. Multiplanar CT image reconstructions and MIPs were obtained to evaluate the vascular anatomy.  CONTRAST:  148mL OMNIPAQUE IOHEXOL 350 MG/ML SOLN  COMPARISON:  Radiographs 01/08/2015  FINDINGS: Cardiovascular: There is good opacification of the pulmonary arteries. There is no pulmonary embolism. The thoracic aorta is normal in caliber and intact. There is extensive calcified coronary artery plaque.  Lungs: There is interlobular septal thickening consistent with interstitial fluid. There are ground-glass opacities which likely represent alveolar edema. The findings are most suggestive of congestive heart failure. Mild atelectatic-appearing airspace opacity is present in the dependent posterior bases  bilaterally.  Central airways: Patent  Effusions: Very small effusions  bilaterally.  Lymphadenopathy: There are a few nonspecific nodes in the mediastinum. No pathologic adenopathy is evident.  Esophagus: Unremarkable  Upper abdomen: A transvenous lead extends up the IVC and into the right ventricle, presumably a temporary pacer.  Musculoskeletal: No acute musculoskeletal abnormalities.  Review of the MIP images confirms the above findings.  IMPRESSION: *Negative for pulmonary embolism *Interstitial and alveolar edema consistent with congestive heart failure. Very small pleural effusions bilaterally. *Extensive calcified coronary artery plaque.   Electronically Signed   By: Andreas Newport M.D.   On: 01/08/2015 22:55   Dg Chest Port 1 View  01/08/2015   CLINICAL DATA:  Temporary pacemaker placement.  EXAM: PORTABLE CHEST - 1 VIEW  COMPARISON:  01/08/2015 and 0900 hr  FINDINGS: Cardiopericardial silhouette remains enlarged. Interstitial pulmonary edema is present diffusely with mild alveolar pulmonary edema in the RIGHT midlung. Defibrillator pads overlie the chest. Since the prior exam, the monitoring leads and defibrillator pads are new.  There is also a new inferior approach catheter which terminates in the region of the RIGHT ventricular apex, probably representing trans venous pacemaker.  Question LEFT pleural effusion.  IMPRESSION: 1. New catheter from inferior approach, presumably representing femoral approach transvenous pacemaker. The distal tip is in the region of the RIGHT ventricular apex. 2. Mild to moderate CHF with interstitial and mild alveolar pulmonary edema.   Electronically Signed   By: Dereck Ligas M.D.   On: 01/08/2015 16:01    Oren Binet, MD  [facility] Pager:[phone]  If 7PM-7AM, please contact night-coverage [email or web address] Password [email or web address] 01/10/2015, 3:46 PM   LOS: 2 days

## 2015-01-10 NOTE — Progress Notes (Signed)
[facility] Progress Note   Subjective: 3.5kg off w HD yest, BP's up, and no further CHB overnight. K 4.7 today. Pt feeling better, SOB resolved.  Filed Vitals:   01/10/15 0816 01/10/15 0830 01/10/15 0900 01/10/15 0930  BP: 160/69 161/66 153/63 134/60  Pulse: 59 58 60 57  Temp:      TempSrc:      Resp: 16 14 16 13   Height:      Weight:      SpO2:       Exam: Alert, lying flat, no distress No jvd Chest clear bilat RRR no MRG Abd obese, ntnd, +BS, no mass Trace pretib edema Neuro is alert and Ox 3  HD: [facility] 4h 76min 126kg 2/2 bath Heparin 4000  Mircera 100/ 2 wks last 4-20 Calcitriol 0.5 tiw w hd   Assessment: 1. Syncope / complete heart block - don't believe that there is a safe, reliable way to prevent mild-mod hyperkalemia in this ESRD pt from time to time, as is the case for most dialysis patients. Have d/w cardiology and plan is for pacemaker placement.  2. ESRD on HD MWF 3. Pulm edema/ ^vol/ dyspnea - resolved 4. Hyperkalemia - resolved 5. HTN on norvasc, ARB on hold 6. 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 Gout   Plan - HD today, lower dry wt if possible.     Kelly Splinter MD  pager [phone]    cell [phone]  01/10/2015, 9:59 AM     Recent Labs Lab 01/08/15 0807 01/08/15 1242 01/08/15 1330 01/09/15 0325  NA 143  --  141 143  K 5.5* 6.0* 6.2* 4.7  CL 101  --  105 101  CO2 26  --   --  30  GLUCOSE 153*  --  175* 99  BUN 60*  --  65* 35*  CREATININE 9.91*  --  9.40* 7.36*  CALCIUM 8.9  --   --  9.5    Recent Labs Lab 01/09/15 0325  AST 16  ALT 13*  ALKPHOS 105  BILITOT 0.8  PROT 6.1*  ALBUMIN 3.5    Recent Labs Lab 01/08/15 0807 01/08/15 1330 01/09/15 0325 01/09/15 1425  WBC 6.3  --  7.7 9.7  NEUTROABS 4.6  --   --   --   HGB 10.9* 12.6* 10.7* 10.6*  HCT 33.7* 37.0* 33.7* 33.2*  MCV 96.3  --  99.1 97.9  PLT 107*  --  90* 103*   . allopurinol  150 mg Oral Daily  . amLODipine  10 mg Oral Daily  .  aspirin EC  81 mg Oral Daily  . calcium carbonate  2 tablet Oral TID WC  . heparin  5,000 Units Subcutaneous 3 times per day  . insulin aspart  0-5 Units Subcutaneous QHS  . insulin aspart  0-9 Units Subcutaneous TID WC  . insulin aspart protamine- aspart  30 Units Subcutaneous Q breakfast  . loratadine  10 mg Oral Daily  . multivitamin  1 tablet Oral QHS  . pantoprazole  40 mg Oral Daily  . polyethylene glycol  17 g Oral Once per day on Sun Tue Thu Sat  . sevelamer carbonate  1,600 mg Oral TID WC  . sodium chloride  3 mL Intravenous Q12H   . DOPamine Stopped (01/08/15 1500)   sodium chloride, sodium chloride, sodium chloride, acetaminophen, ALPRAZolam, feeding supplement (NEPRO CARB STEADY), heparin, heparin, hydrALAZINE, lidocaine (PF), lidocaine-prilocaine, nitroGLYCERIN, ondansetron **OR** ondansetron (ZOFRAN) IV, pentafluoroprop-tetrafluoroeth, sodium chloride, sodium  chloride, zolpidem

## 2015-01-11 ENCOUNTER — Inpatient Hospital Stay (HOSPITAL_COMMUNITY): Payer: Medicare Other

## 2015-01-11 ENCOUNTER — Encounter (HOSPITAL_COMMUNITY): Payer: Self-pay | Admitting: Cardiology

## 2015-01-11 DIAGNOSIS — R0902 Hypoxemia: Secondary | ICD-10-CM

## 2015-01-11 DIAGNOSIS — E875 Hyperkalemia: Secondary | ICD-10-CM | POA: Diagnosis present

## 2015-01-11 HISTORY — DX: Hypoxemia: R09.02

## 2015-01-11 LAB — RENAL FUNCTION PANEL
Albumin: 3.4 g/dL — ABNORMAL LOW (ref 3.5–5.0)
Anion gap: 11 (ref 5–15)
BUN: 46 mg/dL — ABNORMAL HIGH (ref 6–20)
CALCIUM: 9.1 mg/dL (ref 8.9–10.3)
CO2: 27 mmol/L (ref 22–32)
CREATININE: 8.58 mg/dL — AB (ref 0.61–1.24)
Chloride: 102 mmol/L (ref 101–111)
GFR calc Af Amer: 6 mL/min — ABNORMAL LOW (ref >60)
GFR calc non Af Amer: 5 mL/min — ABNORMAL LOW (ref >60)
GLUCOSE: 149 mg/dL — AB (ref 70–99)
Phosphorus: 5.4 mg/dL — ABNORMAL HIGH (ref 2.5–4.6)
Potassium: 4.6 mmol/L (ref 3.5–5.1)
Sodium: 140 mmol/L (ref 135–145)

## 2015-01-11 LAB — GLUCOSE, CAPILLARY
GLUCOSE-CAPILLARY: 149 mg/dL — AB (ref 70–99)
GLUCOSE-CAPILLARY: 90 mg/dL (ref 70–99)
GLUCOSE-CAPILLARY: 118 mg/dL — AB (ref 70–99)
Glucose-Capillary: 166 mg/dL — ABNORMAL HIGH (ref 70–99)

## 2015-01-11 MED ORDER — PENTAFLUOROPROP-TETRAFLUOROETH EX AERO: 1.0000 "application " | INHALATION_SPRAY | CUTANEOUS | Status: DC | PRN

## 2015-01-11 MED ORDER — SODIUM CHLORIDE 0.9 % IV SOLN: 100.0000 mL | INTRAVENOUS | Status: DC | PRN

## 2015-01-11 MED ORDER — LIDOCAINE HCL (PF) 1 % IJ SOLN: 5.0000 mL | INTRAMUSCULAR | Status: DC | PRN

## 2015-01-11 MED ORDER — ALTEPLASE 2 MG IJ SOLR
2.0000 mg | Freq: Once | INTRAMUSCULAR | Status: AC | PRN
  Filled 2015-01-11: qty 2

## 2015-01-11 MED ORDER — LIDOCAINE-PRILOCAINE 2.5-2.5 % EX CREA: 1.0000 "application " | TOPICAL_CREAM | CUTANEOUS | Status: DC | PRN

## 2015-01-11 MED ORDER — NEPRO/CARBSTEADY PO LIQD
237.0000 mL | ORAL | Status: DC | PRN
  Filled 2015-01-11: qty 237

## 2015-01-11 MED ORDER — HEPARIN SODIUM (PORCINE) 1000 UNIT/ML DIALYSIS: 1000.0000 [IU] | INTRAMUSCULAR | Status: DC | PRN

## 2015-01-11 MED ORDER — HEPARIN SODIUM (PORCINE) 1000 UNIT/ML DIALYSIS: 2000.0000 [IU] | INTRAMUSCULAR | Status: DC | PRN

## 2015-01-11 NOTE — Progress Notes (Signed)
Medical Consultation Follow Progress Note             PATIENT DETAILS Name: Antonio Page Age: 74 y.o. Sex: male Date of Birth: [birth year] Admit Date: 01/08/2015 Admitting Physician Evans Lance, MD ZES:[id number], MD  Subjective: No SOB-anxious to go home  Assessment/Plan: Principal Problem:   Syncope:secondary to complete heart block. Defer to Primary service  Active Problems:   Complete heart block:?secondary to hyperkalemia. Underwent PPM placement on 5/4    Hyperkalemia:resolved post HD. Follow lytes    ESRD: per renal    OSA (obstructive sleep apnea):continue CPAP qhs    Diabetes mellitus type 2, insulin dependent:CBG's stable, continue Insulin 70/30 at 30 units and SSI. Suspect non compliance to diet at home-therefore requires higher dosing of insulin at home.     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    Morbid obesity/OSA:Uses CPAP compliant at home    Anemia in chronic kidney disease:Hemoglobin stable and at baseline  Hospitalist service will sign off   Anti-infectives    Start     Dose/Rate Route Frequency Ordered Stop   01/11/15 0100  ceFAZolin (ANCEF) IVPB 1 g/50 mL premix     1 g 100 mL/hr over 30 Minutes Intravenous Every 6 hours 01/10/15 1413 01/11/15 0658   01/10/15 1300  gentamicin (GARAMYCIN) 80 mg in sodium chloride irrigation 0.9 % 500 mL irrigation  Status:  Discontinued     80 mg Irrigation On call 01/10/15 1134 01/10/15 1401   01/10/15 1300  ceFAZolin (ANCEF) 3 g in dextrose 5 % 50 mL IVPB     3 g 160 mL/hr over 30 Minutes Intravenous On call 01/10/15 1134 01/10/15 1330   01/08/15 1515  ceFAZolin (ANCEF) 3 g in dextrose 5 % 50 mL IVPB  Status:  Discontinued     3 g 160 mL/hr over 30 Minutes Intravenous To Cath Lab 01/08/15 1446 01/09/15 1205   01/08/15 1500  gentamicin (GARAMYCIN) 80 mg in sodium  chloride irrigation 0.9 % 500 mL irrigation  Status:  Discontinued     80 mg Irrigation To Cath Lab 01/08/15 1446 01/09/15 1205     Time spent 84minutes-which includes 50% of the time with face-to-face with patient/ family and coordinating care related to the above assessment and plan.  MEDICATIONS: Scheduled Meds: . allopurinol  150 mg Oral Daily  . amLODipine  10 mg Oral Daily  . aspirin EC  81 mg Oral Daily  . calcium carbonate  2 tablet Oral TID WC  . insulin aspart  0-5 Units Subcutaneous QHS  . insulin aspart  0-9 Units Subcutaneous TID WC  . insulin aspart protamine- aspart  30 Units Subcutaneous Q breakfast  . loratadine  10 mg Oral Daily  . multivitamin  1 tablet Oral QHS  . pantoprazole  40 mg Oral Daily  . polyethylene glycol  17 g Oral Once per day on Sun Tue Thu Sat  . sevelamer carbonate  1,600 mg Oral TID WC  . sodium chloride  3 mL Intravenous Q12H   Continuous Infusions: . DOPamine Stopped (01/08/15 1500)   PRN Meds:.sodium chloride, sodium chloride, sodium chloride, acetaminophen, ALPRAZolam, feeding supplement (NEPRO CARB STEADY), heparin, heparin, hydrALAZINE, lidocaine (PF), lidocaine-prilocaine, nitroGLYCERIN, ondansetron **OR** ondansetron (ZOFRAN) IV, pentafluoroprop-tetrafluoroeth, sodium chloride, sodium chloride, zolpidem    PHYSICAL EXAM: Vital signs in last 24 hours: Filed Vitals:   01/10/15 2050 01/10/15 2300 01/10/15 2342 01/11/15  0421  BP:   131/26 155/44  Pulse: 66 65 65 60  Temp:   98.8 F (37.1 C) 98.4 F (36.9 C)  TempSrc:   Oral Oral  Resp: 19 19 20 15   Height:      Weight:    123.696 kg (272 lb 11.2 oz)  SpO2: 96%   97%    Weight change: 3.7 kg (8 lb 2.5 oz) Filed Weights   01/10/15 0811 01/10/15 1111 01/11/15 0421  Weight: 125.4 kg (276 lb 7.3 oz) 121.7 kg (268 lb 4.8 oz) 123.696 kg (272 lb 11.2 oz)   Body mass index is 38.05 kg/(m^2).   Gen Exam: Awake and alert with clear speech. Not in any distress Neck: Supple, No JVD.    Chest: B/L Clear.  No rales or rhonchi CVS: S1 S2 Regular, no murmurs. PPM site-dressing intact-no soakage Abdomen: soft, BS +, non tender, non distended.  Extremities: no edema, lower extremities warm to touch. Neurologic: Non Focal.   Skin: No Rash.   Wounds: N/A.   Intake/Output from previous day:  Intake/Output Summary (Last 24 hours) at 01/11/15 0912 Last data filed at 01/10/15 1700  Gross per 24 hour  Intake    482 ml  Output   1100 ml  Net   -618 ml     LAB RESULTS: CBC  Recent Labs Lab 01/08/15 0807 01/08/15 1330 01/09/15 0325 01/09/15 1425  WBC 6.3  --  7.7 9.7  HGB 10.9* 12.6* 10.7* 10.6*  HCT 33.7* 37.0* 33.7* 33.2*  PLT 107*  --  90* 103*  MCV 96.3  --  99.1 97.9  MCH 31.1  --  31.5 31.3  MCHC 32.3  --  31.8 31.9  RDW 16.5*  --  16.5* 16.1*  LYMPHSABS 1.1  --   --   --   MONOABS 0.4  --   --   --   EOSABS 0.3  --   --   --   BASOSABS 0.0  --   --   --     Chemistries   Recent Labs Lab 01/08/15 0807 01/08/15 1242 01/08/15 1330 01/08/15 1730 01/09/15 0325 01/10/15 1520 01/11/15 0353  NA 143  --  141  --  143 141 140  K 5.5* 6.0* 6.2*  --  4.7 4.4 4.6  CL 101  --  105  --  101 102 102  CO2 26  --   --   --  30 26 27   GLUCOSE 153*  --  175*  --  99 146* 149*  BUN 60*  --  65*  --  35* 33* 46*  CREATININE 9.91*  --  9.40*  --  7.36* 6.90* 8.58*  CALCIUM 8.9  --   --   --  9.5 8.9 9.1  MG  --   --   --  2.0  --   --   --     CBG:  Recent Labs Lab 01/10/15 1031 01/10/15 1406 01/10/15 1650 01/10/15 2021 01/11/15 0748  GLUCAP 132* 124* 167* 177* 149*    GFR Estimated Creatinine Clearance: 10 mL/min (by C-G formula based on Cr of 8.58).  Coagulation profile No results for input(s): INR, PROTIME in the last 168 hours.  Cardiac Enzymes  Recent Labs Lab 01/08/15 0807  TROPONINI 0.03    Invalid input(s): POCBNP No results for input(s): DDIMER in the last 72 hours.  Recent Labs  01/08/15 1242  HGBA1C 6.0*   No results  for input(s): CHOL,  HDL, LDLCALC, TRIG, CHOLHDL, LDLDIRECT in the last 72 hours.  Recent Labs  01/08/15 1730  TSH 3.422   No results for input(s): VITAMINB12, FOLATE, FERRITIN, TIBC, IRON, RETICCTPCT in the last 72 hours. No results for input(s): LIPASE, AMYLASE in the last 72 hours.  Urine Studies No results for input(s): UHGB, CRYS in the last 72 hours.  Invalid input(s): UACOL, UAPR, USPG, UPH, UTP, UGL, UKET, UBIL, UNIT, UROB, ULEU, UEPI, UWBC, URBC, UBAC, CAST, UCOM, BILUA  MICROBIOLOGY: Recent Results (from the past 240 hour(s))  MRSA PCR Screening     Status: None   Collection Time: 01/08/15  2:40 PM  Result Value Ref Range Status   MRSA by PCR NEGATIVE NEGATIVE Final    Comment:        The GeneXpert MRSA Assay (FDA approved for NASAL specimens only), is one component of a comprehensive MRSA colonization surveillance program. It is not intended to diagnose MRSA infection nor to guide or monitor treatment for MRSA infections.     RADIOLOGY STUDIES/RESULTS: Dg Chest 2 View  01/11/2015   CLINICAL DATA:  Pacemaker insertion on Tuesday  EXAM: CHEST  2 VIEW  COMPARISON:  CTA chest dated 01/08/2015  FINDINGS: Mild patchy left lower lobe opacity, likely atelectasis. Suspected small left pleural effusion. No pneumothorax.  Cardiomegaly.  Left subclavian pacemaker in satisfactory position.  Degenerative changes of the visualized thoracolumbar spine.  IMPRESSION: Left subclavian pacemaker in satisfactory position.  Mild patchy left lower lobe opacity, likely atelectasis.  Suspected small left pleural effusion.  No pneumothorax.   Electronically Signed   By: Julian Hy M.D.   On: 01/11/2015 08:39   Dg Chest 2 View  01/08/2015   CLINICAL DATA:  74 year old male with syncope and malaise. Current history of dialysis. Initial encounter.  EXAM: CHEST  2 VIEW  COMPARISON:  10/03/2014 and earlier.  FINDINGS: Increased pulmonary vascular congestion. Mildly lower lung volumes with  chronic elevation of the left hemidiaphragm. Chronic scarring or atelectasis at the left lung base. No pneumothorax. Stable cardiac size and mediastinal contours. No consolidation or pleural effusion. No acute osseous abnormality identified. Visualized tracheal air column is within normal limits. Right subclavian vascular stent re- identified.  IMPRESSION: Increased vascular congestion/mild interstitial edema.  No pleural effusion or other acute cardiopulmonary process identified. Chronic elevation of the left hemidiaphragm with adjacent scarring/atelectasis.   Electronically Signed   By: Genevie Ann M.D.   On: 01/08/2015 09:10   Ct Head Wo Contrast  01/08/2015   CLINICAL DATA:  Two syncope episodes this morning.  EXAM: CT HEAD WITHOUT CONTRAST  TECHNIQUE: Contiguous axial images were obtained from the base of the skull through the vertex without contrast.  COMPARISON:  None  FINDINGS: There is low density throughout the periventricular white matter suggesting chronic changes. There is mild cerebral atrophy. No evidence for acute hemorrhage, mass lesion, midline shift, hydrocephalus or large infarct. The right globe is dense with postoperative changes. Small amount of fluid in the right mastoid air cells. Mucosal thickening and possibly fluid in left sphenoid sinus. No acute bone abnormality.  IMPRESSION: No acute intracranial abnormality.  Mild atrophy and evidence for chronic small vessel ischemic changes.  Left sphenoid sinus disease.   Electronically Signed   By: Markus Daft M.D.   On: 01/08/2015 09:40   Ct Angio Chest Pe W/cm &/or Wo Cm  01/08/2015   CLINICAL DATA:  Dyspnea.  Dialysis today.  EXAM: CT ANGIOGRAPHY CHEST WITH CONTRAST  TECHNIQUE: Multidetector CT imaging of  the chest was performed using the standard protocol during bolus administration of intravenous contrast. Multiplanar CT image reconstructions and MIPs were obtained to evaluate the vascular anatomy.  CONTRAST:  125mL OMNIPAQUE IOHEXOL 350 MG/ML  SOLN  COMPARISON:  Radiographs 01/08/2015  FINDINGS: Cardiovascular: There is good opacification of the pulmonary arteries. There is no pulmonary embolism. The thoracic aorta is normal in caliber and intact. There is extensive calcified coronary artery plaque.  Lungs: There is interlobular septal thickening consistent with interstitial fluid. There are ground-glass opacities which likely represent alveolar edema. The findings are most suggestive of congestive heart failure. Mild atelectatic-appearing airspace opacity is present in the dependent posterior bases bilaterally.  Central airways: Patent  Effusions: Very small effusions bilaterally.  Lymphadenopathy: There are a few nonspecific nodes in the mediastinum. No pathologic adenopathy is evident.  Esophagus: Unremarkable  Upper abdomen: A transvenous lead extends up the IVC and into the right ventricle, presumably a temporary pacer.  Musculoskeletal: No acute musculoskeletal abnormalities.  Review of the MIP images confirms the above findings.  IMPRESSION: *Negative for pulmonary embolism *Interstitial and alveolar edema consistent with congestive heart failure. Very small pleural effusions bilaterally. *Extensive calcified coronary artery plaque.   Electronically Signed   By: Andreas Newport M.D.   On: 01/08/2015 22:55   Dg Chest Port 1 View  01/08/2015   CLINICAL DATA:  Temporary pacemaker placement.  EXAM: PORTABLE CHEST - 1 VIEW  COMPARISON:  01/08/2015 and 0900 hr  FINDINGS: Cardiopericardial silhouette remains enlarged. Interstitial pulmonary edema is present diffusely with mild alveolar pulmonary edema in the RIGHT midlung. Defibrillator pads overlie the chest. Since the prior exam, the monitoring leads and defibrillator pads are new.  There is also a new inferior approach catheter which terminates in the region of the RIGHT ventricular apex, probably representing trans venous pacemaker.  Question LEFT pleural effusion.  IMPRESSION: 1. New catheter  from inferior approach, presumably representing femoral approach transvenous pacemaker. The distal tip is in the region of the RIGHT ventricular apex. 2. Mild to moderate CHF with interstitial and mild alveolar pulmonary edema.   Electronically Signed   By: Dereck Ligas M.D.   On: 01/08/2015 16:01    Oren Binet, MD  [facility] Pager:[phone]  If 7PM-7AM, please contact night-coverage [email or web address] Password [email or web address] 01/11/2015, 9:12 AM   LOS: 3 days

## 2015-01-11 NOTE — Progress Notes (Addendum)
  [facility] Progress Note   Subjective: Pacemaker in now  Filed Vitals:   01/10/15 2050 01/10/15 2300 01/10/15 2342 01/11/15 0421  BP:   131/26 155/44  Pulse: 66 65 65 60  Temp:   98.8 F (37.1 C) 98.4 F (36.9 C)  TempSrc:   Oral Oral  Resp: 19 19 20 15   Height:      Weight:    123.696 kg (272 lb 11.2 oz)  SpO2: 96%   97%   Exam: Alert, lying flat, no distress No jvd Chest clear bilat RRR no MRG Abd obese, ntnd, +BS, no mass Trace pretib edema Neuro is alert and Ox 3  HD: [facility]WF [facility] 4h 35min 126kg 2/2 bath Heparin 4000  Mircera 100/ 2 wks last 4-20 Calcitriol 0.5 tiw w hd   Assessment: 1. Syncope / complete heart block - s/p PPM 5/4 2. ESRD on HD MWF 3. Pulm edema/ ^vol - no edema on CXR today, down 5-7kg since admit and under dry wt  4. Hyperkalemia - resolved 5. HTN on norvasc, ARB on hold 6. 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 Gout 9. DIspo - possible dc today per primary  Plan - wrote HD orders in case still here tomorrow    Kelly Splinter MD  pager [phone]    cell [phone]  01/11/2015, 9:05 AM     Recent Labs Lab 01/09/15 0325 01/10/15 1520 01/11/15 0353  NA 143 141 140  K 4.7 4.4 4.6  CL 101 102 102  CO2 30 26 27   GLUCOSE 99 146* 149*  BUN 35* 33* 46*  CREATININE 7.36* 6.90* 8.58*  CALCIUM 9.5 8.9 9.1  PHOS  --   --  5.4*    Recent Labs Lab 01/09/15 0325 01/11/15 0353  AST 16  --   ALT 13*  --   ALKPHOS 105  --   BILITOT 0.8  --   PROT 6.1*  --   ALBUMIN 3.5 3.4*    Recent Labs Lab 01/08/15 0807 01/08/15 1330 01/09/15 0325 01/09/15 1425  WBC 6.3  --  7.7 9.7  NEUTROABS 4.6  --   --   --   HGB 10.9* 12.6* 10.7* 10.6*  HCT 33.7* 37.0* 33.7* 33.2*  MCV 96.3  --  99.1 97.9  PLT 107*  --  90* 103*   . allopurinol  150 mg Oral Daily  . amLODipine  10 mg Oral Daily  . aspirin EC  81 mg Oral Daily  . calcium carbonate  2 tablet Oral TID WC  . insulin aspart  0-5 Units Subcutaneous QHS  . insulin  aspart  0-9 Units Subcutaneous TID WC  . insulin aspart protamine- aspart  30 Units Subcutaneous Q breakfast  . loratadine  10 mg Oral Daily  . multivitamin  1 tablet Oral QHS  . pantoprazole  40 mg Oral Daily  . polyethylene glycol  17 g Oral Once per day on Sun Tue Thu Sat  . sevelamer carbonate  1,600 mg Oral TID WC  . sodium chloride  3 mL Intravenous Q12H   . DOPamine Stopped (01/08/15 1500)   sodium chloride, sodium chloride, sodium chloride, acetaminophen, ALPRAZolam, feeding supplement (NEPRO CARB STEADY), heparin, heparin, hydrALAZINE, lidocaine (PF), lidocaine-prilocaine, nitroGLYCERIN, ondansetron **OR** ondansetron (ZOFRAN) IV, pentafluoroprop-tetrafluoroeth, sodium chloride, sodium chloride, zolpidem

## 2015-01-11 NOTE — Progress Notes (Signed)
SATURATION QUALIFICATIONS: (This note is used to comply with regulatory documentation for home oxygen)  Patient Saturations on Room Air at Rest = 89%  Patient Saturations on Room Air while Ambulating = 88%  Patient Saturations on 2 Liters of oxygen while Ambulating = 95 %  Ferdinand Lango, RN

## 2015-01-11 NOTE — Care Management Note (Signed)
Case Management Note  Patient Details  Name: Antonio Page MRN: [id number] Date of Birth: Aug 20, 1941  Subjective/Objective: Pt admitted for SOB and syncope. Hx ESRD.                  Action/Plan: CM did speak with pt and wife in regards to [facility] services and DME needs. Pt states he would benefit from HHPT services due to balance issues. CM did offer home safety evaluation and treatment session. CM did offer choice and pt chose [facility]. Pt has DME CPAP via [place] now. Pt also qualifies for home 02. Will need orders for home 02 and PT with f6f. No further needs identified by CM at this time.    Expected Discharge Date:  01/11/15               Expected Discharge Plan:  [place]  In-House Referral:     Discharge planning Services  CM Consult  Post Acute Care Choice:  Durable Medical Equipment Choice offered to:     DME Arranged:  Oxygen DME Agency:  [place] Arranged:  PT (Home safety Evaluation and treatment) [place] Agency:  [place]  Status of Service:  Completed, signed off  Medicare Important Message Given:  Yes Date Medicare IM Given:  01/11/15 Medicare IM give by:  Jacqlyn Krauss, RN,BSN Care Manager Date Additional Medicare IM Given:    Additional Medicare Important Message give by:     If discussed at [place] of Stay Meetings, dates discussed:    Additional Comments:  Bethena Roys, RN 01/11/2015, 11:06 AM

## 2015-01-11 NOTE — Discharge Summary (Signed)
Physician Discharge Summary      Patient ID: Antonio Page MRN: [id number] DOB/AGE: 1941/01/09 74 y.o.  Admit date: 01/08/2015 Discharge date: 01/12/2015   Primary Cardiologist:Dr. Gwenlyn Found   Discharge Diagnoses:  Principal Problem:   Syncope Active Problems:   Morbid obesity   OSA (obstructive sleep apnea)   Diabetes mellitus type 2, insulin dependent   Uncontrolled hypertension   Coronary artery disease   End stage renal disease on dialysis   Sarcoidosis of lung   Anemia in chronic kidney disease   Complete heart block, temp pacemaker, s/p PPM MDT dual chamber 5/4/1t6   Hyperkalemia   Hypoxia, now with home oxygen   Discharged Condition: good  Procedures: 01/08/15 temp pacer wire by Dr. Sallyanne Kuster  01/10/15 PPM by Dr. Caryl Comes [employer] device dual chamber.  Hospital Course: 74 yo male w/ hx sarcoid, CAD w/ RCA stent '99, ESRD on HD M/W/Fri, nl LV, HTN, DM and OSA on CPAP, who came to the ER with multiple episodes of syncope 01/08/15.  He was having coffee with wife Took a few sips Then wife says his eyes rolled back in head Not responsive. Patient doesn't remember much about this but was not having chest pain, SOB, or palpitations.  When responsive, he just didn't feel right Was supposed to go to dialysis but came to ER instead. In admitting area was in chair Wife said did it again Seizurelike activity this time. Admitted.   Later on telemetry, he had a 9 second ventricular pause with P waves. External pacing pads were placed.  He has had other pauses, dropped 2-3 QRS complexes, but did not lose consciousness with these.  Previously has syncope at [facility] as well.   He has chronic fatigue On dialysis. Chronic intermitent CP that is pleuritic, worse with cough, chest wall tender. Mild now. Cause not clear. Dr Gwenlyn Found has been aware.  Hx of pulmonary sarcoid for years Followed by Dr Gwenette Greet. Has not been bad. with intermittent 3 rd degree AV Block pt underwent T. Pacer.  Pacing  threshold confirmed at 0.8 mV.  Also with hyperkalemia and with dialysis this resolved.    Pt overall with improved HR but after long discussion with Dr. Caryl Comes and Nephrology "Pt has bifasicular block and the concern from nephrology is that there is no good and reliable way to protect him from K levels 5.8-6.2 So IF they were responsible this is not a reversible trirgger and at that level, they may have not been contributory anyway and that the CHB is more closely related to the underlying conduction disease than the K " per Dr. Caryl Comes.  Plans were made to proceed with PPM.   Pt and family agreeable.    01/10/15 placement of MDT PPM dual chamber without complications.  Pt also had dialysis on 01/10/15.  Follow up CXR without pneumothorax. Pacer interrogated with out issues.   He will resume HD on Monday at usual center.    Prior to discharge with activity he would de Sat to 88% on RA and with oxygen it would come back up.  We prepared him for discharge with oxygen but believe with another day in hospital for activity he will not need home oxygen.   Also ordered home PT.    V pacing at discharge.   Pt seen and evaluated by Dr. Caryl Comes.   Consults: cardiology and nephrology and IM  Significant Diagnostic Studies:  BMP Latest Ref Rng 01/11/2015 01/10/2015 01/09/2015  Glucose 70 - 99 mg/dL 149(H) 146(H)  99  BUN 6 - 20 mg/dL 46(H) 33(H) 35(H)  Creatinine 0.61 - 1.24 mg/dL 8.58(H) 6.90(H) 7.36(H)  Sodium 135 - 145 mmol/L 140 141 143  Potassium 3.5 - 5.1 mmol/L 4.6 4.4 4.7  Chloride 101 - 111 mmol/L 102 102 101  CO2 22 - 32 mmol/L 27 26 30   Calcium 8.9 - 10.3 mg/dL 9.1 8.9 9.5   CBC Latest Ref Rng 01/09/2015 01/09/2015 01/08/2015  WBC 4.0 - 10.5 K/uL 9.7 7.7 -  Hemoglobin 13.0 - 17.0 g/dL 10.6(L) 10.7(L) 12.6(L)  Hematocrit 39.0 - 52.0 % 33.2(L) 33.7(L) 37.0(L)  Platelets 150 - 400 K/uL 103(L) 90(L) -   Troponin 0.03 negative.  HGBA1C 6.0  TSH  3.422  CT angio: IMPRESSION: *Negative for pulmonary  embolism *Interstitial and alveolar edema consistent with congestive heart failure. Very small pleural effusions bilaterally. *Extensive calcified coronary artery plaque.  CHEST 2 VIEW COMPARISON: CTA chest dated 01/08/2015 FINDINGS: Mild patchy left lower lobe opacity, likely atelectasis. Suspected small left pleural effusion. No pneumothorax. Cardiomegaly.  Left subclavian pacemaker in satisfactory position.  Degenerative changes of the visualized thoracolumbar spine.  IMPRESSION: Left subclavian pacemaker in satisfactory position.  Mild patchy left lower lobe opacity, likely atelectasis.  Suspected small left pleural effusion. No pneumothorax.   Discharge Exam: Blood pressure 116/46, pulse 64, temperature 97.8 F (36.6 C), temperature source Oral, resp. rate 14, height 5\' 11"  (1.803 m), weight 272 lb 3.2 oz (123.469 kg), SpO2 97 %.    Disposition: 01-Home or Self Care      Discharge Instructions    Diet - low sodium heart healthy    Complete by:  As directed      Face-to-face encounter (required for Medicare/Medicaid patients)    Complete by:  As directed   I [facility] certify that this patient is under my care and that I, or a nurse practitioner or physician's assistant working with me, had a face-to-face encounter that meets the physician face-to-face encounter requirements with this patient on 01/11/2015. The encounter with the patient was in whole, or in part for the following medical condition(s) which is the primary reason for home health care (List medical condition): admitted with syncope and severe symptomatic bradycardia.  Underwent PPM and cannot ambulate as well due to drop in sp02 with ambulation to 88% on room air and improves with oxygen.  Healing incision.  Weakness from illness. Also diabetic.  The encounter with the patient was in whole, or in part, for the following medical condition, which is the primary reason for home health care:   syncope s/p PPM, hypoxia, OSA, CAD, DM2  I certify that, based on my findings, the following services are medically necessary home health services:  Physical therapy  Reason for Medically Necessary Home Health Services:   Skilled Nursing- Skilled Assessment/Observation Skilled Nursing- Change/Decline in Patient Status Therapy- Personnel officer, Public librarian Therapy- Home Adaptation to Facilitate Safety Therapy- Therapeutic Exercises to Increase Strength and Endurance    My clinical findings support the need for the above services:   Shortness of breath with activity Unable to leave home safely without assistance and/or assistive device    Further, I certify that my clinical findings support that this patient is homebound due to:   Shortness of Breath with activity Ambulates short distances less than 300 feet       For home use only DME oxygen    Complete by:  As directed   Mode or (Route):  Nasal cannula  Liters  per Minute:  2  Frequency:  Continuous (stationary and portable oxygen unit needed)  Oxygen conserving device:  Yes  Oxygen delivery system:  Gas     Home Health    Complete by:  As directed   To provide the following care/treatments:  PT     Increase activity slowly    Complete by:  As directed             Medication List    STOP taking these medications        losartan 100 MG tablet  Commonly known as:  COZAAR      TAKE these medications        allopurinol 300 MG tablet  Commonly known as:  ZYLOPRIM  Take 150 mg by mouth daily.     amLODipine 10 MG tablet  Commonly known as:  NORVASC  Take 10 mg by mouth daily.     aspirin EC 81 MG tablet  Take 81 mg by mouth daily.     b complex-vitamin c-folic acid 0.8 MG Tabs tablet  Take 0.8 mg by mouth daily.     calcium carbonate 500 MG chewable tablet  Commonly known as:  TUMS - dosed in mg elemental calcium  Chew 2 tablets by mouth 3 (three) times daily with meals.     cinacalcet 30 MG  tablet  Commonly known as:  SENSIPAR  Take 30 mg by mouth daily with supper.     fexofenadine 180 MG tablet  Commonly known as:  ALLEGRA  Take 180 mg by mouth daily.     insulin NPH-regular Human (70-30) 100 UNIT/ML injection  Commonly known as:  NOVOLIN 70/30  Inject 40 Units into the skin daily with breakfast.     nitroGLYCERIN 0.4 MG SL tablet  Commonly known as:  NITROSTAT  Place 1 tablet (0.4 mg total) under the tongue every 5 (five) minutes x 3 doses as needed for chest pain.     omeprazole 20 MG capsule  Commonly known as:  PRILOSEC  Take 40 mg by mouth daily.     polyethylene glycol packet  Commonly known as:  MIRALAX / GLYCOLAX  Take 17 g by mouth daily. Does not take on days of dialysis.     RENVELA 800 MG tablet  Generic drug:  sevelamer carbonate  Take 1,600 mg by mouth 3 (three) times daily with meals. 2 each meal     sodium chloride 0.65 % Soln nasal spray  Commonly known as:  OCEAN  Place 1 spray into the nose daily as needed for congestion.       Follow-up Information    Follow up with [facility].   Why:  Physical Therapy   Contact information:   [address] [place] [address] [phone]       Follow up with [facility].   Why:  oxygen   Contact information:   [address] [place] [address] [phone]       Follow up with [facility] On 01/24/2015.   Specialty:  Cardiology   Why:  at 2:30 pm    Contact information:   [address] [place] [phone]      Follow up with Virl Axe, MD On 04/13/2015.   Specialty:  Cardiology   Why:  at 11:30 AM   Contact information:   [address] [place] [address]  [phone]       Follow up with Quay Burow, MD On 02/19/2015.   Specialty:  Cardiology   Why:  at 10:30 Am with his nurse practitioner Cecilie Kicks.     Contact information:   [address] [place] [address] [phone]        Discharge Instructions:Heart Healthy diabetic diet  Pacemaker instruction sheet.   SignedTarri Fuller  [facility]: [facility] 01/12/2015, 10:34 AM  Time spent on discharge :>35 minutes.

## 2015-01-11 NOTE — Discharge Instructions (Signed)
° ° °  Supplemental Discharge Instructions for  Pacemaker Patients  Activity No heavy lifting or vigorous activity with your left/right arm for 6 to 8 weeks.  Do not raise your left arm above your head for one week.  Gradually raise your affected arm as drawn below.           __5/5/16                           01/13/15                     01/15/15                       01/17/15  NO DRIVING for  1 week    ; you may begin driving on 7/86/75  If you drive.  WOUND CARE - Keep the wound area clean and dry.  Do not get this area wet for one week. No showers for one week; you may shower on  01/17/15   . - The tape/steri-strips on your wound will fall off; do not pull them off.  No bandage is needed on the site.  DO  NOT apply any creams, oils, or ointments to the wound area. - If you notice any drainage or discharge from the wound, any swelling or bruising at the site, or you develop a fever > 101? F after you are discharged home, call the office at once.  Special Instructions - You are still able to use cellular telephones; use the ear opposite the side where you have your pacemaker/defibrillator.  Avoid carrying your cellular phone near your device. - When traveling through airports, show security personnel your identification card to avoid being screened in the metal detectors.  Ask the security personnel to use the hand wand. - Avoid arc welding equipment, MRI testing (magnetic resonance imaging), TENS units (transcutaneous nerve stimulators).  Call the office for questions about other devices. - Avoid electrical appliances that are in poor condition or are not properly grounded. - Microwave ovens are safe to be near or to operate.  Heart Healthy Diabetic diet.

## 2015-01-11 NOTE — Progress Notes (Signed)
Patient Name: Antonio Page      SUBJECTIVE: without complaint  No cpain or sob  Past Medical History  Diagnosis Date  . Hypertension   . Sleep apnea     uses a cpap  . Diabetes mellitus   . GERD (gastroesophageal reflux disease)   . Arthritis   . Glaucoma   . Dialysis patient   . Blind right eye   . Chronic kidney disease     dialysis  . Kidney stones   . Coronary artery disease     Scheduled Meds:  Scheduled Meds: . allopurinol  150 mg Oral Daily  . amLODipine  10 mg Oral Daily  . aspirin EC  81 mg Oral Daily  . calcium carbonate  2 tablet Oral TID WC  . insulin aspart  0-5 Units Subcutaneous QHS  . insulin aspart  0-9 Units Subcutaneous TID WC  . insulin aspart protamine- aspart  30 Units Subcutaneous Q breakfast  . loratadine  10 mg Oral Daily  . multivitamin  1 tablet Oral QHS  . pantoprazole  40 mg Oral Daily  . polyethylene glycol  17 g Oral Once per day on Sun Tue Thu Sat  . sevelamer carbonate  1,600 mg Oral TID WC  . sodium chloride  3 mL Intravenous Q12H   Continuous Infusions: . DOPamine Stopped (01/08/15 1500)   sodium chloride, sodium chloride, sodium chloride, acetaminophen, ALPRAZolam, feeding supplement (NEPRO CARB STEADY), heparin, heparin, hydrALAZINE, lidocaine (PF), lidocaine-prilocaine, nitroGLYCERIN, ondansetron **OR** ondansetron (ZOFRAN) IV, pentafluoroprop-tetrafluoroeth, sodium chloride, sodium chloride, zolpidem    PHYSICAL EXAM Filed Vitals:   01/10/15 2050 01/10/15 2300 01/10/15 2342 01/11/15 0421  BP:   131/26 155/44  Pulse: 66 65 65 60  Temp:   98.8 F (37.1 C) 98.4 F (36.9 C)  TempSrc:   Oral Oral  Resp: 19 19 20 15   Height:      Weight:    272 lb 11.2 oz (123.696 kg)  SpO2: 96%   97%    Well developed and nourished in no acute distress HENT normal blind r eye Neck supple   Few crackles Pocket without hematoma Regular rate and rhythm, no murmurs or gallops Abd-soft with active BS No Clubbing cyanosis  edema Skin-warm and dry A & Oriented  Grossly normal sensory and motor function   TELEMETRY: Reviewed telemetry pt in  occ v pacing   Intake/Output Summary (Last 24 hours) at 01/11/15 0750 Last data filed at 01/10/15 1700  Gross per 24 hour  Intake    482 ml  Output   1100 ml  Net   -618 ml    LABS: Basic Metabolic Panel:  Recent Labs Lab 01/08/15 0807 01/08/15 1242 01/08/15 1330 01/08/15 1730 01/09/15 0325 01/10/15 1520 01/11/15 0353  NA 143  --  141  --  143 141 140  K 5.5* 6.0* 6.2*  --  4.7 4.4 4.6  CL 101  --  105  --  101 102 102  CO2 26  --   --   --  30 26 27   GLUCOSE 153*  --  175*  --  99 146* 149*  BUN 60*  --  65*  --  35* 33* 46*  CREATININE 9.91*  --  9.40*  --  7.36* 6.90* 8.58*  CALCIUM 8.9  --   --   --  9.5 8.9 9.1  MG  --   --   --  2.0  --   --   --  PHOS  --   --   --   --   --   --  5.4*   Cardiac Enzymes:  Recent Labs  01/08/15 0807  TROPONINI 0.03   CBC:  Recent Labs Lab 01/08/15 0807 01/08/15 1330 01/09/15 0325 01/09/15 1425  WBC 6.3  --  7.7 9.7  NEUTROABS 4.6  --   --   --   HGB 10.9* 12.6* 10.7* 10.6*  HCT 33.7* 37.0* 33.7* 33.2*  MCV 96.3  --  99.1 97.9  PLT 107*  --  90* 103*   PROTIME: No results for input(s): LABPROT, INR in the last 72 hours. Liver Function Tests:  Recent Labs  01/09/15 0325 01/11/15 0353  AST 16  --   ALT 13*  --   ALKPHOS 105  --   BILITOT 0.8  --   PROT 6.1*  --   ALBUMIN 3.5 3.4*   No results for input(s): LIPASE, AMYLASE in the last 72 hours. BNP: BNP (last 3 results) No results for input(s): BNP in the last 8760 hours.  ProBNP (last 3 results) No results for input(s): PROBNP in the last 8760 hours.  D-Dimer: No results for input(s): DDIMER in the last 72 hours. Hemoglobin A1C:  Recent Labs  01/08/15 1242  HGBA1C 6.0*   Fasting Lipid Panel: No results for input(s): CHOL, HDL, LDLCALC, TRIG, CHOLHDL, LDLDIRECT in the last 72 hours. Thyroid Function Tests:  Recent  Labs  01/08/15 1730  TSH 3.422   Anemia Panel: No results for input(s): VITAMINB12, FOLATE, FERRITIN, TIBC, IRON, RETICCTPCT in the last 72 hours.   Device Interrogation normal device function w some v pacing   ASSESSMENT AND PLAN:  Principal Problem:   Syncope Active Problems:   Morbid obesity   OSA (obstructive sleep apnea)   Diabetes mellitus type 2, insulin dependent   Uncontrolled hypertension   Coronary artery disease   End stage renal disease on dialysis   Sarcoidosis of lung   Anemia in chronic kidney disease   Complete heart block  Device function normal Instructions given Incentive spirometyr Blood pressure much better today  Home today if weans off O2 Wound check 10 days SK 3 month  Signed, Virl Axe MD  01/11/2015

## 2015-01-12 LAB — GLUCOSE, CAPILLARY
GLUCOSE-CAPILLARY: 133 mg/dL — AB (ref 70–99)
Glucose-Capillary: 120 mg/dL — ABNORMAL HIGH (ref 70–99)

## 2015-01-12 MED ORDER — ONDANSETRON HCL 4 MG/2ML IJ SOLN
INTRAMUSCULAR | Status: AC
  Filled 2015-01-12: qty 2

## 2015-01-12 MED ORDER — NITROGLYCERIN 0.4 MG SL SUBL: 0.4000 mg | SUBLINGUAL_TABLET | SUBLINGUAL | Status: AC | PRN

## 2015-01-12 NOTE — Progress Notes (Signed)
Patient Name: Antonio Page      SUBJECTIVE: without complaint  No chest pain or sob  Some desaturation yesterday with ambulation  But looks good this am witout O2  On HD  Past Medical History  Diagnosis Date  . Hypertension   . Sleep apnea     uses a cpap  . Diabetes mellitus   . GERD (gastroesophageal reflux disease)   . Arthritis   . Glaucoma   . Dialysis patient   . Blind right eye   . Chronic kidney disease     dialysis  . Kidney stones   . Coronary artery disease     hx of stent to RCA  . Hypoxia 01/11/2015  . CHB (complete heart block) 01/08/15    PPM placed MDT    Scheduled Meds:  Scheduled Meds: . allopurinol  150 mg Oral Daily  . amLODipine  10 mg Oral Daily  . aspirin EC  81 mg Oral Daily  . calcium carbonate  2 tablet Oral TID WC  . insulin aspart  0-5 Units Subcutaneous QHS  . insulin aspart  0-9 Units Subcutaneous TID WC  . insulin aspart protamine- aspart  30 Units Subcutaneous Q breakfast  . loratadine  10 mg Oral Daily  . multivitamin  1 tablet Oral QHS  . pantoprazole  40 mg Oral Daily  . polyethylene glycol  17 g Oral Once per day on Sun Tue Thu Sat  . sevelamer carbonate  1,600 mg Oral TID WC  . sodium chloride  3 mL Intravenous Q12H   Continuous Infusions: . DOPamine Stopped (01/08/15 1500)   sodium chloride, sodium chloride, sodium chloride, acetaminophen, ALPRAZolam, feeding supplement (NEPRO CARB STEADY), feeding supplement (NEPRO CARB STEADY), heparin, heparin, hydrALAZINE, lidocaine (PF), lidocaine (PF), lidocaine-prilocaine, nitroGLYCERIN, ondansetron **OR** ondansetron (ZOFRAN) IV, pentafluoroprop-tetrafluoroeth, pentafluoroprop-tetrafluoroeth, sodium chloride, sodium chloride, zolpidem    PHYSICAL EXAM Filed Vitals:   01/11/15 2026 01/11/15 2259 01/12/15 0018 01/12/15 0444  BP: 164/38  153/31 147/49  Pulse: 66 94 59 60  Temp: 98.6 F (37 C)  98.9 F (37.2 C) 98.4 F (36.9 C)  TempSrc: Oral  Oral Oral  Resp: 18  18 18     Height:    5\' 11"  (1.803 m)  Weight:    272 lb 3.2 oz (123.469 kg)  SpO2: 94% 99% 92% 100%    Well developed and nourished in no acute distress HENT normal blind r eye Neck supple   Few crackles Pocket without hematoma Regular rate and rhythm, no murmurs or gallops Abd-soft with active BS No Clubbing cyanosis edema Skin-warm and dry A & Oriented  Grossly normal sensory and motor function   TELEMETRY: Reviewed telemetry pt in  occ v pacing   Intake/Output Summary (Last 24 hours) at 01/12/15 0712 Last data filed at 01/11/15 2127  Gross per 24 hour  Intake    340 ml  Output      0 ml  Net    340 ml    LABS: Basic Metabolic Panel:  Recent Labs Lab 01/08/15 0807 01/08/15 1242 01/08/15 1330 01/08/15 1730 01/09/15 0325 01/10/15 1520 01/11/15 0353  NA 143  --  141  --  143 141 140  K 5.5* 6.0* 6.2*  --  4.7 4.4 4.6  CL 101  --  105  --  101 102 102  CO2 26  --   --   --  30 26 27   GLUCOSE 153*  --  175*  --  99 146* 149*  BUN 60*  --  65*  --  35* 33* 46*  CREATININE 9.91*  --  9.40*  --  7.36* 6.90* 8.58*  CALCIUM 8.9  --   --   --  9.5 8.9 9.1  MG  --   --   --  2.0  --   --   --   PHOS  --   --   --   --   --   --  5.4*   Cardiac Enzymes: No results for input(s): CKTOTAL, CKMB, CKMBINDEX, TROPONINI in the last 72 hours. CBC:  Recent Labs Lab 01/08/15 0807 01/08/15 1330 01/09/15 0325 01/09/15 1425  WBC 6.3  --  7.7 9.7  NEUTROABS 4.6  --   --   --   HGB 10.9* 12.6* 10.7* 10.6*  HCT 33.7* 37.0* 33.7* 33.2*  MCV 96.3  --  99.1 97.9  PLT 107*  --  90* 103*   PROTIME: No results for input(s): LABPROT, INR in the last 72 hours. Liver Function Tests:  Recent Labs  01/11/15 0353  ALBUMIN 3.4*   No results for input(s): LIPASE, AMYLASE in the last 72 hours. BNP: BNP (last 3 results) No results for input(s): BNP in the last 8760 hours.  CXR some atelecatisis  Device Interrogation normal device function w some v pacing   ASSESSMENT AND  PLAN:  Principal Problem:   Syncope Active Problems:   Morbid obesity   OSA (obstructive sleep apnea)   Diabetes mellitus type 2, insulin dependent   Uncontrolled hypertension   Coronary artery disease   End stage renal disease on dialysis   Sarcoidosis of lung   Anemia in chronic kidney disease   Complete heart block, temp pacemaker, s/p PPM MDT dual chamber 5/4/1t6   Hyperkalemia   Hypoxia, now with home oxygen  Device function normal Instructions given   Blood pressure much better   Home today; will send w  O2 if necessary but looks great and supsect not Wound check 10 days SK 3 month  Signed, Virl Axe MD  01/12/2015

## 2015-01-12 NOTE — Progress Notes (Signed)
  [facility] Progress Note   Subjective: no complaints  Filed Vitals:   01/12/15 0729 01/12/15 0737 01/12/15 0742 01/12/15 0800  BP: 172/84 173/76 165/80 143/69  Pulse: 62 61 62 63  Temp: 97.8 F (36.6 C)     TempSrc: Oral     Resp: 14     Height:      Weight:      SpO2: 97%      Exam: Alert, lying flat, no distress No jvd Chest clear bilat RRR no MRG Abd obese, ntnd, +BS, no mass Trace pretib edema Neuro is alert and Ox 3  HD: [facility] 4h 29min 126kg 2/2 bath Heparin 4000  Mircera 100/ 2 wks last 4-20 Calcitriol 0.5 tiw w hd   Assessment: 1. Syncope / complete heart block - s/p PPM 5/4 2. ESRD on HD MWF 3. Pulm edema/ ^vol - better, new dry wt will be around 121-122 kg 4. Hyperkalemia - resolved 5. HTN on norvasc, ARB on hold 6. 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 Gout 9. DIspo - possible dc today per primary  Plan - HD this am, prob dc after HD today    Kelly Splinter MD  pager [phone]    cell [phone]  01/12/2015, 8:06 AM     Recent Labs Lab 01/09/15 0325 01/10/15 1520 01/11/15 0353  NA 143 141 140  K 4.7 4.4 4.6  CL 101 102 102  CO2 30 26 27   GLUCOSE 99 146* 149*  BUN 35* 33* 46*  CREATININE 7.36* 6.90* 8.58*  CALCIUM 9.5 8.9 9.1  PHOS  --   --  5.4*    Recent Labs Lab 01/09/15 0325 01/11/15 0353  AST 16  --   ALT 13*  --   ALKPHOS 105  --   BILITOT 0.8  --   PROT 6.1*  --   ALBUMIN 3.5 3.4*    Recent Labs Lab 01/08/15 0807 01/08/15 1330 01/09/15 0325 01/09/15 1425  WBC 6.3  --  7.7 9.7  NEUTROABS 4.6  --   --   --   HGB 10.9* 12.6* 10.7* 10.6*  HCT 33.7* 37.0* 33.7* 33.2*  MCV 96.3  --  99.1 97.9  PLT 107*  --  90* 103*   . allopurinol  150 mg Oral Daily  . amLODipine  10 mg Oral Daily  . aspirin EC  81 mg Oral Daily  . calcium carbonate  2 tablet Oral TID WC  . insulin aspart  0-5 Units Subcutaneous QHS  . insulin aspart  0-9 Units Subcutaneous TID WC  . insulin aspart protamine- aspart  30  Units Subcutaneous Q breakfast  . loratadine  10 mg Oral Daily  . multivitamin  1 tablet Oral QHS  . pantoprazole  40 mg Oral Daily  . polyethylene glycol  17 g Oral Once per day on Sun Tue Thu Sat  . sevelamer carbonate  1,600 mg Oral TID WC  . sodium chloride  3 mL Intravenous Q12H   . DOPamine Stopped (01/08/15 1500)   sodium chloride, sodium chloride, sodium chloride, acetaminophen, ALPRAZolam, feeding supplement (NEPRO CARB STEADY), feeding supplement (NEPRO CARB STEADY), heparin, heparin, hydrALAZINE, lidocaine (PF), lidocaine (PF), lidocaine-prilocaine, nitroGLYCERIN, ondansetron **OR** ondansetron (ZOFRAN) IV, pentafluoroprop-tetrafluoroeth, pentafluoroprop-tetrafluoroeth, sodium chloride, sodium chloride, zolpidem

## 2015-01-16 MED FILL — Lidocaine HCl Local Preservative Free (PF) Inj 1%: INTRAMUSCULAR | Qty: 30 | Status: AC

## 2015-01-18 ENCOUNTER — Telehealth: Payer: Self-pay | Admitting: Internal Medicine

## 2015-01-18 NOTE — Telephone Encounter (Signed)
New Message       Pt's wife calling stating that pt had a pacemaker implanted last week and his d/c instructions state he could take a shower as of yesterday. Pt's wife states that his big bandage is still on and they are unsure as to whether they should take that off so he can shower or leave it on and him not shower yet. Please call back and advise.

## 2015-01-18 NOTE — Telephone Encounter (Signed)
Spoke with Ms. Cleckley- reiterated to take off large bandage but leave steristrips in place until wound check 01/24/15. Pt ok to shower. She verbalizes understanding.

## 2015-01-24 ENCOUNTER — Encounter: Payer: Self-pay | Admitting: Internal Medicine

## 2015-01-24 ENCOUNTER — Ambulatory Visit (INDEPENDENT_AMBULATORY_CARE_PROVIDER_SITE_OTHER): Payer: Medicare Other | Admitting: *Deleted

## 2015-01-24 DIAGNOSIS — Z950 Presence of cardiac pacemaker: Secondary | ICD-10-CM

## 2015-01-24 DIAGNOSIS — I442 Atrioventricular block, complete: Secondary | ICD-10-CM

## 2015-01-24 LAB — CUP PACEART INCLINIC DEVICE CHECK
Battery Remaining Longevity: 110 mo
Brady Statistic AP VP Percent: 69 %
Brady Statistic AP VS Percent: 1 %
Brady Statistic AS VS Percent: 14 %
Lead Channel Impedance Value: 612 Ohm
Lead Channel Pacing Threshold Amplitude: 0.5 V
Lead Channel Sensing Intrinsic Amplitude: 22.4 mV
Lead Channel Setting Pacing Amplitude: 3.5 V
Lead Channel Setting Pacing Pulse Width: 0.4 ms
Lead Channel Setting Sensing Sensitivity: 5.6 mV
MDC IDC MSMT BATTERY IMPEDANCE: 100 Ohm
MDC IDC MSMT BATTERY VOLTAGE: 2.78 V
MDC IDC MSMT LEADCHNL RA IMPEDANCE VALUE: 460 Ohm
MDC IDC MSMT LEADCHNL RA PACING THRESHOLD AMPLITUDE: 0.75 V
MDC IDC MSMT LEADCHNL RA PACING THRESHOLD PULSEWIDTH: 0.4 ms
MDC IDC MSMT LEADCHNL RA SENSING INTR AMPL: 4 mV
MDC IDC MSMT LEADCHNL RV PACING THRESHOLD PULSEWIDTH: 0.4 ms
MDC IDC SESS DTM: 20160518145709
MDC IDC SET LEADCHNL RA PACING AMPLITUDE: 3.5 V
MDC IDC STAT BRADY AS VP PERCENT: 15 %

## 2015-01-24 NOTE — Progress Notes (Signed)
Wound check appointment. Dermabond removed by pt prior to arrival. Wound without redness or edema. Incision edges approximated, wound well healed. Normal device function. Thresholds, sensing, and impedances consistent with implant measurements. Device programmed at 3.5V/auto capture programmed on for extra safety margin until 3 month visit. Histogram distribution blunted---turned on sensor. 3 mode switches--- <0.1%. No high ventricular rates noted. Patient educated about wound care, arm mobility, lifting restrictions. ROV w/ SK 04/13/15.

## 2015-01-29 ENCOUNTER — Telehealth: Payer: Self-pay | Admitting: Cardiovascular Disease

## 2015-01-29 NOTE — Telephone Encounter (Signed)
Agree .. Try to get him in to flex clinic sooner than 6/13.   DR. Lemmie Evens

## 2015-01-29 NOTE — Telephone Encounter (Signed)
[name] is calling because Antonio Page is having some chest pains with activity and blood pressure is starting to go up and he states that he is just not feeling as well as when he came out of the hospital on 01/12/15.Marland Kitchen Please call.  Thanks

## 2015-01-29 NOTE — Telephone Encounter (Addendum)
Pt reports chest pain, not active at this time. He had 2 episodes over the weekend, each resolved w/ nitro x1.  He had dyspnea w/ this, which resolved when he rested. Also notes lightheadedness. Unsure of BPs.  CP occurred w/ activity, to him it felt like GERD. Distinctly in chest but describes as sore, not sharp.  Recently hospitalized - PM insertion d/t complete HB. Recent device check OK.  Informed patient to report to ED if pain comes back/unresolved w/ nitro, & that I would reach out to [facility] to see if flex visit possible.   Will route to [employer] for additional advice.

## 2015-01-30 ENCOUNTER — Telehealth: Payer: Self-pay | Admitting: *Deleted

## 2015-01-30 ENCOUNTER — Ambulatory Visit (INDEPENDENT_AMBULATORY_CARE_PROVIDER_SITE_OTHER): Payer: Medicare Other | Admitting: *Deleted

## 2015-01-30 ENCOUNTER — Encounter: Payer: Self-pay | Admitting: Physician Assistant

## 2015-01-30 ENCOUNTER — Ambulatory Visit (INDEPENDENT_AMBULATORY_CARE_PROVIDER_SITE_OTHER): Payer: Medicare Other | Admitting: Physician Assistant

## 2015-01-30 VITALS — BP 152/52 | HR 66 | Ht 71.0 in | Wt 271.0 lb

## 2015-01-30 DIAGNOSIS — R079 Chest pain, unspecified: Secondary | ICD-10-CM

## 2015-01-30 DIAGNOSIS — I25119 Atherosclerotic heart disease of native coronary artery with unspecified angina pectoris: Secondary | ICD-10-CM | POA: Diagnosis not present

## 2015-01-30 DIAGNOSIS — I442 Atrioventricular block, complete: Secondary | ICD-10-CM

## 2015-01-30 DIAGNOSIS — I2510 Atherosclerotic heart disease of native coronary artery without angina pectoris: Secondary | ICD-10-CM | POA: Diagnosis not present

## 2015-01-30 DIAGNOSIS — I10 Essential (primary) hypertension: Secondary | ICD-10-CM

## 2015-01-30 DIAGNOSIS — N186 End stage renal disease: Secondary | ICD-10-CM

## 2015-01-30 DIAGNOSIS — Z950 Presence of cardiac pacemaker: Secondary | ICD-10-CM

## 2015-01-30 LAB — CUP PACEART INCLINIC DEVICE CHECK
Battery Voltage: 2.79 V
Brady Statistic AP VP Percent: 97 %
Brady Statistic AS VS Percent: 0 %
Lead Channel Impedance Value: 493 Ohm
Lead Channel Impedance Value: 637 Ohm
Lead Channel Pacing Threshold Pulse Width: 0.4 ms
Lead Channel Pacing Threshold Pulse Width: 0.4 ms
Lead Channel Setting Pacing Amplitude: 3.5 V
Lead Channel Setting Pacing Pulse Width: 0.4 ms
Lead Channel Setting Sensing Sensitivity: 5.6 mV
MDC IDC MSMT BATTERY IMPEDANCE: 100 Ohm
MDC IDC MSMT BATTERY REMAINING LONGEVITY: 100 mo
MDC IDC MSMT LEADCHNL RA PACING THRESHOLD AMPLITUDE: 1 V
MDC IDC MSMT LEADCHNL RA SENSING INTR AMPL: 4 mV
MDC IDC MSMT LEADCHNL RV PACING THRESHOLD AMPLITUDE: 0.5 V
MDC IDC MSMT LEADCHNL RV SENSING INTR AMPL: 22.4 mV
MDC IDC SESS DTM: 20160524155734
MDC IDC SET LEADCHNL RV PACING AMPLITUDE: 3.5 V
MDC IDC STAT BRADY AP VS PERCENT: 0 %
MDC IDC STAT BRADY AS VP PERCENT: 3 %

## 2015-01-30 NOTE — Telephone Encounter (Signed)
Left message on home and cell phone for pt to call back. Pt scheduled on Flex schedule with Richardson Dopp, PA-C today at 3pm.

## 2015-01-30 NOTE — Telephone Encounter (Signed)
SIGNED ORDERS FOR PORTABLE O2 AND SUPPLIES FAXED TO [facility].

## 2015-01-30 NOTE — Patient Instructions (Signed)
Medication Instructions:  1. TAKE LOSARTAN 100 MG ON NON-DIALYSIS DAYS  Labwork: NONE  Testing/Procedures: Your physician has requested that you have a lexiscan myoview. For further information please visit [email or web address]. Please follow instruction sheet, as given.  Follow-Up: KEEP YOUR APPT WITH Cecilie Kicks, NP ON 6/13  Any Other Special Instructions Will Be Listed Below (If Applicable).

## 2015-01-30 NOTE — Telephone Encounter (Signed)
Attempted to reach patient to make sure they understood appt time. Spoke to wife, she was thankful for the call - somehow got confused and thought appt was scheduled for tomorrow. Verified appt time at 3pm today, at [facility], with Richardson Dopp. She voiced understanding in where office was and what time to be there today.

## 2015-01-30 NOTE — Progress Notes (Signed)
Pacemaker check in clinic- add on for dizziness, SOB, CP. Normal device function. Thresholds, sensing, impedances consistent with previous measurements. Device programmed to maximize longevity. 1 mode switch, <60 seconds. No high ventricular rates noted. Device programmed at appropriate safety margins. Histogram distribution appropriate for patient activity level-- RR turned on at last visit-- pt reports CP, SOB and fatigue since change. Per SK RR turned off. Device programmed to optimize intrinsic conduction. Estimated longevity 7-9.5 years. Patient enrolled in remote follow-up. ROV with SK in August.

## 2015-01-30 NOTE — Progress Notes (Signed)
Cardiology Office Note   Date:  01/30/2015   ID:  Antonio Page, DOB 1940-10-27, MRN [id number]  PCP:  Geoffery Lyons, MD  Cardiologist:  Dr. Quay Burow   Electrophysiologist:  Dr. Virl Axe    Chief Complaint  Patient presents with  . Chest Pain     History of Present Illness: Antonio Page is a 74 y.o. 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 Last seen by Dr. Gwenlyn Found in 2014  Admitted 5/2-5/6 after presenting to the emergency room with multiple episodes of syncope. He was noted to have a 9 second pause on telemetry. He was noted to be hyperkalemic with potassium of 6.2. He was found to have intermittent third-degree heart block. He underwent temporary pacemaker implantation and was seen by EP. He ultimately underwent dual-chamber pacemaker implantation.  Chest x-ray was without pneumothorax. He had some issues with hypoxia and was sent home on oxygen. He has had follow-up in the office already for initial wound check and pacemaker interrogation.  After discharge from the hospital, he was doing fairly well. However, he came in for his wound check last week. His device is set in DDD mode. Rate response was turned on at his visit last week. As soon as he left the office, he started to feel poorly. He had been to dialysis that day. He had been working with physical therapy at home after discharge without difficulty. Since last week, he's been having substernal chest burning with exercise. The symptoms are mild. He does feel it up in his jaw. He has had relief with nitroglycerin. He denies rest symptoms. He denies associated nausea, diaphoresis. He does have associated shortness of breath. He has felt better this morning. He denies syncope. He has had episodes of dizziness. He denies orthopnea. He sleeps with CPAP. He denies significant LE edema. He has been meeting his dry weight  at dialysis. His blood pressure does tend to run low after dialysis. He has not been taking his losartan consistently.   Studies/Reports Reviewed Today:  Echo 01/08/15 - Mild LVH. EF 55% to 60%.Wall motion was normal;  - Mitral valve: Calcified annulus. There was mild regurgitation. - Left atrium: The atrium was moderately dilated. - no aortic stenosis noted Impressions:- Incomplete study. Study was terminated secondary to periods ofasystole.  Myoview 12/26/08 No ischemia  Past Medical History  Diagnosis Date  . Hypertension   . Sleep apnea     uses a cpap  . Diabetes mellitus   . GERD (gastroesophageal reflux disease)   . Arthritis   . Glaucoma   . Dialysis patient   . Blind right eye   . Chronic kidney disease     dialysis  . Kidney stones   . Coronary artery disease     hx of stent to RCA  . Hypoxia 01/11/2015  . CHB (complete heart block) 01/08/15    PPM placed MDT    Past Surgical History  Procedure Laterality Date  . Dg av dialysis  shunt access exist*l* or  2010    left upper arm-not using  . Dg av dialysis  shunt access exist*l* or  2012    right upper arm-using this one  . Shoulder arthroscopy      right  . Knee arthroscopy      left  . Tonsillectomy    . Eye surgery      multiple  rt eye surgeries  . Coronary stent placement    . Trigger finger release  06/08/2012    Procedure: RELEASE TRIGGER FINGER/A-1 PULLEY;  Surgeon: Cammie Sickle., MD;  Location: [place];  Service: Orthopedics;  Laterality: Right;  . Dupuytren contracture release  06/08/2012    Procedure: DUPUYTREN CONTRACTURE RELEASE;  Surgeon: Cammie Sickle., MD;  Location: [place];  Service: Orthopedics;  Laterality: Right;  right ring fascia excision with A1 Pulley right ring  . Cystoscopy with retrograde pyelogram, ureteroscopy and stent placement  07/21/2012    Procedure: Naplate, URETEROSCOPY AND STENT PLACEMENT;  Surgeon:  Malka So, MD;  Location: [facility];  Service: Urology;  Laterality: Right;  . Mass excision  07/26/2012    Procedure: MINOR EXCISION OF MASS;  Surgeon: Izora Gala, MD;  Location: [place];  Service: ENT;  Laterality: Right;  Excision of a Right Ear Skin Cancer with Primary Closure  wound class per Dr. Constance Holster   . Tee without cardioversion N/A 03/31/2013    Procedure: TRANSESOPHAGEAL ECHOCARDIOGRAM (TEE);  Surgeon: Sanda Klein, MD;  Location: [facility]OSCOPY;  Service: Cardiovascular;  Laterality: N/A;  . Revision of arteriovenous goretex graft Right 10/19/2013    Procedure: REVISION OF ARTERIOVENOUS GORETEX GRAFT - thrombectomy;  Surgeon: Rosetta Posner, MD;  Location: [place];  Service: Vascular;  Laterality: Right;  . Cardiac catheterization N/A 01/08/2015    Procedure: Temporary Pacemaker;  Surgeon: Sanda Klein, MD;  Location: [place] INVASIVE CV LAB CUPID;  Service: Cardiovascular;  Laterality: N/A;  . Ep implantable device N/A 01/10/2015    Procedure: Pacemaker Implant;  Surgeon: Deboraha Sprang, MD;  Location: [facility] INVASIVE CV LAB [facility]UPID;  Service: Cardiovascular;  Laterality: N/A;     Current Outpatient Prescriptions  Medication Sig Dispense Refill  . allopurinol (ZYLOPRIM) 300 MG tablet Take 150 mg by mouth daily.     Marland Kitchen amLODipine (NORVASC) 10 MG tablet Take 10 mg by mouth daily.    Marland Kitchen aspirin EC 81 MG tablet Take 81 mg by mouth daily.    . B Complex-C-Folic Acid (RENA-VITE RX) 1 MG TABS Take 1 tablet by mouth daily.   3  . b complex-vitamin c-folic acid (NEPHRO-VITE) 0.8 MG TABS Take 0.8 mg by mouth daily.    . cetirizine (ZYRTEC) 10 MG tablet Take 10 mg by mouth daily.    . cinacalcet (SENSIPAR) 30 MG tablet Take 30 mg by mouth daily with supper.    . insulin NPH-insulin regular (NOVOLIN 70/30) (70-30) 100 UNIT/ML injection Inject 40 Units into the skin daily with breakfast.     . losartan (COZAAR) 100 MG tablet Take 100 mg by mouth daily. Take 1 tablet on Non-Dialysis days  3  .  nitroGLYCERIN (NITROSTAT) 0.4 MG SL tablet Place 1 tablet (0.4 mg total) under the tongue every 5 (five) minutes x 3 doses as needed for chest pain. 25 tablet 12  . omeprazole (PRILOSEC) 20 MG capsule Take 40 mg by mouth daily.     . polyethylene glycol (MIRALAX / GLYCOLAX) packet Take 17 g by mouth daily. Does not take on days of dialysis.    Marland Kitchen RENVELA 800 MG tablet Take 1,600 mg by mouth 3 (three) times daily with meals. 2 each meal  6  . sodium chloride (OCEAN) 0.65 % SOLN nasal spray Place 1 spray into the nose daily as needed for congestion.     No current facility-administered medications for this  visit.    Allergies:   Review of patient's allergies indicates no known allergies.    Social History:  The patient  reports that he quit smoking about 19 years ago. His smoking use included Cigars. He does not have any smokeless tobacco history on file. He reports that he does not drink alcohol or use illicit drugs.   Family History:  The patient's family history includes Cancer in his brother; Heart disease in his father; Hypertension in his brother and mother; Kidney disease in his father.    ROS:   Please see the history of present illness.   Review of Systems  Constitution: Positive for malaise/fatigue.  HENT: Positive for headaches.   Cardiovascular: Positive for chest pain and dyspnea on exertion.  Neurological: Positive for dizziness and loss of balance.  Psychiatric/Behavioral: Positive for depression. The patient is nervous/anxious.   All other systems reviewed and are negative.     PHYSICAL EXAM: VS:  BP 152/52 mmHg  Pulse 66  Ht 5\' 11"  (1.803 m)  Wt 271 lb (122.925 kg)  BMI 37.81 kg/m2  SpO2 97%    Wt Readings from Last 3 Encounters:  01/30/15 271 lb (122.925 kg)  01/12/15 270 lb 1 oz (122.5 kg)  09/11/14 285 lb (129.275 kg)     GEN: Well nourished, well developed, in no acute distress HEENT: normal Neck: no JVD,  no masses Cardiac:  Normal S1/S2, RRR; no  murmur ,  no rubs or gallops, trace bilateral LE edema  Chest: Pacer site well-healed without erythema or discharge Respiratory:  clear to auscultation bilaterally, no wheezing, rhonchi or rales. GI: soft, nontender, nondistended, + BS MS: no deformity or atrophy Skin: warm and dry  Neuro:  CNs II-XII intact, Strength and sensation are intact Psych: Normal affect   EKG:  EKG is ordered today.  It demonstrates:   AV paced, HR 66   Recent Labs: 01/08/2015: Magnesium 2.0; TSH 3.422 01/09/2015: ALT 13*; Hemoglobin 10.6*; Platelets 103* 01/11/2015: BUN 46*; Creatinine 8.58*; Potassium 4.6; Sodium 140    Lipid Panel No results found for: CHOL, TRIG, HDL, CHOLHDL, VLDL, LDLCALC, LDLDIRECT    ASSESSMENT AND PLAN:  Coronary artery disease involving native coronary artery of native heart with angina pectoris:  Since his rate response was turned on on his pacemaker, he has had exertional anginal symptoms. The device tech reviewed this with Dr. Caryl Comes today. Rate response was turned off. It is unclear if his symptoms are all related to the rate response mode or if he has had progressive CAD contributing to his symptoms. We discussed whether or not to start long-acting nitrates. However, with his recent issues with lower blood pressure during dialysis, we decided to hold off. We will see how he responds to having the rate response turned off. I will also arrange a Lexiscan Myoview to rule out significant ischemia. Continue aspirin, amlodipine. He is off of statin therapy for unclear reasons. He has follow-up in a couple of weeks at the [place] office with Cecilie Kicks, NP. He will keep that appointment. Further recommendations to follow depending upon the results of his stress test.  Complete heart block s/p Cardiac pacemaker in situ:  As noted, rate response turned off today. Otherwise his pacer is functioning appropriately.  ESRD (end stage renal disease):  He is on hemodialysis on Monday Wednesday  Friday.  Essential hypertension:  I advised him to continue taking his amlodipine as prescribed. He can try to take his losartan 100 mg on nondialysis days.  Current medicines are reviewed at length with the patient today.  Concerns regarding medicines are as outlined above.  The following changes have been made:    As above   Labs/ tests ordered today include:  Orders Placed This Encounter  Procedures  . Myocardial Perfusion Imaging  . EKG 12-Lead    Disposition:   FU with Cecilie Kicks, NP as planned.    Signed, Versie Starks, MHS 01/30/2015 4:08 PM    [facility] [address], [place], [place]  [address] Phone: [phone]; Fax: [phone]

## 2015-02-01 ENCOUNTER — Telehealth: Payer: Self-pay | Admitting: Internal Medicine

## 2015-02-01 NOTE — Telephone Encounter (Signed)
New Message  Pt had pacemaker placed on May 4, has questions about activity: what he can/cannot do. Please call back and discuss.

## 2015-02-01 NOTE — Telephone Encounter (Signed)
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  Patient advised this would be ok, but to make turns with opposite arm of device. Patient verbalized understanding and agreeable to plan.

## 2015-02-13 ENCOUNTER — Encounter: Payer: Self-pay | Admitting: Podiatry

## 2015-02-13 ENCOUNTER — Ambulatory Visit (INDEPENDENT_AMBULATORY_CARE_PROVIDER_SITE_OTHER): Payer: Medicare Other | Admitting: Podiatry

## 2015-02-13 DIAGNOSIS — L84 Corns and callosities: Secondary | ICD-10-CM | POA: Diagnosis not present

## 2015-02-13 DIAGNOSIS — E1140 Type 2 diabetes mellitus with diabetic neuropathy, unspecified: Secondary | ICD-10-CM | POA: Diagnosis not present

## 2015-02-13 DIAGNOSIS — E1149 Type 2 diabetes mellitus with other diabetic neurological complication: Secondary | ICD-10-CM

## 2015-02-13 NOTE — Patient Instructions (Signed)
Leave the felt pad on the bottom of the right foot 3-5 days if possible Wear the modified surgical shoe on the right foot Limit standing and walking

## 2015-02-13 NOTE — Progress Notes (Signed)
Patient ID: Antonio Page, male   DOB: 04-Jul-1941, 74 y.o.   MRN: [id number]  Subjective: This patient presents complaining of a very painful plantar skin lesion on the right foot  Objective: Large well-organized bleeding callus subsecond MPJ right that remains closed after debridement  Assessment: Pre-ulcerative plantar callus right Diabetic with peripheral neuropathy  Plan: Debride plantar callus Apply thick cut felt pad to offload plantar callus Dispensed Darco shoe with half-inch Plastizote to wear and right foot  Limit standing walking  Reappoint 2 weeks

## 2015-02-15 ENCOUNTER — Telehealth (HOSPITAL_COMMUNITY): Payer: Self-pay

## 2015-02-15 NOTE — Telephone Encounter (Signed)
Patient given detailed instructions per Myocardial Perfusion Study Information Sheet for test on 02-20-2015 at 09:00am. Patient Notified to arrive 15 minutes early, and that it is imperative to arrive on time for appointment to keep from having the test rescheduled. Patient verbalized understanding. Antonio Page, Marlyn Tondreau A

## 2015-02-19 ENCOUNTER — Ambulatory Visit: Payer: PRIVATE HEALTH INSURANCE | Admitting: Cardiology

## 2015-02-20 ENCOUNTER — Ambulatory Visit (HOSPITAL_COMMUNITY): Payer: Medicare Other | Attending: Physician Assistant

## 2015-02-20 DIAGNOSIS — R9439 Abnormal result of other cardiovascular function study: Secondary | ICD-10-CM | POA: Diagnosis not present

## 2015-02-20 DIAGNOSIS — R079 Chest pain, unspecified: Secondary | ICD-10-CM | POA: Insufficient documentation

## 2015-02-20 DIAGNOSIS — I25119 Atherosclerotic heart disease of native coronary artery with unspecified angina pectoris: Secondary | ICD-10-CM

## 2015-02-20 DIAGNOSIS — R0600 Dyspnea, unspecified: Secondary | ICD-10-CM | POA: Diagnosis not present

## 2015-02-20 LAB — MYOCARDIAL PERFUSION IMAGING
LV dias vol: 203 mL
LV sys vol: 111 mL
NUC STRESS EF: 45 %
Peak HR: 64 {beats}/min
RATE: 0.32
Rest HR: 60 {beats}/min
SDS: 2
SRS: 9
SSS: 11
TID: 1.01

## 2015-02-20 MED ORDER — REGADENOSON 0.4 MG/5ML IV SOLN
0.4000 mg | Freq: Once | INTRAVENOUS | Status: AC
  Administered 2015-02-20: 0.4 mg via INTRAVENOUS

## 2015-02-20 MED ORDER — TECHNETIUM TC 99M SESTAMIBI GENERIC - CARDIOLITE
33.0000 | Freq: Once | INTRAVENOUS | Status: AC | PRN
  Administered 2015-02-20: 33 via INTRAVENOUS

## 2015-02-20 MED ORDER — TECHNETIUM TC 99M SESTAMIBI GENERIC - CARDIOLITE
11.0000 | Freq: Once | INTRAVENOUS | Status: AC | PRN
  Administered 2015-02-20: 11 via INTRAVENOUS

## 2015-02-21 ENCOUNTER — Encounter: Payer: Self-pay | Admitting: Physician Assistant

## 2015-02-22 ENCOUNTER — Telehealth: Payer: Self-pay | Admitting: *Deleted

## 2015-02-22 NOTE — Telephone Encounter (Signed)
Patient and his wife called to clarify status of earlier message regarding test results and past medical history. Forwarded to [employer], [place]. He will call them this morning to speak with them personally.

## 2015-02-22 NOTE — Telephone Encounter (Signed)
Both pt and his wife have been notified of abnormal myoview results with verbal understanding to results given today. Pt advised to keep his appt 6/30 with Kerin Ransom, PA to discuss further. Pt advised if sxm persistant or worse see Dr. Gwenlyn Found sooner.

## 2015-02-27 ENCOUNTER — Encounter: Payer: Self-pay | Admitting: Podiatry

## 2015-02-27 ENCOUNTER — Ambulatory Visit (INDEPENDENT_AMBULATORY_CARE_PROVIDER_SITE_OTHER): Payer: Medicare Other | Admitting: Podiatry

## 2015-02-27 VITALS — BP 135/62 | HR 66 | Temp 98.0°F | Resp 20

## 2015-02-27 DIAGNOSIS — E1140 Type 2 diabetes mellitus with diabetic neuropathy, unspecified: Secondary | ICD-10-CM

## 2015-02-27 DIAGNOSIS — L84 Corns and callosities: Secondary | ICD-10-CM | POA: Diagnosis not present

## 2015-02-27 DIAGNOSIS — E1149 Type 2 diabetes mellitus with other diabetic neurological complication: Secondary | ICD-10-CM

## 2015-02-27 NOTE — Patient Instructions (Signed)
Apply Vaseline and a foam pad around bleeding callus on the bottom the right foot daily 3-5 days Continue wearing surgical shoe on right foot with Plastizote insole Okay to use topical over-the-counter and I fungal creams such as Lotrimin to rub into the fourth left web space daily 30 days  Diabetes and Foot Care Diabetes may cause you to have problems because of poor blood supply (circulation) to your feet and legs. This may cause the skin on your feet to become thinner, break easier, and heal more slowly. Your skin may become dry, and the skin may peel and crack. You may also have nerve damage in your legs and feet causing decreased feeling in them. You may not notice minor injuries to your feet that could lead to infections or more serious problems. Taking care of your feet is one of the most important things you can do for yourself.  HOME CARE INSTRUCTIONS  Wear shoes at all times, even in the house. Do not go barefoot. Bare feet are easily injured.  Check your feet daily for blisters, cuts, and redness. If you cannot see the bottom of your feet, use a mirror or ask someone for help.  Wash your feet with warm water (do not use hot water) and mild soap. Then pat your feet and the areas between your toes until they are completely dry. Do not soak your feet as this can dry your skin.  Apply a moisturizing lotion or petroleum jelly (that does not contain alcohol and is unscented) to the skin on your feet and to dry, brittle toenails. Do not apply lotion between your toes.  Trim your toenails straight across. Do not dig under them or around the cuticle. File the edges of your nails with an emery board or nail file.  Do not cut corns or calluses or try to remove them with medicine.  Wear clean socks or stockings every day. Make sure they are not too tight. Do not wear knee-high stockings since they may decrease blood flow to your legs.  Wear shoes that fit properly and have enough cushioning. To  break in new shoes, wear them for just a few hours a day. This prevents you from injuring your feet. Always look in your shoes before you put them on to be sure there are no objects inside.  Do not cross your legs. This may decrease the blood flow to your feet.  If you find a minor scrape, cut, or break in the skin on your feet, keep it and the skin around it clean and dry. These areas may be cleansed with mild soap and water. Do not cleanse the area with peroxide, alcohol, or iodine.  When you remove an adhesive bandage, be sure not to damage the skin around it.  If you have a wound, look at it several times a day to make sure it is healing.  Do not use heating pads or hot water bottles. They may burn your skin. If you have lost feeling in your feet or legs, you may not know it is happening until it is too late.  Make sure your health care provider performs a complete foot exam at least annually or more often if you have foot problems. Report any cuts, sores, or bruises to your health care provider immediately. SEEK MEDICAL CARE IF:   You have an injury that is not healing.  You have cuts or breaks in the skin.  You have an ingrown nail.  You notice  redness on your legs or feet.  You feel burning or tingling in your legs or feet.  You have pain or cramps in your legs and feet.  Your legs or feet are numb.  Your feet always feel cold. SEEK IMMEDIATE MEDICAL CARE IF:   There is increasing redness, swelling, or pain in or around a wound.  There is a red line that goes up your leg.  Pus is coming from a wound.  You develop a fever or as directed by your health care provider.  You notice a bad smell coming from an ulcer or wound. Document Released: 08/22/2000 Document Revised: 04/27/2013 Document Reviewed: 02/01/2013 [facility] Patient Information 2015 [employer] [employer], [place]. This information is not intended to replace advice given to you by your health care provider. Make sure you  discuss any questions you have with your health care provider.

## 2015-02-27 NOTE — Progress Notes (Signed)
   Subjective:    Patient ID: Antonio Page, male    DOB: [birth year], 74 y.o.   MRN: [id number]  HPI "I think it's doing good." he presents for ongoing debridement of pre-ulcerative plantar callus  , right foot Mentions some itching in the fourth left web space  Review of Systems     Objective:   Physical Exam  Orientated 3 Plantar callus right plantar second MPJ with bleeding within the plantar callus.. There is no surrounding erythema, edema, drainage, malodor  No skin lesions noted in the fourth left web space      Assessment & Plan:   Assessment: Pre-ulcerative plantar callus right hallux without aclinical sign of infection Possible tinea fourth webspace, left  Plan: Debridement plantar callus and apply protective pad around the callus area Okay to apply Vaseline and protective foam pad around the area Continue wearing modified Darco shoe with Plastizote insole  Apply topical anti-fungal cream such as Lotrimin to fourth left web space daily 30 days  Reappoint 2 weeks  Reappoint 2 weeks

## 2015-03-05 ENCOUNTER — Encounter: Payer: Self-pay | Admitting: Internal Medicine

## 2015-03-08 ENCOUNTER — Encounter: Payer: Self-pay | Admitting: Cardiology

## 2015-03-08 ENCOUNTER — Ambulatory Visit (INDEPENDENT_AMBULATORY_CARE_PROVIDER_SITE_OTHER): Payer: Medicare Other | Admitting: Cardiology

## 2015-03-08 VITALS — BP 138/54 | HR 64 | Ht 71.0 in | Wt 269.6 lb

## 2015-03-08 DIAGNOSIS — I25708 Atherosclerosis of coronary artery bypass graft(s), unspecified, with other forms of angina pectoris: Secondary | ICD-10-CM | POA: Diagnosis not present

## 2015-03-08 NOTE — Patient Instructions (Signed)
Please continue all your current medications. OK to take nitroglycerin if you get anginal chest pain. Please increase your activity SLOWLY as tolerated. If you start having more chest pain, call us and we will either increase the medical therapy or schedule a heart catheterization.  Keep followup appointment with Dr Caryl Comes in August.  Make sure they instruct you in how to do remote transmissions of your pacemaker.

## 2015-03-08 NOTE — Progress Notes (Signed)
Cardiology Office Note   Date:  03/08/2015   ID:  Antonio Page, DOB February 16, 1941, MRN [id number]  PCP:  Geoffery Lyons, MD  Cardiologist:   Dr. Gwenlyn Found  Electrophysiologist :  Dr. Suzie Portela, PA-C   Chief Complaint  Patient presents with  . Follow-up    No complaints of chest pain or edema.  Occas. SOB with activity.  Mild dizziness.    History of Present Illness: Antonio Page is a 74 y.o. male with a history of 74 y.o. 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  Antonio Page presents for follow-up with a stress test. He had a Myoview earlier this month that showed prior infarction inferolateral area and an EF of 45%.    Mr. Antonio Page had some angina but his symptoms resolved once his pacemaker was adjusted. He is able to walk 100 feet at a time but admits that he does not routinely walk more than that at one time. He is tolerating dialysis well and has no lower extremity edema. He does not recall any blood pressure problems during dialysis. He is not having any palpitations or presyncope. Since they adjusted his pacemaker, he has not had any chest pain. He has a nagging left chest pain that is there most of the time and has been there for very long time. He admits this is probably not his heart.    He is able to mow the yard with the tractor , he is working with physical therapy 2 improve his activity level and is able to do what he wants without chest pain or shortness of breath. He is using incentive spirometry regularly and feels that he is breathing well. He still has problems with hypoxia and his oxygen saturation yesterday was 88%. He is trying to be compliant with oxygen at night but does not use it much during the day.   Past Medical History  Diagnosis Date  . Hypertension   . Sleep apnea     uses a cpap  . Diabetes mellitus   . GERD (gastroesophageal  reflux disease)   . Arthritis   . Glaucoma   . Dialysis patient   . Blind right eye   . Chronic kidney disease     dialysis  . Kidney stones   . Coronary artery disease     hx of stent to RCA  . Hypoxia 01/11/2015  . CHB (complete heart block) 01/08/15    PPM placed MDT  . Hx of cardiovascular stress test     Lexiscan Myoview 6/16:  Myocardial perfusion is abnormal. Findings consistent with prior myocardial infarction. This is an intermediate risk study. Overall left ventricular systolic function was abnormal. The left ventricular ejection fraction is mildly decreased (45-54%).     Past Surgical History  Procedure Laterality Date  . Dg av dialysis  shunt access exist*l* or  2010    left upper arm-not using  . Dg av dialysis  shunt access exist*l* or  2012    right upper arm-using this one  . Shoulder arthroscopy      right  . Knee arthroscopy      left  . Tonsillectomy    . Eye surgery      multiple rt eye surgeries  . Coronary stent placement   1999     RCA  . Trigger finger release  06/08/2012  Procedure: RELEASE TRIGGER FINGER/A-1 PULLEY;  Surgeon: Cammie Sickle., MD;  Location: [facility];  Service: Orthopedics;  Laterality: Right;  . Dupuytren contracture release  06/08/2012    Procedure: DUPUYTREN CONTRACTURE RELEASE;  Surgeon: Cammie Sickle., MD;  Location: [facility];  Service: Orthopedics;  Laterality: Right;  right ring fascia excision with A1 Pulley right ring  . Cystoscopy with retrograde pyelogram, ureteroscopy and stent placement  07/21/2012    Procedure: [place], URETEROSCOPY AND STENT PLACEMENT;  Surgeon: Malka So, MD;  Location: [facility];  Service: Urology;  Laterality: Right;  . Mass excision  07/26/2012    Procedure: MINOR EXCISION OF MASS;  Surgeon: Izora Gala, MD;  Location: [facility];  Service: ENT;  Laterality: Right;  Excision of a Right Ear Skin Cancer with Primary  Closure  wound class per Dr. Constance Holster   . Tee without cardioversion N/A 03/31/2013    Procedure: TRANSESOPHAGEAL ECHOCARDIOGRAM (TEE);  Surgeon: Sanda Klein, MD;  Location: [facility] ENDOSCOPY;  Service: Cardiovascular;  Laterality: N/A;  . Revision of arteriovenous goretex graft Right 10/19/2013    Procedure: REVISION OF ARTERIOVENOUS GORETEX GRAFT - thrombectomy;  Surgeon: Rosetta Posner, MD;  Location: [facility];  Service: Vascular;  Laterality: Right;  . Cardiac catheterization N/A 01/08/2015    Procedure: Temporary Pacemaker;  Surgeon: Sanda Klein, MD;  Location: [place] INVASIVE CV LAB CUPID;  Service: Cardiovascular;  Laterality: N/A;  . Ep implantable device N/A 01/10/2015    Procedure: Pacemaker Implant;  Surgeon: Deboraha Sprang, MD;  Location: [facility] INVAS[facility] CV LAB [facility]UP[facility];  Service: Cardiovascular;  Laterality: N/A;    Current Outpatient Prescriptions  Medication Sig Dispense Refill  . allopurinol (ZYLOPRIM) 300 MG tablet Take 150 mg by mouth daily.     Marland Kitchen amLODipine (NORVASC) 10 MG tablet Take 10 mg by mouth daily.    Marland Kitchen aspirin EC 81 MG tablet Take 81 mg by mouth daily.    Marland Kitchen b complex-vitamin c-folic acid (NEPHRO-VITE) 0.8 MG TABS Take 0.8 mg by mouth daily.    . cetirizine (ZYRTEC) 10 MG tablet Take 10 mg by mouth daily.    . cinacalcet (SENSIPAR) 30 MG tablet Take 30 mg by mouth daily with supper.    . Ferric Citrate (AURYXIA) 210 MG TABS Take 2 tablets by mouth 3 (three) times daily.    . insulin NPH-insulin regular (NOVOLIN 70/30) (70-30) 100 UNIT/ML injection Inject 40 Units into the skin daily with breakfast.     . losartan (COZAAR) 100 MG tablet Take 100 mg by mouth daily. Take 1 tablet on Non-Dialysis days  3  . nitroGLYCERIN (NITROSTAT) 0.4 MG SL tablet Place 1 tablet (0.4 mg total) under the tongue every 5 (five) minutes x 3 doses as needed for chest pain. 25 tablet 12  . omeprazole (PRILOSEC) 20 MG capsule Take 40 mg by mouth daily.     . polyethylene glycol (MIRALAX / GLYCOLAX) packet Take 17 g  by mouth daily. Does not take on days of dialysis.    Marland Kitchen sodium chloride (OCEAN) 0.65 % SOLN nasal spray Place 1 spray into the nose daily as needed for congestion.     No current facility-administered medications for this visit.    Allergies:   Review of patient's allergies indicates no known allergies.    Social History:  The patient  reports that he quit smoking about 19 years ago. His smoking use included Cigars. He does not have any  smokeless tobacco history on file. He reports that he does not drink alcohol or use illicit drugs.   Family History:  The patient's family history includes Cancer in his brother; Heart disease in his father; Hypertension in his brother and mother; Kidney disease in his father.  ROS:  Please see the history of present illness. All other systems are reviewed and negative.    PHYSICAL EXAM: VS:  BP 138/54 mmHg  Pulse 64  Ht 5\' 11"  (1.803 m)  Wt 269 lb 9.6 oz (122.29 kg)  BMI 37.62 kg/m2 , BMI Body mass index is 37.62 kg/(m^2). GEN: Well nourished, well developed, in no acute distress HEENT: normal Neck: no JVD, carotid bruits, or masses Cardiac: RRR; no murmurs, rubs, or gallops,no edema. Distal pulses 2+ in both lower extremities , thrill noted at current dialysis site but both radial pulses intact.  Respiratory:   Rales, left greater than right, normal work of breathing , good expansion and air exchange GI: soft, nontender, nondistended, + BS MS: no deformity or atrophy Skin: warm and dry, no rash Neuro:  Strength and sensation are intact Psych: euthymic mood, full affect   EKG:  EKG is not ordered today. The ekg ordered today demonstrates  N/A   Myoview 02/20/2015  There is a medium defect of moderate severity present in the mid inferolateral, apical inferior and apical lateral location. The defect is non-reversible.  No ischemia, EF 45%   Study Impression Myocardial perfusion is abnormal. Findings consistent with prior myocardial infarction.  This is an intermediate risk study. Overall left ventricular systolic function was abnormal. The left ventricular ejection fraction is mildly decreased (45-54%).      Recent Labs: 01/08/2015: Magnesium 2.0; TSH 3.422 01/09/2015: ALT 13*; Hemoglobin 10.6*; Platelets 103* 01/11/2015: BUN 46*; Creatinine, Ser 8.58*; Potassium 4.6; Sodium 140    Lipid Panel No results found for: CHOL, TRIG, HDL, CHOLHDL, VLDL, LDLCALC, LDLDIRECT   Wt Readings from Last 3 Encounters:  03/08/15 269 lb 9.6 oz (122.29 kg)  02/20/15 271 lb (122.925 kg)  01/30/15 271 lb (122.925 kg)      Other studies Reviewed: Additional studies/ records that were reviewed today include:  My view, previous office visits.  ASSESSMENT AND PLAN:  1.   Abnormal Myoview : I discussed treatment options with the patient and his wife. The pros and cons of medical therapy versus heart catheterization, including the risks and benefits of a heart catheterization, were reviewed.   Currently Mr. Thilges wishes to pursue medical therapy. He does not feel he is symptomatic enough to warrant adding isosorbide to his medication regimen but he understands that we can do that in the future if he needs it. He also understands that he is to contact us if he feels he is getting additional symptoms. He is encouraged to increase his activity as tolerated.   2. Recent pacemaker insertion: his pacemaker site is healing well. Since it was last adjusted, he is having no issues or concerns. He understands that he will follow-up with Dr. Caryl Comes in August and then follow-up largely with Dr. Gwenlyn Found. He will make sure that he is instructed and remote transmission when he sees Dr. Caryl Comes in August.   Current medicines are reviewed at length with the patient today.  The patient does not have concerns regarding medicines.  The following changes have been made:  no change  Labs/ tests ordered today include: none  No orders of the defined types were placed in this  encounter.  Disposition:   FU with  Dr. Caryl Comes and Dr. Gwenlyn Found as scheduled   Signed, Lenoard Aden  03/08/2015 9:45 AM    [facility] [place], [place],   [address] Phone: [phone]; Fax: [phone]

## 2015-03-13 ENCOUNTER — Ambulatory Visit (INDEPENDENT_AMBULATORY_CARE_PROVIDER_SITE_OTHER): Payer: Medicare Other | Admitting: Podiatry

## 2015-03-13 ENCOUNTER — Encounter: Payer: Self-pay | Admitting: Podiatry

## 2015-03-13 VITALS — BP 167/76 | HR 60 | Resp 12

## 2015-03-13 DIAGNOSIS — E1140 Type 2 diabetes mellitus with diabetic neuropathy, unspecified: Secondary | ICD-10-CM | POA: Diagnosis not present

## 2015-03-13 DIAGNOSIS — E1149 Type 2 diabetes mellitus with other diabetic neurological complication: Secondary | ICD-10-CM

## 2015-03-13 DIAGNOSIS — L84 Corns and callosities: Secondary | ICD-10-CM

## 2015-03-13 NOTE — Patient Instructions (Signed)
Diabetes and Foot Care Diabetes may cause you to have problems because of poor blood supply (circulation) to your feet and legs. This may cause the skin on your feet to become thinner, break easier, and heal more slowly. Your skin may become dry, and the skin may peel and crack. You may also have nerve damage in your legs and feet causing decreased feeling in them. You may not notice minor injuries to your feet that could lead to infections or more serious problems. Taking care of your feet is one of the most important things you can do for yourself.  HOME CARE INSTRUCTIONS  Wear shoes at all times, even in the house. Do not go barefoot. Bare feet are easily injured.  Check your feet daily for blisters, cuts, and redness. If you cannot see the bottom of your feet, use a mirror or ask someone for help.  Wash your feet with warm water (do not use hot water) and mild soap. Then pat your feet and the areas between your toes until they are completely dry. Do not soak your feet as this can dry your skin.  Apply a moisturizing lotion or petroleum jelly (that does not contain alcohol and is unscented) to the skin on your feet and to dry, brittle toenails. Do not apply lotion between your toes.  Trim your toenails straight across. Do not dig under them or around the cuticle. File the edges of your nails with an emery board or nail file.  Do not cut corns or calluses or try to remove them with medicine.  Wear clean socks or stockings every day. Make sure they are not too tight. Do not wear knee-high stockings since they may decrease blood flow to your legs.  Wear shoes that fit properly and have enough cushioning. To break in new shoes, wear them for just a few hours a day. This prevents you from injuring your feet. Always look in your shoes before you put them on to be sure there are no objects inside.  Do not cross your legs. This may decrease the blood flow to your feet.  If you find a minor scrape,  cut, or break in the skin on your feet, keep it and the skin around it clean and dry. These areas may be cleansed with mild soap and water. Do not cleanse the area with peroxide, alcohol, or iodine.  When you remove an adhesive bandage, be sure not to damage the skin around it.  If you have a wound, look at it several times a day to make sure it is healing.  Do not use heating pads or hot water bottles. They may burn your skin. If you have lost feeling in your feet or legs, you may not know it is happening until it is too late.  Make sure your health care provider performs a complete foot exam at least annually or more often if you have foot problems. Report any cuts, sores, or bruises to your health care provider immediately. SEEK MEDICAL CARE IF:   You have an injury that is not healing.  You have cuts or breaks in the skin.  You have an ingrown nail.  You notice redness on your legs or feet.  You feel burning or tingling in your legs or feet.  You have pain or cramps in your legs and feet.  Your legs or feet are numb.  Your feet always feel cold. SEEK IMMEDIATE MEDICAL CARE IF:   There is increasing redness,   swelling, or pain in or around a wound.  There is a red line that goes up your leg.  Pus is coming from a wound.  You develop a fever or as directed by your health care provider.  You notice a bad smell coming from an ulcer or wound. Document Released: 08/22/2000 Document Revised: 04/27/2013 Document Reviewed: 02/01/2013 [employer] Patient Information 2015 [employer]. This information is not intended to replace advice given to you by your health care provider. Make sure you discuss any questions you have with your health care provider.  

## 2015-03-13 NOTE — Progress Notes (Signed)
Patient ID: Antonio Page, male   DOB: 1940-09-12, 74 y.o.   MRN: [id number]  Subjective: This patient presents for ongoing management for pre-ulcerative plantar callus on the right foot. Patient also is applying over-the-counter and I fungal cream to the fourth left web space for approximately 2 weeks  Objective: Plantar second right MPJ has bleeding callus that remains closed after debridement Fourth left web spaces no skin lesions  Assessment: Pre-ulcerative plantar keratoses right Diabetic with a history of neuropathy Resolving tinea pedis fourth left web space  Plan: Debridement plantar keratoses right I attach an additional felt pad to offload the plantar second MPJ right and patient's existing diabetic insole Continue apply topical and I fungal cream to the fourth left web space additional 2 weeks

## 2015-03-19 ENCOUNTER — Ambulatory Visit: Payer: Medicare Other | Admitting: Podiatry

## 2015-04-13 ENCOUNTER — Encounter: Payer: Self-pay | Admitting: Internal Medicine

## 2015-04-13 ENCOUNTER — Ambulatory Visit (INDEPENDENT_AMBULATORY_CARE_PROVIDER_SITE_OTHER): Payer: Medicare Other | Admitting: Internal Medicine

## 2015-04-13 VITALS — BP 138/48 | HR 61 | Ht 71.0 in | Wt 273.8 lb

## 2015-04-13 DIAGNOSIS — I442 Atrioventricular block, complete: Secondary | ICD-10-CM | POA: Diagnosis not present

## 2015-04-13 DIAGNOSIS — Z45018 Encounter for adjustment and management of other part of cardiac pacemaker: Secondary | ICD-10-CM | POA: Diagnosis not present

## 2015-04-13 DIAGNOSIS — I25708 Atherosclerosis of coronary artery bypass graft(s), unspecified, with other forms of angina pectoris: Secondary | ICD-10-CM

## 2015-04-13 LAB — CUP PACEART INCLINIC DEVICE CHECK
Battery Impedance: 100 Ohm
Battery Voltage: 2.79 V
Brady Statistic AP VP Percent: 68 %
Brady Statistic AP VS Percent: 0 %
Brady Statistic AS VP Percent: 31 %
Brady Statistic AS VS Percent: 0 %
Lead Channel Impedance Value: 610 Ohm
Lead Channel Pacing Threshold Amplitude: 0.5 V
Lead Channel Pacing Threshold Amplitude: 1.25 V
Lead Channel Pacing Threshold Pulse Width: 0.4 ms
Lead Channel Sensing Intrinsic Amplitude: 2 mV
Lead Channel Setting Pacing Pulse Width: 0.4 ms
MDC IDC MSMT BATTERY REMAINING LONGEVITY: 129 mo
MDC IDC MSMT LEADCHNL RA IMPEDANCE VALUE: 459 Ohm
MDC IDC MSMT LEADCHNL RA PACING THRESHOLD PULSEWIDTH: 0.4 ms
MDC IDC MSMT LEADCHNL RV SENSING INTR AMPL: 22.4 mV
MDC IDC SESS DTM: 20160805142754
MDC IDC SET LEADCHNL RA PACING AMPLITUDE: 2.5 V
MDC IDC SET LEADCHNL RV PACING AMPLITUDE: 2 V
MDC IDC SET LEADCHNL RV SENSING SENSITIVITY: 5.6 mV

## 2015-04-13 NOTE — Progress Notes (Signed)
Patient Care Team: Burnard Bunting, MD as PCP - General   HPI  Antonio Page is a 74 y.o. male Seen following pacemaker implantation 5/16 for documented sinus node arrest bifascicular block and recurrent syncope. He was initially associated with mild hyperkalemia which was felt to be the cause. However, given the only modest nature, after discussions with nephrology, pacemaker implantation was undertaken.  There has been no recurrent syncope.  He has a history of end-stage renal disease and coronary artery disease with prior RCA stenting.  Records and Results Reviewed Myoview 6/16 demonstrating inferior wall infarct* Echocardiogram 5/16 demonstrating normal LV function     evaluation after the Myoview included discussion regarding catheterization; with a paucity of symptoms, this was declined   Past Medical History  Diagnosis Date  . Hypertension   . Sleep apnea     uses a cpap  . Diabetes mellitus   . GERD (gastroesophageal reflux disease)   . Arthritis   . Glaucoma   . Dialysis patient   . Blind right eye   . Chronic kidney disease     dialysis  . Kidney stones   . Coronary artery disease     hx of stent to RCA  . Hypoxia 01/11/2015  . CHB (complete heart block) 01/08/15    PPM placed MDT  . Hx of cardiovascular stress test     Lexiscan Myoview 6/16:  Myocardial perfusion is abnormal. Findings consistent with prior myocardial infarction. This is an intermediate risk study. Overall left ventricular systolic function was abnormal. The left ventricular ejection fraction is mildly decreased (45-54%).     Past Surgical History  Procedure Laterality Date  . Dg av dialysis  shunt access exist*l* or  2010    left upper arm-not using  . Dg av dialysis  shunt access exist*l* or  2012    right upper arm-using this one  . Shoulder arthroscopy      right  . Knee arthroscopy      left  . Tonsillectomy    . Eye surgery      multiple rt eye surgeries  . Coronary  stent placement   1999     RCA  . Trigger finger release  06/08/2012    Procedure: RELEASE TRIGGER FINGER/A-1 PULLEY;  Surgeon: Cammie Sickle., MD;  Location: [place];  Service: Orthopedics;  Laterality: Right;  . Dupuytren contracture release  06/08/2012    Procedure: DUPUYTREN CONTRACTURE RELEASE;  Surgeon: Cammie Sickle., MD;  Location: [place];  Service: Orthopedics;  Laterality: Right;  right ring fascia excision with A1 Pulley right ring  . Cystoscopy with retrograde pyelogram, ureteroscopy and stent placement  07/21/2012    Procedure: [place], URETEROSCOPY AND STENT PLACEMENT;  Surgeon: Malka So, MD;  Location: [facility];  Service: Urology;  Laterality: Right;  . 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 Constance Holster   . Tee without cardioversion N/A 03/31/2013    Procedure: TRANSESOPHAGEAL ECHOCARDIOGRAM (TEE);  Surgeon: Sanda Oswald Pott, MD;  Location: [facility] ENDOSCOPY;  Service: Cardiovascular;  Laterality: N/A;  . Revision of arteriovenous goretex graft Right 10/19/2013    Procedure: REVISION OF ARTERIOVENOUS GORETEX GRAFT - thrombectomy;  Surgeon: Rosetta Posner, MD;  Location: [facility];  Service: Vascular;  Laterality: Right;  . Cardiac catheterization N/A 01/08/2015    Procedure: Temporary Pacemaker;  Surgeon: Sanda Chiamaka Latka, MD;  Location: [place];  Service: Cardiovascular;  Laterality: N/A;  . Ep implantable device N/A 01/10/2015    Procedure: Pacemaker Implant;  Surgeon: Deboraha Sprang, MD;  Location: [facility] INVASIVE CV LAB CUPID;  Service: Cardiovascular;  Laterality: N/A;    Current Outpatient Prescriptions  Medication Sig Dispense Refill  . allopurinol (ZYLOPRIM) 300 MG tablet Take 150 mg by mouth daily.     Marland Kitchen  amLODipine (NORVASC) 10 MG tablet Take 10 mg by mouth daily.    Marland Kitchen aspirin EC 81 MG tablet Take 81 mg by mouth daily.    Marland Kitchen b complex-vitamin c-folic acid (NEPHRO-VITE) 0.8 MG TABS Take 0.8 mg by mouth daily.    . cetirizine (ZYRTEC) 10 MG tablet Take 10 mg by mouth daily.    . cinacalcet (SENSIPAR) 30 MG tablet Take 30 mg by mouth daily with supper.    . Ferric Citrate (AURYXIA) 210 MG TABS Take 2 tablets by mouth 3 (three) times daily.    . insulin NPH-insulin regular (NOVOLIN 70/30) (70-30) 100 UNIT/ML injection Inject 40 Units into the skin 2 (two) times daily.     Marland Kitchen losartan (COZAAR) 100 MG tablet Take 100 mg by mouth as directed. Take 1 tablet on Non-Dialysis days  3  . nitroGLYCERIN (NITROSTAT) 0.4 MG SL tablet Place 1 tablet (0.4 mg total) under the tongue every 5 (five) minutes x 3 doses as needed for chest pain. 25 tablet 12  . omeprazole (PRILOSEC) 20 MG capsule Take 40 mg by mouth daily.     . polyethylene glycol (MIRALAX / GLYCOLAX) packet Take 17 g by mouth daily.     . sodium chloride (OCEAN) 0.65 % SOLN nasal spray Place 1 spray into the nose daily as needed for congestion.     No current facility-administered medications for this visit.    No Known Allergies    Review of Systems negative except from HPI and PMH  Physical Exam BP 138/48 mmHg  Pulse 61  Ht 5\' 11"  (1.803 m)  Wt 273 lb 12.8 oz (124.195 kg)  BMI 38.20 kg/m2 Well developed and well nourished in no acute distress HENT normal E scleral and icterus clear Neck Supple JVP flat; carotids brisk and full Clear to ausculation Device pocket well healed; without hematoma or erythema.  There is no tethering Regular rate and rhythm, no murmurs gallops or rub Soft with active bowel sounds No clubbing cyanosis Trace Edema Alert and oriented, grossly normal motor and sensory function Skin Warm and Dry  ECG demonstrated P synchronous pacing at a rate of 61  Assessment and  Plan Ischemic heart  disease  Hypertension  Syncope  Sinus node dysfunction  Bifascicular block  Pacemaker-[employer] The patient's device was interrogated and the information was fully reviewed.  The device was reprogrammed to activate MVP and maximize battery longevity  There is no recurrent syncope.  Blood pressure is well-controlled  Without symptoms of ischemia

## 2015-04-13 NOTE — Patient Instructions (Addendum)
Medication Instructions:  Your physician recommends that you continue on your current medications as directed. Please refer to the Current Medication list given to you today.  Labwork: None ordered  Testing/Procedures: None ordered  Follow-Up: Remote monitoring is used to monitor your Pacemaker of ICD from home. This monitoring reduces the number of office visits required to check your device to one time per year. It allows us to keep an eye on the functioning of your device to ensure it is working properly. You are scheduled for a device check from home on 07/16/15. You may send your transmission at any time that day. If you have a wireless device, the transmission will be sent automatically. After your physician reviews your transmission, you will receive a postcard with your next transmission date.  Your physician wants you to follow-up in: 9 months with Dr. Klein.  You will receive a reminder letter in the mail two months in advance. If you don't receive a letter, please call our office to schedule the follow-up appointment.   Any Other Special Instructions Will Be Listed Below (If Applicable). Thank you for choosing [facility]!!         

## 2015-04-24 ENCOUNTER — Ambulatory Visit: Payer: Medicare Other | Admitting: Pulmonary Disease

## 2015-04-24 ENCOUNTER — Ambulatory Visit (INDEPENDENT_AMBULATORY_CARE_PROVIDER_SITE_OTHER): Payer: Medicare Other | Admitting: Podiatry

## 2015-04-24 ENCOUNTER — Encounter: Payer: Self-pay | Admitting: Podiatry

## 2015-04-24 DIAGNOSIS — E1149 Type 2 diabetes mellitus with other diabetic neurological complication: Secondary | ICD-10-CM

## 2015-04-24 DIAGNOSIS — B351 Tinea unguium: Secondary | ICD-10-CM

## 2015-04-24 DIAGNOSIS — E1140 Type 2 diabetes mellitus with diabetic neuropathy, unspecified: Secondary | ICD-10-CM

## 2015-04-24 NOTE — Patient Instructions (Signed)
Diabetes and Foot Care Diabetes may cause you to have problems because of poor blood supply (circulation) to your feet and legs. This may cause the skin on your feet to become thinner, break easier, and heal more slowly. Your skin may become dry, and the skin may peel and crack. You may also have nerve damage in your legs and feet causing decreased feeling in them. You may not notice minor injuries to your feet that could lead to infections or more serious problems. Taking care of your feet is one of the most important things you can do for yourself.  HOME CARE INSTRUCTIONS  Wear shoes at all times, even in the house. Do not go barefoot. Bare feet are easily injured.  Check your feet daily for blisters, cuts, and redness. If you cannot see the bottom of your feet, use a mirror or ask someone for help.  Wash your feet with warm water (do not use hot water) and mild soap. Then pat your feet and the areas between your toes until they are completely dry. Do not soak your feet as this can dry your skin.  Apply a moisturizing lotion or petroleum jelly (that does not contain alcohol and is unscented) to the skin on your feet and to dry, brittle toenails. Do not apply lotion between your toes.  Trim your toenails straight across. Do not dig under them or around the cuticle. File the edges of your nails with an emery board or nail file.  Do not cut corns or calluses or try to remove them with medicine.  Wear clean socks or stockings every day. Make sure they are not too tight. Do not wear knee-high stockings since they may decrease blood flow to your legs.  Wear shoes that fit properly and have enough cushioning. To break in new shoes, wear them for just a few hours a day. This prevents you from injuring your feet. Always look in your shoes before you put them on to be sure there are no objects inside.  Do not cross your legs. This may decrease the blood flow to your feet.  If you find a minor scrape,  cut, or break in the skin on your feet, keep it and the skin around it clean and dry. These areas may be cleansed with mild soap and water. Do not cleanse the area with peroxide, alcohol, or iodine.  When you remove an adhesive bandage, be sure not to damage the skin around it.  If you have a wound, look at it several times a day to make sure it is healing.  Do not use heating pads or hot water bottles. They may burn your skin. If you have lost feeling in your feet or legs, you may not know it is happening until it is too late.  Make sure your health care provider performs a complete foot exam at least annually or more often if you have foot problems. Report any cuts, sores, or bruises to your health care provider immediately. SEEK MEDICAL CARE IF:   You have an injury that is not healing.  You have cuts or breaks in the skin.  You have an ingrown nail.  You notice redness on your legs or feet.  You feel burning or tingling in your legs or feet.  You have pain or cramps in your legs and feet.  Your legs or feet are numb.  Your feet always feel cold. SEEK IMMEDIATE MEDICAL CARE IF:   There is increasing redness,   swelling, or pain in or around a wound.  There is a red line that goes up your leg.  Pus is coming from a wound.  You develop a fever or as directed by your health care provider.  You notice a bad smell coming from an ulcer or wound. Document Released: 08/22/2000 Document Revised: 04/27/2013 Document Reviewed: 02/01/2013 [employer] Patient Information 2015 [employer]. This information is not intended to replace advice given to you by your health care provider. Make sure you discuss any questions you have with your health care provider.  

## 2015-04-24 NOTE — Progress Notes (Signed)
Patient ID: Antonio Page, male   DOB: 03/11/1941, 74 y.o.   MRN: [id number] Subjective: This patient presents for follow-up care for pre-ulcerative plantar callus on the right foot and a tinea webspace infection fourth web webspace which she is apply topical anti-fungal cream. He also is requesting debridement of toenails  Objective: Patient presents with his son a treatment room as orientated 3 Toenails 2-5 are hypertrophic, elongated, brittle, discolored Plantar callus subsecond MPJ right has no bleeding within the callused There is no interdigital web space scaling spaces 1-4 bilaterally  Assessment: Diabetic with a history of peripheral neuropathy Mycotic toenails 8 Resolve tinea webspace infection left Plantar callus right without pre-ulcerative changes  Plan: Advised patient that webspace lesions have cleared and he does not need to apply any further topical anti-fungal cream Debridement of toenails mechanically and electrically 8 without any bleeding  Reappoint 3 months

## 2015-06-25 ENCOUNTER — Telehealth: Payer: Self-pay | Admitting: Internal Medicine

## 2015-06-25 NOTE — Telephone Encounter (Signed)
Left message for patient to call office and ask for triage 

## 2015-06-25 NOTE — Progress Notes (Signed)
Cardiology Office Note   Date:  06/26/2015   ID:  Antonio Page, DOB 08-16-1941, MRN [id number]  PCP:  Geoffery Lyons, MD  Cardiologist:  Dr. Quay Burow   Electrophysiologist:  Dr. Virl Axe    Chief Complaint  Patient presents with  . Chest Pain  . Coronary Artery Disease     History of Present Illness: Antonio Page is a 74 y.o. male with a hx of CAD status post stenting of the RCA in 1999, HTN, diabetes, ESRD, questionable sarcoidosis, OSA, anemia of chronic disease. Last seen by Dr. Gwenlyn Found in 2014  Admitted 01/2015 after presenting to the emergency room with multiple episodes of syncope in the setting of hyperkalemia with potassium of 6.2 and intermittent third-degree heart block.  He ultimately underwent dual-chamber pacemaker implantation.    In 5/16, he developed malaise and chest discomfort after his rate response was turned on his device and a follow-up visit. Rate response was turned off. Lexiscan Myoview was arranged. This was intermediate risk with inferolateral apical infarct, no ischemia, EF 45%. In follow-up, he was not that symptomatic. Cardiac catheterization was deferred. He saw Dr. Caryl Comes in 8/16 for follow-up.  His chart indicates that when he saw Dr. Caryl Comes in 8/16, the patient requested that the rate response feature be turned back on. However, this was kept off due to prior complaints of chest discomfort. He called in recently with recurrent chest discomfort and was added on to my schedule today for further evaluation.  Over the past month, he has noted substernal chest discomfort that he describes as angina. This occurs with minimal activities. He can experience symptoms going up steps or with doing routine activities of daily living. He denies symptoms at rest. He does note radiation up into his neck. He does note associated dyspnea. He denies associated nausea or diaphoresis. He denies syncope. He denies orthopnea. He sleeps with CPAP. He denies LE  edema. He also has chronic chest discomfort that is related to positional changes and increased with coughing. This has been stable for over a year without significant change.   Studies/Reports Reviewed Today:  Myoview 02/20/15 Inferolateral and apical infarct, no ischemia, EF 45%, intermediate risk  Echo 01/08/15 EF 55-60%, normal wall motion, MAC, mild MR, moderate LAE Impressions:- Incomplete study. Study was terminated secondary to periods ofasystole.  Myoview 12/26/08 No ischemia  Past Medical History  Diagnosis Date  . Hypertension   . Sleep apnea     uses a cpap  . Diabetes mellitus   . GERD (gastroesophageal reflux disease)   . Arthritis   . Glaucoma   . Dialysis patient ([facility])   . Blind right eye   . Chronic kidney disease     dialysis  . Kidney stones   . Coronary artery disease     hx of stent to RCA  . Hypoxia 01/11/2015  . CHB (complete heart block) ([place]) 01/08/15    PPM placed MDT  . Hx of cardiovascular stress test     Lexiscan Myoview 6/16:  Myocardial perfusion is abnormal. Findings consistent with prior myocardial infarction. This is an intermediate risk study. Overall left ventricular systolic function was abnormal. The left ventricular ejection fraction is mildly decreased (45-54%).     Past Surgical History  Procedure Laterality Date  . Dg av dialysis  shunt access exist*l* or  2010    left upper arm-not using  . Dg av dialysis  shunt access exist*l* or  2012    right upper  arm-using this one  . Shoulder arthroscopy      right  . Knee arthroscopy      left  . Tonsillectomy    . Eye surgery      multiple rt eye surgeries  . Coronary stent placement   1999     RCA  . Trigger finger release  06/08/2012    Procedure: RELEASE TRIGGER FINGER/A-1 PULLEY;  Surgeon: Cammie Sickle., MD;  Location: [facility];  Service: Orthopedics;  Laterality: Right;  . Dupuytren contracture release  06/08/2012    Procedure: DUPUYTREN CONTRACTURE RELEASE;   Surgeon: Cammie Sickle., MD;  Location: [facility];  Service: Orthopedics;  Laterality: Right;  right ring fascia excision with A1 Pulley right ring  . Cystoscopy with retrograde pyelogram, ureteroscopy and stent placement  07/21/2012    Procedure: [place], URETEROSCOPY AND STENT PLACEMENT;  Surgeon: Malka So, MD;  Location: [facility];  Service: Urology;  Laterality: Right;  . Mass excision  07/26/2012    Procedure: MINOR EXCISION OF MASS;  Surgeon: Izora Gala, MD;  Location: [facility];  Service: ENT;  Laterality: Right;  Excision of a Right Ear Skin Cancer with Primary Closure  wound class per Dr. Constance Holster   . Tee without cardioversion N/A 03/31/2013    Procedure: TRANSESOPHAGEAL ECHOCARDIOGRAM (TEE);  Surgeon: Sanda Klein, MD;  Location: [facility];  Service: Cardiovascular;  Laterality: N/A;  . Revision of arteriovenous goretex graft Right 10/19/2013    Procedure: REVISION OF ARTERIOVENOUS GORETEX GRAFT - thrombectomy;  Surgeon: Rosetta Posner, MD;  Location: [facility];  Service: Vascular;  Laterality: Right;  . Cardiac catheterization N/A 01/08/2015    Procedure: Temporary Pacemaker;  Surgeon: Sanda Klein, MD;  Location: [facility] INVASIVE CV LAB CUPID;  Service: Cardiovascular;  Laterality: N/A;  . Ep implantable device N/A 01/10/2015    Procedure: Pacemaker Implant;  Surgeon: Deboraha Sprang, MD;  Location: [facility] INVASIVE CV LAB CUPID;  Service: Cardiovascular;  Laterality: N/A;     Current Outpatient Prescriptions  Medication Sig Dispense Refill  . allopurinol (ZYLOPRIM) 300 MG tablet Take 150 mg by mouth daily.     Marland Kitchen amLODipine (NORVASC) 10 MG tablet Take 10 mg by mouth daily.    Marland Kitchen aspirin EC 81 MG tablet Take 81 mg by mouth daily.    . B Complex-C-Folic Acid (RENA-VITE RX) 1 MG TABS TK 1 T PO QD.  3  . b complex-vitamin c-folic acid (NEPHRO-VITE) 0.8 MG TABS Take 0.8 mg by mouth daily.    . cetirizine (ZYRTEC) 10 MG tablet Take 10 mg  by mouth daily.    . cinacalcet (SENSIPAR) 30 MG tablet Take 30 mg by mouth daily with supper.    . Ferric Citrate (AURYXIA) 210 MG TABS Take 2 tablets by mouth 3 (three) times daily.    . insulin NPH-insulin regular (NOVOLIN 70/30) (70-30) 100 UNIT/ML injection Inject 40 Units into the skin 2 (two) times daily.     Marland Kitchen losartan (COZAAR) 100 MG tablet Take 100 mg by mouth as directed. Take 1 tablet on Non-Dialysis days  3  . nitroGLYCERIN (NITROSTAT) 0.4 MG SL tablet Place 1 tablet (0.4 mg total) under the tongue every 5 (five) minutes x 3 doses as needed for chest pain. 25 tablet 12  . omeprazole (PRILOSEC) 20 MG capsule Take 40 mg by mouth daily.     . polyethylene glycol (MIRALAX / GLYCOLAX) packet Take 17 g by mouth  daily.     . sodium chloride (OCEAN) 0.65 % SOLN nasal spray Place 1 spray into the nose daily as needed for congestion.    . VELPHORO 500 MG chewable tablet Chew 500 mg by mouth 3 (three) times daily with meals.     . isosorbide mononitrate (IMDUR) 30 MG 24 hr tablet Take 0.5 tablets (15 mg total) by mouth daily. 30 tablet 11   No current facility-administered medications for this visit.    Allergies:   Review of patient's allergies indicates no known allergies.    Social History:  The patient  reports that he quit smoking about 20 years ago. His smoking use included Cigars. He does not have any smokeless tobacco history on file. He reports that he does not drink alcohol or use illicit drugs.   Family History:  The patient's family history includes Cancer in his brother; Heart disease in his father; Hypertension in his brother and mother; Kidney disease in his father.    ROS:   Please see the history of present illness.   Review of Systems  All other systems reviewed and are negative.     PHYSICAL EXAM: VS:  BP 140/48 mmHg  Pulse 60  Ht 5\' 11"  (1.803 m)  Wt 271 lb 12.8 oz (123.288 kg)  BMI 37.93 kg/m2  SpO2 94%    Wt Readings from Last 3 Encounters:  06/26/15 271  lb 12.8 oz (123.288 kg)  04/13/15 273 lb 12.8 oz (124.195 kg)  03/08/15 269 lb 9.6 oz (122.29 kg)     GEN: Well nourished, well developed, in no acute distress HEENT: normal Neck: no JVD,  no masses Cardiac:  Normal S1/S2, RRR; no murmur ,  no rubs or gallops, trace bilateral LE edema  Respiratory:  Faint crackles at the bases bilaterally, no wheezing, rhonchi  GI: soft, nontender, nondistended  MS: no deformity or atrophy Skin: warm and dry  Neuro:  CNs II-XII intact, Strength and sensation are intact Psych: Normal affect   EKG:  EKG is ordered today.  It demonstrates:   A paced, HR 60, RBBB, down-sloping ST segments aVF, V5-6, QTc 512 ms   Recent Labs: 01/08/2015: Magnesium 2.0; TSH 3.422 01/09/2015: ALT 13*; Hemoglobin 10.6*; Platelets 103* 01/11/2015: BUN 46*; Creatinine, Ser 8.58*; Potassium 4.6; Sodium 140    Lipid Panel No results found for: CHOL, TRIG, HDL, CHOLHDL, VLDL, LDLCALC, LDLDIRECT    ASSESSMENT AND PLAN:  1. Coronary artery disease involving native coronary artery of native heart with angina pectoris:  Patient presents with symptoms consistent with CCS class III angina. He has prior history of stenting to the RCA in 1999. Recent Myoview demonstrated inferolateral and apical scar, EF 45% no ischemia.  Area of scar was new compared to his prior study.  After his rate response was adjusted, the patient had no further chest symptoms and medical therapy was recommended for CAD. Now his symptoms seem to have escalated in the past month. He's taking nitroglycerin with relief of symptoms. His ECG does suggest some lateral ischemic changes. The patient was more concerned about having his rate response turned back on to see if this can alleviate his symptoms. In retrospect, he does not think he felt that bad when the rate response was on.  At this point, I have recommended proceeding with cardiac catheterization to further evaluate his symptoms. I reviewed his case with Dr. Marlou Porch,  who agreed.  Risks and benefits of cardiac catheterization have been discussed with the patient.  These  include bleeding, infection, kidney damage, stroke, heart attack, death.  The patient understands these risks and is willing to proceed. If his LHC does not demonstrate significant obstructive disease, we can further review the rate response issue with Dr. Virl Axe.    -  Arrange LHC with Dr. Quay Burow in the next 1-2 weeks (Tues or Thurs b/c of dialysis)  -  Start Imdur 15 mg QD. Hold on dialysis days.  -  Continue aspirin, amlodipine.  -  He is not on statin therapy for unclear reasons. Consider statin therapy (perform LHC first)  2. Complete heart block s/p Cardiac pacemaker in situ:  As noted, rate response turned has been turned off. If his heart catheterization does not demonstrate any significant, obstructive disease, we can certainly revisit the issue of whether or not to turn rate response back on with Dr. Caryl Comes.   3. ESRD (end stage renal disease):  He is on hemodialysis on Monday Wednesday Friday.  4. Essential hypertension:  Controlled.  5. Diabetes Mellitus:  Hold Novolin 70/30 a.m. of cardiac catheterization.    Medication Adjustments: Current medicines are reviewed at length with the patient today.  Concerns regarding medicines are as outlined above.  The following changes have been made:   Discontinued Medications   POLYETHYLENE GLYCOL POWDER (GLYCOLAX/MIRALAX) POWDER    DISSOLVE 17 GRAMS IN 8 OZ OF WATER.   New Prescriptions   ISOSORBIDE MONONITRATE (IMDUR) 30 MG 24 HR TABLET    Take 0.5 tablets (15 mg total) by mouth daily.   Modified Medications   No medications on file   Labs/ tests ordered today include:  Orders Placed This Encounter  Procedures  . Basic Metabolic Panel (BMET)  . CBC w/Diff  . INR/PT  . EKG 12-Lead     Disposition:    FU Dr. Quay Burow or me 2 weeks post cath.      Signed, Versie Starks, MHS 06/26/2015 4:56 PM      [facility] [address], [place], [place]  [address] Phone: [phone]; Fax: [phone]

## 2015-06-25 NOTE — Telephone Encounter (Signed)
**Note De-Identified Ceara Wrightson Obfuscation** Appointment scheduled for the pt to see Richardson Dopp, PA-c tomorrow at 10:00. The pts wife, Zigmund Daniel, is aware and agrees with date and time of appointment.

## 2015-06-25 NOTE — Telephone Encounter (Signed)
Pt called and sent to device clinic. Pt requesting PPM adjustment for CP and lack of physical endurance. Several times I explained to the patient that 01/30/15 he was seen in clinic for CP, SOB, and dizziness-- Dr. Caryl Comes advised to turn "rate response" feature off (programming change made). 04/13/15 pt was seen by Dr. Caryl Comes- pt reports he wanted rate response turned back on due to lack of physical endurance, RR kept off due to c/o CP in May. Pt reports today that his CP is the same as it was prior to having PPM placed. I explained that since his CP is relieved with nitroglycerin that we need to explore options regarding relief of CP and that PPM adjustment would not likely help with this issue. He continues to state that his opinion is that he PPM needs to be adjusted.  Will route back to triage and have him call wife back if unable to reach due to dialysis. Zigmund Daniel: [phone]  Pt reluctant but agreeable.

## 2015-06-25 NOTE — Telephone Encounter (Signed)
Pt c/o of Chest Pain: STAT if CP now or developed within 24 hours  1. Are you having CP right now? NO  2. Are you experiencing any other symptoms (ex. SOB, nausea, vomiting, sweating)? Headaches, SOB,   3. How long have you been experiencing CP? 10/17  Really bad  4. Is your CP continuous or coming and going? Coming and going  5. Have you taken Nitroglycerin? yes ?

## 2015-06-26 ENCOUNTER — Encounter: Payer: Self-pay | Admitting: Physician Assistant

## 2015-06-26 ENCOUNTER — Encounter: Payer: Self-pay | Admitting: *Deleted

## 2015-06-26 ENCOUNTER — Ambulatory Visit (INDEPENDENT_AMBULATORY_CARE_PROVIDER_SITE_OTHER): Payer: Medicare Other | Admitting: Physician Assistant

## 2015-06-26 VITALS — BP 140/48 | HR 60 | Ht 71.0 in | Wt 271.8 lb

## 2015-06-26 DIAGNOSIS — I25119 Atherosclerotic heart disease of native coronary artery with unspecified angina pectoris: Secondary | ICD-10-CM

## 2015-06-26 DIAGNOSIS — I10 Essential (primary) hypertension: Secondary | ICD-10-CM

## 2015-06-26 DIAGNOSIS — E119 Type 2 diabetes mellitus without complications: Secondary | ICD-10-CM

## 2015-06-26 DIAGNOSIS — I25708 Atherosclerosis of coronary artery bypass graft(s), unspecified, with other forms of angina pectoris: Secondary | ICD-10-CM | POA: Diagnosis not present

## 2015-06-26 DIAGNOSIS — N186 End stage renal disease: Secondary | ICD-10-CM | POA: Diagnosis not present

## 2015-06-26 DIAGNOSIS — Z950 Presence of cardiac pacemaker: Secondary | ICD-10-CM

## 2015-06-26 DIAGNOSIS — R079 Chest pain, unspecified: Secondary | ICD-10-CM | POA: Diagnosis not present

## 2015-06-26 DIAGNOSIS — Z794 Long term (current) use of insulin: Secondary | ICD-10-CM

## 2015-06-26 LAB — BASIC METABOLIC PANEL
BUN: 25 mg/dL (ref 7–25)
CO2: 32 mmol/L — ABNORMAL HIGH (ref 20–31)
Calcium: 9.9 mg/dL (ref 8.6–10.3)
Chloride: 100 mmol/L (ref 98–110)
Creat: 6.1 mg/dL — ABNORMAL HIGH (ref 0.70–1.18)
Glucose, Bld: 105 mg/dL — ABNORMAL HIGH (ref 65–99)
Potassium: 4.2 mmol/L (ref 3.5–5.3)
SODIUM: 144 mmol/L (ref 135–146)

## 2015-06-26 MED ORDER — ISOSORBIDE MONONITRATE ER 30 MG PO TB24: 15.0000 mg | ORAL_TABLET | Freq: Every day | ORAL | Status: AC

## 2015-06-26 NOTE — Patient Instructions (Signed)
Medication Instructions:  1. START IMDUR 30 MG TABLET WITH THE DIRECTIONS TO TAKE 1/2 TAB = 15 MG DAILY; RX SENT  Labwork: TODAY BMET, CBC W/DIFF, PT/INR  Testing/Procedures: Your physician has requested that you have a cardiac catheterization. Cardiac catheterization is used to diagnose and/or treat various heart conditions. Doctors may recommend this procedure for a number of different reasons. The most common reason is to evaluate chest pain. Chest pain can be a symptom of coronary artery disease (CAD), and cardiac catheterization can show whether plaque is narrowing or blocking your heart's arteries. This procedure is also used to evaluate the valves, as well as measure the blood flow and oxygen levels in different parts of your heart. For further information please visit [email or web address]. Please follow instruction sheet, as given.   Follow-Up: 07/12/15 @ 11:30 SCOTT WEAVER, PAC   Any Other Special Instructions Will Be Listed Below (If Applicable).

## 2015-06-27 ENCOUNTER — Telehealth: Payer: Self-pay | Admitting: *Deleted

## 2015-06-27 LAB — PROTIME-INR
INR: 0.94 (ref <1.50)
Prothrombin Time: 12.7 seconds (ref 11.6–15.2)

## 2015-06-27 LAB — CBC WITH DIFFERENTIAL/PLATELET
BASOS ABS: 0 10*3/uL (ref 0.0–0.1)
BASOS PCT: 0 % (ref 0–1)
EOS ABS: 0.2 10*3/uL (ref 0.0–0.7)
Eosinophils Relative: 3 % (ref 0–5)
HEMATOCRIT: 32.8 % — AB (ref 39.0–52.0)
Hemoglobin: 10.7 g/dL — ABNORMAL LOW (ref 13.0–17.0)
Lymphocytes Relative: 16 % (ref 12–46)
Lymphs Abs: 1.1 10*3/uL (ref 0.7–4.0)
MCH: 31.5 pg (ref 26.0–34.0)
MCHC: 32.6 g/dL (ref 30.0–36.0)
MCV: 96.5 fL (ref 78.0–100.0)
MPV: 10.6 fL (ref 8.6–12.4)
Monocytes Absolute: 0.5 10*3/uL (ref 0.1–1.0)
Monocytes Relative: 7 % (ref 3–12)
NEUTROS ABS: 5 10*3/uL (ref 1.7–7.7)
NEUTROS PCT: 74 % (ref 43–77)
PLATELETS: 116 10*3/uL — AB (ref 150–400)
RBC: 3.4 MIL/uL — ABNORMAL LOW (ref 4.22–5.81)
RDW: 15.6 % — AB (ref 11.5–15.5)
WBC: 6.8 10*3/uL (ref 4.0–10.5)

## 2015-06-27 NOTE — Telephone Encounter (Signed)
Lmptcb to go over lab results. Results sent to [facility].

## 2015-06-28 ENCOUNTER — Inpatient Hospital Stay (HOSPITAL_COMMUNITY)
Admission: RE | Admit: 2015-06-28 | Discharge: 2015-07-10 | DRG: 233 | Disposition: E | Payer: Medicare Other | Source: Ambulatory Visit | Attending: Cardiothoracic Surgery | Admitting: Cardiothoracic Surgery

## 2015-06-28 ENCOUNTER — Encounter (HOSPITAL_COMMUNITY): Payer: Self-pay | Admitting: General Practice

## 2015-06-28 ENCOUNTER — Telehealth: Payer: Self-pay | Admitting: Physician Assistant

## 2015-06-28 ENCOUNTER — Encounter (HOSPITAL_COMMUNITY): Admission: RE | Disposition: E | Payer: Self-pay | Source: Ambulatory Visit | Attending: Cardiothoracic Surgery

## 2015-06-28 ENCOUNTER — Other Ambulatory Visit: Payer: Self-pay | Admitting: *Deleted

## 2015-06-28 DIAGNOSIS — E1122 Type 2 diabetes mellitus with diabetic chronic kidney disease: Secondary | ICD-10-CM | POA: Diagnosis present

## 2015-06-28 DIAGNOSIS — Z66 Do not resuscitate: Secondary | ICD-10-CM

## 2015-06-28 DIAGNOSIS — Z7982 Long term (current) use of aspirin: Secondary | ICD-10-CM

## 2015-06-28 DIAGNOSIS — I2584 Coronary atherosclerosis due to calcified coronary lesion: Secondary | ICD-10-CM | POA: Diagnosis present

## 2015-06-28 DIAGNOSIS — I200 Unstable angina: Secondary | ICD-10-CM

## 2015-06-28 DIAGNOSIS — Z87891 Personal history of nicotine dependence: Secondary | ICD-10-CM

## 2015-06-28 DIAGNOSIS — H5441 Blindness, right eye, normal vision left eye: Secondary | ICD-10-CM | POA: Diagnosis present

## 2015-06-28 DIAGNOSIS — N12 Tubulo-interstitial nephritis, not specified as acute or chronic: Secondary | ICD-10-CM | POA: Diagnosis not present

## 2015-06-28 DIAGNOSIS — G9340 Encephalopathy, unspecified: Secondary | ICD-10-CM

## 2015-06-28 DIAGNOSIS — N189 Chronic kidney disease, unspecified: Secondary | ICD-10-CM

## 2015-06-28 DIAGNOSIS — N2581 Secondary hyperparathyroidism of renal origin: Secondary | ICD-10-CM | POA: Diagnosis present

## 2015-06-28 DIAGNOSIS — K4090 Unilateral inguinal hernia, without obstruction or gangrene, not specified as recurrent: Secondary | ICD-10-CM | POA: Diagnosis not present

## 2015-06-28 DIAGNOSIS — I25708 Atherosclerosis of coronary artery bypass graft(s), unspecified, with other forms of angina pectoris: Secondary | ICD-10-CM

## 2015-06-28 DIAGNOSIS — R197 Diarrhea, unspecified: Secondary | ICD-10-CM | POA: Diagnosis not present

## 2015-06-28 DIAGNOSIS — K567 Ileus, unspecified: Secondary | ICD-10-CM | POA: Diagnosis not present

## 2015-06-28 DIAGNOSIS — K559 Vascular disorder of intestine, unspecified: Secondary | ICD-10-CM

## 2015-06-28 DIAGNOSIS — Z794 Long term (current) use of insulin: Secondary | ICD-10-CM

## 2015-06-28 DIAGNOSIS — I2510 Atherosclerotic heart disease of native coronary artery without angina pectoris: Secondary | ICD-10-CM

## 2015-06-28 DIAGNOSIS — J9811 Atelectasis: Secondary | ICD-10-CM

## 2015-06-28 DIAGNOSIS — Z6838 Body mass index (BMI) 38.0-38.9, adult: Secondary | ICD-10-CM

## 2015-06-28 DIAGNOSIS — Z515 Encounter for palliative care: Secondary | ICD-10-CM | POA: Diagnosis not present

## 2015-06-28 DIAGNOSIS — R0603 Acute respiratory distress: Secondary | ICD-10-CM

## 2015-06-28 DIAGNOSIS — D631 Anemia in chronic kidney disease: Secondary | ICD-10-CM | POA: Diagnosis present

## 2015-06-28 DIAGNOSIS — K219 Gastro-esophageal reflux disease without esophagitis: Secondary | ICD-10-CM | POA: Diagnosis present

## 2015-06-28 DIAGNOSIS — I10 Essential (primary) hypertension: Secondary | ICD-10-CM | POA: Diagnosis present

## 2015-06-28 DIAGNOSIS — Z950 Presence of cardiac pacemaker: Secondary | ICD-10-CM

## 2015-06-28 DIAGNOSIS — E872 Acidosis, unspecified: Secondary | ICD-10-CM

## 2015-06-28 DIAGNOSIS — E785 Hyperlipidemia, unspecified: Secondary | ICD-10-CM | POA: Diagnosis present

## 2015-06-28 DIAGNOSIS — K761 Chronic passive congestion of liver: Secondary | ICD-10-CM | POA: Diagnosis not present

## 2015-06-28 DIAGNOSIS — A419 Sepsis, unspecified organism: Secondary | ICD-10-CM

## 2015-06-28 DIAGNOSIS — K7200 Acute and subacute hepatic failure without coma: Secondary | ICD-10-CM | POA: Diagnosis not present

## 2015-06-28 DIAGNOSIS — I442 Atrioventricular block, complete: Secondary | ICD-10-CM | POA: Diagnosis present

## 2015-06-28 DIAGNOSIS — R740 Nonspecific elevation of levels of transaminase and lactic acid dehydrogenase [LDH]: Secondary | ICD-10-CM

## 2015-06-28 DIAGNOSIS — E875 Hyperkalemia: Secondary | ICD-10-CM | POA: Diagnosis not present

## 2015-06-28 DIAGNOSIS — I480 Paroxysmal atrial fibrillation: Secondary | ICD-10-CM | POA: Diagnosis not present

## 2015-06-28 DIAGNOSIS — G9341 Metabolic encephalopathy: Secondary | ICD-10-CM | POA: Diagnosis not present

## 2015-06-28 DIAGNOSIS — Z01818 Encounter for other preprocedural examination: Secondary | ICD-10-CM

## 2015-06-28 DIAGNOSIS — E119 Type 2 diabetes mellitus without complications: Secondary | ICD-10-CM

## 2015-06-28 DIAGNOSIS — Z955 Presence of coronary angioplasty implant and graft: Secondary | ICD-10-CM

## 2015-06-28 DIAGNOSIS — N186 End stage renal disease: Secondary | ICD-10-CM | POA: Diagnosis present

## 2015-06-28 DIAGNOSIS — Y95 Nosocomial condition: Secondary | ICD-10-CM | POA: Diagnosis not present

## 2015-06-28 DIAGNOSIS — R079 Chest pain, unspecified: Secondary | ICD-10-CM | POA: Insufficient documentation

## 2015-06-28 DIAGNOSIS — Z9689 Presence of other specified functional implants: Secondary | ICD-10-CM

## 2015-06-28 DIAGNOSIS — K55069 Acute infarction of intestine, part and extent unspecified: Secondary | ICD-10-CM | POA: Diagnosis not present

## 2015-06-28 DIAGNOSIS — R410 Disorientation, unspecified: Secondary | ICD-10-CM

## 2015-06-28 DIAGNOSIS — J9601 Acute respiratory failure with hypoxia: Secondary | ICD-10-CM | POA: Insufficient documentation

## 2015-06-28 DIAGNOSIS — R7401 Elevation of levels of liver transaminase levels: Secondary | ICD-10-CM

## 2015-06-28 DIAGNOSIS — I25110 Atherosclerotic heart disease of native coronary artery with unstable angina pectoris: Secondary | ICD-10-CM

## 2015-06-28 DIAGNOSIS — A4152 Sepsis due to Pseudomonas: Secondary | ICD-10-CM | POA: Diagnosis not present

## 2015-06-28 DIAGNOSIS — G4733 Obstructive sleep apnea (adult) (pediatric): Secondary | ICD-10-CM | POA: Diagnosis present

## 2015-06-28 DIAGNOSIS — I2583 Coronary atherosclerosis due to lipid rich plaque: Secondary | ICD-10-CM | POA: Diagnosis present

## 2015-06-28 DIAGNOSIS — Z951 Presence of aortocoronary bypass graft: Secondary | ICD-10-CM | POA: Insufficient documentation

## 2015-06-28 DIAGNOSIS — I5042 Chronic combined systolic (congestive) and diastolic (congestive) heart failure: Secondary | ICD-10-CM | POA: Diagnosis not present

## 2015-06-28 DIAGNOSIS — D62 Acute posthemorrhagic anemia: Secondary | ICD-10-CM | POA: Diagnosis not present

## 2015-06-28 DIAGNOSIS — Z79899 Other long term (current) drug therapy: Secondary | ICD-10-CM

## 2015-06-28 DIAGNOSIS — J189 Pneumonia, unspecified organism: Secondary | ICD-10-CM | POA: Diagnosis not present

## 2015-06-28 DIAGNOSIS — R6521 Severe sepsis with septic shock: Secondary | ICD-10-CM | POA: Diagnosis not present

## 2015-06-28 DIAGNOSIS — D696 Thrombocytopenia, unspecified: Secondary | ICD-10-CM | POA: Diagnosis present

## 2015-06-28 DIAGNOSIS — Z992 Dependence on renal dialysis: Secondary | ICD-10-CM

## 2015-06-28 DIAGNOSIS — M898X9 Other specified disorders of bone, unspecified site: Secondary | ICD-10-CM | POA: Diagnosis present

## 2015-06-28 DIAGNOSIS — I132 Hypertensive heart and chronic kidney disease with heart failure and with stage 5 chronic kidney disease, or end stage renal disease: Secondary | ICD-10-CM | POA: Diagnosis present

## 2015-06-28 DIAGNOSIS — I639 Cerebral infarction, unspecified: Secondary | ICD-10-CM | POA: Diagnosis not present

## 2015-06-28 HISTORY — DX: Obstructive sleep apnea (adult) (pediatric): G47.33

## 2015-06-28 HISTORY — DX: Dependence on other enabling machines and devices: Z99.89

## 2015-06-28 HISTORY — DX: Dependence on renal dialysis: N18.6

## 2015-06-28 HISTORY — DX: Presence of cardiac pacemaker: Z95.0

## 2015-06-28 HISTORY — DX: End stage renal disease: Z99.2

## 2015-06-28 HISTORY — DX: Type 2 diabetes mellitus without complications: E11.9

## 2015-06-28 HISTORY — PX: CARDIAC CATHETERIZATION: SHX172

## 2015-06-28 LAB — GLUCOSE, CAPILLARY
GLUCOSE-CAPILLARY: 145 mg/dL — AB (ref 65–99)
Glucose-Capillary: 116 mg/dL — ABNORMAL HIGH (ref 65–99)
Glucose-Capillary: 178 mg/dL — ABNORMAL HIGH (ref 65–99)

## 2015-06-28 SURGERY — LEFT HEART CATH AND CORONARY ANGIOGRAPHY
Anesthesia: LOCAL

## 2015-06-28 MED ORDER — SODIUM CHLORIDE 0.9 % IV SOLN
250.0000 mL | INTRAVENOUS | Status: DC | PRN
  Administered 2015-06-30: 10 mL via INTRAVENOUS

## 2015-06-28 MED ORDER — HEPARIN (PORCINE) IN NACL 2-0.9 UNIT/ML-% IJ SOLN
INTRAMUSCULAR | Status: AC
  Filled 2015-06-28: qty 1000

## 2015-06-28 MED ORDER — FERROUS SULFATE 325 (65 FE) MG PO TABS
325.0000 mg | ORAL_TABLET | Freq: Three times a day (TID) | ORAL | Status: DC
  Administered 2015-06-29 – 2015-06-30 (×5): 325 mg via ORAL
  Filled 2015-06-28 (×5): qty 1

## 2015-06-28 MED ORDER — HYDRALAZINE HCL 20 MG/ML IJ SOLN: 10.0000 mg | INTRAMUSCULAR | Status: DC | PRN

## 2015-06-28 MED ORDER — LIDOCAINE HCL (PF) 1 % IJ SOLN
INTRAMUSCULAR | Status: AC
  Filled 2015-06-28: qty 30

## 2015-06-28 MED ORDER — NITROGLYCERIN 0.4 MG SL SUBL: 0.4000 mg | SUBLINGUAL_TABLET | SUBLINGUAL | Status: DC | PRN

## 2015-06-28 MED ORDER — ATORVASTATIN CALCIUM 80 MG PO TABS
80.0000 mg | ORAL_TABLET | Freq: Every day | ORAL | Status: DC
  Administered 2015-06-28 – 2015-07-04 (×6): 80 mg via ORAL
  Filled 2015-06-28 (×10): qty 1

## 2015-06-28 MED ORDER — ALLOPURINOL 150 MG HALF TABLET
150.0000 mg | ORAL_TABLET | Freq: Every day | ORAL | Status: DC
  Administered 2015-06-29 – 2015-07-05 (×5): 150 mg via ORAL
  Filled 2015-06-28 (×7): qty 1

## 2015-06-28 MED ORDER — ASPIRIN 81 MG PO CHEW: 81.0000 mg | CHEWABLE_TABLET | ORAL | Status: DC

## 2015-06-28 MED ORDER — SODIUM CHLORIDE 0.9 % IJ SOLN: 3.0000 mL | INTRAMUSCULAR | Status: DC | PRN

## 2015-06-28 MED ORDER — IOHEXOL 350 MG/ML SOLN
INTRAVENOUS | Status: DC | PRN
  Administered 2015-06-28: 80 mL via INTRA_ARTERIAL

## 2015-06-28 MED ORDER — MIDAZOLAM HCL 2 MG/2ML IJ SOLN
INTRAMUSCULAR | Status: DC | PRN
  Administered 2015-06-28: 1 mg via INTRAVENOUS

## 2015-06-28 MED ORDER — SALINE SPRAY 0.65 % NA SOLN
1.0000 | Freq: Every day | NASAL | Status: DC | PRN
  Filled 2015-06-28: qty 44

## 2015-06-28 MED ORDER — RENA-VITE PO TABS
1.0000 | ORAL_TABLET | Freq: Every day | ORAL | Status: DC
  Administered 2015-06-29 – 2015-07-04 (×5): 1 via ORAL
  Filled 2015-06-28 (×8): qty 1

## 2015-06-28 MED ORDER — CINACALCET HCL 30 MG PO TABS
30.0000 mg | ORAL_TABLET | Freq: Every day | ORAL | Status: DC
  Administered 2015-06-28 – 2015-07-02 (×5): 30 mg via ORAL
  Filled 2015-06-28 (×9): qty 1

## 2015-06-28 MED ORDER — LORATADINE 10 MG PO TABS
10.0000 mg | ORAL_TABLET | Freq: Every day | ORAL | Status: DC
  Administered 2015-06-28 – 2015-07-05 (×6): 10 mg via ORAL
  Filled 2015-06-28 (×8): qty 1

## 2015-06-28 MED ORDER — ACETAMINOPHEN 325 MG PO TABS
650.0000 mg | ORAL_TABLET | ORAL | Status: DC | PRN
  Administered 2015-06-29 – 2015-07-02 (×2): 650 mg via ORAL
  Filled 2015-06-28: qty 2

## 2015-06-28 MED ORDER — MIDAZOLAM HCL 2 MG/2ML IJ SOLN
INTRAMUSCULAR | Status: AC
Start: 2015-06-28 — End: 2015-06-28
  Filled 2015-06-28: qty 4

## 2015-06-28 MED ORDER — HYDRALAZINE HCL 20 MG/ML IJ SOLN
INTRAMUSCULAR | Status: DC | PRN
  Administered 2015-06-28: 10 mg via INTRAVENOUS

## 2015-06-28 MED ORDER — LOSARTAN POTASSIUM 50 MG PO TABS
100.0000 mg | ORAL_TABLET | ORAL | Status: DC
  Administered 2015-06-30 – 2015-07-01 (×2): 100 mg via ORAL
  Filled 2015-06-28 (×2): qty 2

## 2015-06-28 MED ORDER — ONDANSETRON HCL 4 MG/2ML IJ SOLN: 4.0000 mg | Freq: Four times a day (QID) | INTRAMUSCULAR | Status: DC | PRN

## 2015-06-28 MED ORDER — FENTANYL CITRATE (PF) 100 MCG/2ML IJ SOLN
INTRAMUSCULAR | Status: AC
  Filled 2015-06-28: qty 4

## 2015-06-28 MED ORDER — SODIUM CHLORIDE 0.9 % IV SOLN: INTRAVENOUS | Status: DC

## 2015-06-28 MED ORDER — HEPARIN (PORCINE) IN NACL 2-0.9 UNIT/ML-% IJ SOLN
INTRAMUSCULAR | Status: DC | PRN
  Administered 2015-06-28: 15:00:00

## 2015-06-28 MED ORDER — ASPIRIN EC 81 MG PO TBEC
81.0000 mg | DELAYED_RELEASE_TABLET | Freq: Every day | ORAL | Status: DC
  Administered 2015-06-29 – 2015-07-02 (×4): 81 mg via ORAL
  Filled 2015-06-28 (×4): qty 1

## 2015-06-28 MED ORDER — SODIUM CHLORIDE 0.9 % WEIGHT BASED INFUSION: 3.0000 mL/kg/h | INTRAVENOUS | Status: DC

## 2015-06-28 MED ORDER — ISOSORBIDE MONONITRATE ER 30 MG PO TB24
15.0000 mg | ORAL_TABLET | Freq: Every day | ORAL | Status: DC
  Administered 2015-06-29: 11:00:00 15 mg via ORAL
  Filled 2015-06-28 (×2): qty 1

## 2015-06-28 MED ORDER — AMLODIPINE BESYLATE 10 MG PO TABS
10.0000 mg | ORAL_TABLET | Freq: Every day | ORAL | Status: DC
  Administered 2015-06-28 – 2015-07-02 (×5): 10 mg via ORAL
  Filled 2015-06-28 (×5): qty 1

## 2015-06-28 MED ORDER — NITROGLYCERIN IN D5W 200-5 MCG/ML-% IV SOLN
5.0000 ug/min | INTRAVENOUS | Status: DC
  Administered 2015-06-28: 10 ug/min via INTRAVENOUS
  Filled 2015-06-28: qty 250

## 2015-06-28 MED ORDER — POLYETHYLENE GLYCOL 3350 17 G PO PACK
17.0000 g | PACK | Freq: Every day | ORAL | Status: DC | PRN
  Administered 2015-06-30: 09:00:00 17 g via ORAL
  Filled 2015-06-28: qty 1

## 2015-06-28 MED ORDER — BUTENAFINE HCL 1 % EX CREA: TOPICAL_CREAM | CUTANEOUS | Status: DC | PRN

## 2015-06-28 MED ORDER — FENTANYL CITRATE (PF) 100 MCG/2ML IJ SOLN
INTRAMUSCULAR | Status: DC | PRN
  Administered 2015-06-28: 25 ug via INTRAVENOUS

## 2015-06-28 MED ORDER — SODIUM CHLORIDE 0.9 % IV SOLN: 250.0000 mL | INTRAVENOUS | Status: DC | PRN

## 2015-06-28 MED ORDER — PANTOPRAZOLE SODIUM 40 MG PO TBEC
80.0000 mg | DELAYED_RELEASE_TABLET | Freq: Every day | ORAL | Status: DC
  Administered 2015-06-29 – 2015-07-02 (×4): 80 mg via ORAL
  Filled 2015-06-28 (×4): qty 2

## 2015-06-28 MED ORDER — SODIUM CHLORIDE 0.9 % IJ SOLN: 3.0000 mL | Freq: Two times a day (BID) | INTRAMUSCULAR | Status: DC

## 2015-06-28 MED ORDER — MORPHINE SULFATE (PF) 2 MG/ML IV SOLN
2.0000 mg | INTRAVENOUS | Status: DC | PRN
  Administered 2015-06-29: 2 mg via INTRAVENOUS
  Filled 2015-06-28: qty 1

## 2015-06-28 MED ORDER — NITROGLYCERIN IN D5W 200-5 MCG/ML-% IV SOLN
INTRAVENOUS | Status: AC
  Filled 2015-06-28: qty 250

## 2015-06-28 MED ORDER — FERRIC CITRATE 1 GM 210 MG(FE) PO TABS: 2.0000 | ORAL_TABLET | Freq: Three times a day (TID) | ORAL | Status: DC

## 2015-06-28 SURGICAL SUPPLY — 10 items
CATH INFINITI 5FR MULTPACK ANG (CATHETERS) ×2 IMPLANT
KIT HEART LEFT (KITS) ×2 IMPLANT
PACK CARDIAC CATHETERIZATION (CUSTOM PROCEDURE TRAY) ×2 IMPLANT
SHEATH PINNACLE 5F 10CM (SHEATH) ×2 IMPLANT
SYR MEDRAD MARK V 150ML (SYRINGE) ×2 IMPLANT
TRANSDUCER W/STOPCOCK (MISCELLANEOUS) ×2 IMPLANT
TUBING CIL FLEX 10 FLL-RA (TUBING) ×2 IMPLANT
WIRE EMERALD 3MM-J .035X150CM (WIRE) ×2 IMPLANT
WIRE HI TORQ VERSACORE-J 145CM (WIRE) ×2 IMPLANT
WIRE SAFE-T 1.5MM-J .035X260CM (WIRE) ×2 IMPLANT

## 2015-06-28 NOTE — Telephone Encounter (Signed)
New problem    Pt returning your call from last night,

## 2015-06-28 NOTE — Consult Note (Signed)
[address]       [place] [address]             [phone]        Antonio Page  Medical Record #[id number] Date of Birth: 1940-12-30  Referring: Dr. Gwenlyn Found Primary Care: Geoffery Lyons, MD EP Physician: Dr. Caryl Comes  Chief Complaint:   Chest pain, history of CAD   History of Present Illness:     This is a 74 year old male with a history of coronary artery disease (s/p PCI with stent to RCA in 99'), hypertension, diabetes, ESRD, remote tobacco abuse, OSA ( CPAP), CHB (s/p PPM 5/16'), and anemia of chronic disease. According to medical records, he was last admitted in May 16' with multiple episodes of syncope, hyperkalemia (K 6.2), and intermittent third degree heart block. He had a PPM placed. Echo done 5/16' showed LVEF 55-60%, mild MR, no AS/AI, and no pericardial effusion. He then had a myoview stress test on 02/20/15 that showed inferolateral and apical infarct, no ischemia, EF 45, and an intermediate risk. He has been having substernal chest discomfort/pain with exertion (NOT at rest) and was seen by Richardson Dopp PA-C on 06/25/15. AN EKG showed A pacing, HR 60, RBBB, down sloping of ST segments in AVF, V5-6, and QTc 512 ms. He was started on Imdur 15 mg at bedtime (except dialysis days), consideration for starting a statin, and arrangement were made for a left heart catheterization. He underwent a left heart catheterization and was found to have  80% left main stenosis, 95% ostial circumflex, and LVEF was "normal". Dr. Servando Snare was consulted for consideration of coronary artery bypass grafting surgery. Currently, his HR is in the 60's, BP 132/29, oxygen saturation 92% on 2 liters of oxygen via [place]. He has complaints of sternal discomfort that is the same as he has had at home before. He denies shortness of breath, nausea, diaphoresis, syncope.  Current Activity/ Functional Status: Patient is independent with mobility/ambulation, transfers, ADL's,  IADL's.   Zubrod Score: At the time of surgery this patient's most appropriate activity status/level should be described as: []     0    Normal activity, no symptoms []     1    Restricted in physical strenuous activity but ambulatory, able to do out light work [x]     2    Ambulatory and capable of self care, unable to do work activities, up and about more than 50% of the time                      []     3    Only limited self care, in bed greater than 50% of waking hours []     4    Completely disabled, no self care, confined to bed or chair []     5    Moribund  Past Medical History  Diagnosis Date  . Hypertension   . Sleep apnea     uses a cpap  . Diabetes mellitus   . GERD (gastroesophageal reflux disease)   . Arthritis   . Glaucoma   . Dialysis patient ([facility])   . Blind right eye   . Chronic kidney disease     dialysis  . Kidney stones   . Coronary artery disease     hx of stent to RCA  . Hypoxia 01/11/2015  . CHB (complete heart block) ([facility]) 01/08/15    PPM placed MDT  .  Hx of cardiovascular stress test     Lexiscan Myoview 6/16:  Myocardial perfusion is abnormal. Findings consistent with prior myocardial infarction. This is an intermediate risk study. Overall left ventricular systolic function was abnormal. The left ventricular ejection fraction is mildly decreased (45-54%).   Questionable sarcoidosis  Past Surgical History  Procedure Laterality Date  . Dg av dialysis  shunt access exist*l* or  2010    left upper arm-not using  . Dg av dialysis  shunt access exist*l* or  2012    right upper arm-using this one  . Shoulder arthroscopy      right  . Knee arthroscopy      left  . Tonsillectomy    . Eye surgery      multiple rt eye surgeries  . Coronary stent placement   1999     RCA  . Trigger finger release  06/08/2012    Procedure: RELEASE TRIGGER FINGER/A-1 PULLEY;  Surgeon: Cammie Sickle., MD;  Location: [facility];  Service: Orthopedics;  Laterality:  Right;  . Dupuytren contracture release  06/08/2012    Procedure: DUPUYTREN CONTRACTURE RELEASE;  Surgeon: Cammie Sickle., MD;  Location: [facility];  Service: Orthopedics;  Laterality: Right;  right ring fascia excision with A1 Pulley right ring  . Cystoscopy with retrograde pyelogram, ureteroscopy and stent placement  07/21/2012    Procedure: [place], URETEROSCOPY AND STENT PLACEMENT;  Surgeon: Malka So, MD;  Location: [facility];  Service: Urology;  Laterality: Right;  . 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 Constance Holster   . Tee without cardioversion N/A 03/31/2013    Procedure: TRANSESOPHAGEAL ECHOCARDIOGRAM (TEE);  Surgeon: Sanda Klein, MD;  [facility]: [facility];  Service: Cardiovascular;  Laterality: N/A;  . Revision of arteriovenous goretex graft Right 10/19/2013    Procedure: REVISION OF ARTERIOVENOUS GORETEX GRAFT - thrombectomy;  Surgeon: Rosetta Posner, MD;  Location: [facility];  Service: Vascular;  Laterality: Right;  . Cardiac catheterization N/A 01/08/2015    Procedure: Temporary Pacemaker;  Surgeon: Sanda Klein, MD;  Location: [facility] INVASIVE CV LAB CUPID;  Service: Cardiovascular;  Laterality: N/A;  . Ep implantable device N/A 01/10/2015    Procedure: Pacemaker Implant;  Surgeon: Deboraha Sprang, MD;  Location: [facility] INVAS[facility] CV LAB [facility];  Service: Cardiovascular;  Laterality: N/A;  Mediastinoscopy  Social History   Social History  . Marital Status: Married    Spouse Name: N/A  . Number of Children: N/A  . Years of Education: N/A   Occupational History  . Retired    Social History Main Topics  . Smoking status: Former Smoker    Types: Cigars    Quit date: 06/08/1995  . Smokeless tobacco: Not on file  . Alcohol Use: No  . Drug Use: No    . Sexual Activity: Not on file   Social History Narrative   Lives in [place] with wife. He has 4 sons who live locally.   Allergies: No Known Allergies  Current Facility-Administered Medications  Medication Dose Route Frequency Provider Last Rate Last Dose  . 0.9 %  sodium chloride infusion  250 mL Intravenous PRN Lorretta Harp, MD      . acetaminophen (TYLENOL) tablet 650 mg  650  mg Oral Q4H PRN Lorretta Harp, MD      . atorvastatin (LIPITOR) tablet 80 mg  80 mg Oral q1800 Lorretta Harp, MD      . hydrALAZINE (APRESOLINE) injection 10 mg  10 mg Intravenous Q4H PRN Lorretta Harp, MD      . morphine 2 MG/ML injection 2 mg  2 mg Intravenous Q1H PRN Lorretta Harp, MD      . nitroGLYCERIN 50 mg in dextrose 5 % 250 mL (0.2 mg/mL) infusion  5-200 mcg/min Intravenous Titrated Lorretta Harp, MD 3 mL/hr at 07/07/2015 1632 10 mcg/min at 06/20/2015 1632  . ondansetron (ZOFRAN) injection 4 mg  4 mg Intravenous Q6H PRN Lorretta Harp, MD      . sodium chloride 0.9 % injection 3 mL  3 mL Intravenous PRN Lorretta Harp, MD        Prescriptions prior to admission  Medication Sig Dispense Refill Last Dose  . allopurinol (ZYLOPRIM) 300 MG tablet Take 150 mg by mouth daily.    06/20/2015 at Unknown time  . amLODipine (NORVASC) 10 MG tablet Take 10 mg by mouth daily.   06/27/2015 at Unknown time  . aspirin EC 81 MG tablet Take 81 mg by mouth daily.   06/15/2015 at 0800  . b complex-vitamin c-folic acid (NEPHRO-VITE) 0.8 MG TABS Take 0.8 mg by mouth daily.   06/20/2015 at Unknown time  . Butenafine HCl (LOTRIMIN ULTRA EX) Apply 1 application topically as needed (itching).   06/27/2015 at Unknown time  . cetirizine (ZYRTEC) 10 MG tablet Take 10 mg by mouth daily.   06/27/2015 at Unknown time  . cinacalcet (SENSIPAR) 30 MG tablet Take 30 mg by mouth daily with supper.   06/27/2015 at Unknown time  . Ferric Citrate (AURYXIA) 210 MG TABS Take 2 tablets by mouth 3 (three) times daily.    06/27/2015 at Unknown time  . insulin NPH-insulin regular (NOVOLIN 70/30) (70-30) 100 UNIT/ML injection Inject 40 Units into the skin 2 (two) times daily.    06/27/2015 at Unknown time  . isosorbide mononitrate (IMDUR) 30 MG 24 hr tablet Take 0.5 tablets (15 mg total) by mouth daily. 30 tablet 11 06/27/2015 at Unknown time  . losartan (COZAAR) 100 MG tablet Take 100 mg by mouth as directed. Take 1 tablet on Non-Dialysis days  3 Past Week at Unknown time  . nitroGLYCERIN (NITROSTAT) 0.4 MG SL tablet Place 1 tablet (0.4 mg total) under the tongue every 5 (five) minutes x 3 doses as needed for chest pain. 25 tablet 12 Past Week at Unknown time  . omeprazole (PRILOSEC) 20 MG capsule Take 40 mg by mouth daily.    07/05/2015 at Unknown time  . polyethylene glycol (MIRALAX / GLYCOLAX) packet Take 17 g by mouth daily as needed for moderate constipation.    06/27/2015 at Unknown time  . sodium chloride (OCEAN) 0.65 % SOLN nasal spray Place 1 spray into the nose daily as needed for congestion.   06/27/2015 at Unknown time    Family History  Problem Relation Age of Onset  . Heart disease Father   . Kidney disease Father   . Hypertension Mother   . Cancer Brother   . Hypertension Brother    Review of Systems:     Cardiac Review of Systems: Y or N  Chest Pain [  Y  ]  Resting SOB [ N  ] Exertional SOB  [ N ]  Orthopnea [ N ]  Pedal Edema [  N ]    Palpitations [ N ] Syncope  [place].Slicker  ]   Presyncope [ N  ]  General Review of Systems: [Y] = yes [  ]=no Constitional: recent weight change [  ]; anorexia [  ]; fatigue [  ]; nausea [  ]; night sweats [  ]; fever [  ]; or chills [  ]                                                                Eye : blurred vision [ N ]; diplopia [ N  ]; blind [ RIGHT eye ];  Amaurosis fugax[ N ]; Resp: cough [ N ];  wheezing[ N ];  hemoptysis[ N ];  GI: vomiting[ N ];  dysphagia[ N ]; melena[N  ];  hematochezia [ N ]; heartburn[Y  ];    GU: kidney stones [ Y ]; hematuria[N   ];   dysuria Aqua.Slicker  ];               Skin: rash, swelling[N  ];, hair loss[ Y ];    or itching[ N ]; Musculosketetal: myalgias[ N ]; ;  joint erythema[N  ];   Heme/Lymph: bruising[N  ];  anemia[ Y ];  Neuro: TIA[  N];  headaches[ Y ];  stroke[ N ];  vertigo[N  ];  seizures[  N];     Psych:depression[N  ]; anxiety[ N ];  Endocrine: diabetes[Y  ];  thyroid dysfunction[  N];    Physical Exam: BP 150/52 mmHg  Pulse 59  Temp(Src) 97.9 F (36.6 C) (Oral)  Resp 10  Ht 5\' 11"  (1.803 m)  Wt 275 lb (124.739 kg)  BMI 38.37 kg/m2  SpO2 100%   General appearance: alert, cooperative and no distress Head: Normocephalic, without obvious abnormality, atraumatic Neck: no carotid bruit, no JVD and supple, symmetrical, trachea midline Resp: clear to auscultation bilaterally Cardio: regular rate and rhythm and no murmur or rub GI: Soft, obese, non tender, bowel sounds present Extremities: Palpable DP bilaterally, trace LE edema. Chronic venous stasis changes and varicosities posterior calves Neurologic: Grossly normal. Blind right eye (DM)  Diagnostic Studies & Laboratory data:     Recent Radiology Findings:  CT ANGIOGRAPHY CHEST WITH CONTRAST on 01/08/2015: FINDINGS: Cardiovascular: There is good opacification of the pulmonary arteries. There is no pulmonary embolism. The thoracic aorta is normal in caliber and intact. There is extensive calcified coronary artery plaque.  Lungs: There is interlobular septal thickening consistent with interstitial fluid. There are ground-glass opacities which likely represent alveolar edema. The findings are most suggestive of congestive heart failure. Mild atelectatic-appearing airspace opacity is present in the dependent posterior bases bilaterally.  Central airways: Patent  Effusions: Very small effusions bilaterally.  Lymphadenopathy: There are a few nonspecific nodes in the mediastinum. No pathologic adenopathy is evident.  Esophagus:  Unremarkable  Upper abdomen: A transvenous lead extends up the IVC and into the right ventricle, presumably a temporary pacer.  Musculoskeletal: No acute musculoskeletal abnormalities.  Review of the MIP images confirms the above findings.  IMPRESSION: *Negative for pulmonary embolism *Interstitial and alveolar edema consistent with congestive heart failure. Very small pleural effusions bilaterally. *Extensive calcified coronary artery plaque.    I have independently reviewed the above  radiologic studies.  Recent Lab Findings: Lab Results  Component Value Date   WBC 6.8 06/26/2015   HGB 10.7* 06/26/2015   HCT 32.8* 06/26/2015   PLT 116* 06/26/2015   GLUCOSE 105* 06/26/2015   ALT 13* 01/09/2015   AST 16 01/09/2015   NA 144 06/26/2015   K 4.2 06/26/2015   CL 100 06/26/2015   CREATININE 6.10* 06/26/2015   BUN 25 06/26/2015   CO2 32* 06/26/2015   TSH 3.422 01/08/2015   INR 0.94 06/26/2015   HGBA1C 6.0* 01/08/2015   Cath: Procedures    Left Heart Cath and Coronary Angiography    Conclusion     LM lesion, 80% stenosed.  Ost Cx lesion, 95% stenosed.  Mid Cx lesion, 50% stenosed.  The left ventricular systolic function is normal.  Antonio Page is a 74 y.o. male    Echo:  Study Conclusions  - Left ventricle: The cavity size was normal. Wall thickness was increased in a pattern of mild LVH. Systolic function was normal. The estimated ejection fraction was in the range of 55% to 60%. Wall motion was normal; there were no regional wall motion abnormalities. - Mitral valve: Calcified annulus. There was mild regurgitation. - Left atrium: The atrium was moderately dilated.  Impressions:  - Incomplete study. Study was terminated secondary to periods of asystole.  Assessment / Plan:   1. CAD-has 80% left main disease included. Needs coronary artery bypass grafting surgery 2.ESRD-HD days are M/W/Fri 3. Essential hypertension-on Norvasc  10 mg daily, Imdur 15 mg daily, Nitro drip, Losartan 100 mg daily. 4.DM-CBGs 145/116/178/156. On Insulin. 5. Anemia of chronic disease-H and H this am 9 and 27.8 6. Thrombocytopenia-platelets down to 87,000  History of sarcoid I have reviewed films today with Dr Lonia Blood who concurs presence of left main disease  Plan to proceed with CABG on Tuesday after dialysis on Monday  Discussed risks and options with the patient and family.   I have seen and examined Antonio Page and agree with the above assessment  and plan.  Grace Isaac MD Beeper [phone] Office [phone] 06/29/2015 9:12 PM

## 2015-06-28 NOTE — Progress Notes (Addendum)
Site area: right groin  Site Prior to Removal:  Level 0 Pressure Applied For:25 minutes Manual:   yes Patient Status During Pull:  stable Post Pull Site:  Level 1 Post Pull Instructions Given:  yes Post Pull Pulses Present: dp +2 Dressing Applied: occlusive   Bedrest begins @ 1610 Comments: resting comfortably

## 2015-06-28 NOTE — H&P (View-Only) (Signed)
Cardiology Office Note   Date:  06/26/2015   ID:  Antonio Page, DOB 1941-04-06, MRN [id number]  PCP:  Geoffery Lyons, MD  Cardiologist:  Dr. Quay Burow   Electrophysiologist:  Dr. Virl Axe    Chief Complaint  Patient presents with  . Chest Pain  . Coronary Artery Disease     History of Present Illness: Antonio Page is a 74 y.o. male with a hx of CAD status post stenting of the RCA in 1999, HTN, diabetes, ESRD, questionable sarcoidosis, OSA, anemia of chronic disease. Last seen by Dr. Gwenlyn Found in 2014  Admitted 01/2015 after presenting to the emergency room with multiple episodes of syncope in the setting of hyperkalemia with potassium of 6.2 and intermittent third-degree heart block.  He ultimately underwent dual-chamber pacemaker implantation.    In 5/16, he developed malaise and chest discomfort after his rate response was turned on his device and a follow-up visit. Rate response was turned off. Lexiscan Myoview was arranged. This was intermediate risk with inferolateral apical infarct, no ischemia, EF 45%. In follow-up, he was not that symptomatic. Cardiac catheterization was deferred. He saw Dr. Caryl Comes in 8/16 for follow-up.  His chart indicates that when he saw Dr. Caryl Comes in 8/16, the patient requested that the rate response feature be turned back on. However, this was kept off due to prior complaints of chest discomfort. He called in recently with recurrent chest discomfort and was added on to my schedule today for further evaluation.  Over the past month, he has noted substernal chest discomfort that he describes as angina. This occurs with minimal activities. He can experience symptoms going up steps or with doing routine activities of daily living. He denies symptoms at rest. He does note radiation up into his neck. He does note associated dyspnea. He denies associated nausea or diaphoresis. He denies syncope. He denies orthopnea. He sleeps with CPAP. He denies LE  edema. He also has chronic chest discomfort that is related to positional changes and increased with coughing. This has been stable for over a year without significant change.   Studies/Reports Reviewed Today:  Myoview 02/20/15 Inferolateral and apical infarct, no ischemia, EF 45%, intermediate risk  Echo 01/08/15 EF 55-60%, normal wall motion, MAC, mild MR, moderate LAE Impressions:- Incomplete study. Study was terminated secondary to periods ofasystole.  Myoview 12/26/08 No ischemia  Past Medical History  Diagnosis Date  . Hypertension   . Sleep apnea     uses a cpap  . Diabetes mellitus   . GERD (gastroesophageal reflux disease)   . Arthritis   . Glaucoma   . Dialysis patient ([place])   . Blind right eye   . Chronic kidney disease     dialysis  . Kidney stones   . Coronary artery disease     hx of stent to RCA  . Hypoxia 01/11/2015  . CHB (complete heart block) ([place]) 01/08/15    PPM placed MDT  . Hx of cardiovascular stress test     Lexiscan Myoview 6/16:  Myocardial perfusion is abnormal. Findings consistent with prior myocardial infarction. This is an intermediate risk study. Overall left ventricular systolic function was abnormal. The left ventricular ejection fraction is mildly decreased (45-54%).     Past Surgical History  Procedure Laterality Date  . Dg av dialysis  shunt access exist*l* or  2010    left upper arm-not using  . Dg av dialysis  shunt access exist*l* or  2012    right upper  arm-using this one  . Shoulder arthroscopy      right  . Knee arthroscopy      left  . Tonsillectomy    . Eye surgery      multiple rt eye surgeries  . Coronary stent placement   1999     RCA  . Trigger finger release  06/08/2012    Procedure: RELEASE TRIGGER FINGER/A-1 PULLEY;  Surgeon: Cammie Sickle., MD;  Location: [facility];  Service: Orthopedics;  Laterality: Right;  . Dupuytren contracture release  06/08/2012    Procedure: DUPUYTREN CONTRACTURE RELEASE;   Surgeon: Cammie Sickle., MD;  Location: [facility];  Service: Orthopedics;  Laterality: Right;  right ring fascia excision with A1 Pulley right ring  . Cystoscopy with retrograde pyelogram, ureteroscopy and stent placement  07/21/2012    Procedure: [place], URETEROSCOPY AND STENT PLACEMENT;  Surgeon: Malka So, MD;  Location: [facility];  Service: Urology;  Laterality: Right;  . Mass excision  07/26/2012    Procedure: MINOR EXCISION OF MASS;  Surgeon: Izora Gala, MD;  Location: [facility];  Service: ENT;  Laterality: Right;  Excision of a Right Ear Skin Cancer with Primary Closure  wound class per Dr. Constance Holster   . Tee without cardioversion N/A 03/31/2013    Procedure: TRANSESOPHAGEAL ECHOCARDIOGRAM (TEE);  Surgeon: Sanda Klein, MD;  Location: [facility] ENDOSCOPY;  Service: Cardiovascular;  Laterality: N/A;  . Revision of arteriovenous goretex graft Right 10/19/2013    Procedure: REVISION OF ARTERIOVENOUS GORETEX GRAFT - thrombectomy;  Surgeon: Rosetta Posner, MD;  Location: [place];  Service: Vascular;  Laterality: Right;  . Cardiac catheterization N/A 01/08/2015    Procedure: Temporary Pacemaker;  Surgeon: Sanda Klein, MD;  Location: [place] [facility] INVASIVE CV LAB CUPID;  Service: Cardiovascular;  Laterality: N/A;  . Ep implantable device N/A 01/10/2015    Procedure: Pacemaker Implant;  Surgeon: Deboraha Sprang, MD;  Location: [facility] INVASIVE CV LAB CUPID;  Service: Cardiovascular;  Laterality: N/A;     Current Outpatient Prescriptions  Medication Sig Dispense Refill  . allopurinol (ZYLOPRIM) 300 MG tablet Take 150 mg by mouth daily.     Marland Kitchen amLODipine (NORVASC) 10 MG tablet Take 10 mg by mouth daily.    Marland Kitchen aspirin EC 81 MG tablet Take 81 mg by mouth daily.    . B Complex-C-Folic Acid (RENA-VITE RX) 1 MG TABS TK 1 T PO QD.  3  . b complex-vitamin c-folic acid (NEPHRO-VITE) 0.8 MG TABS Take 0.8 mg by mouth daily.    . cetirizine (ZYRTEC) 10 MG tablet Take 10 mg  by mouth daily.    . cinacalcet (SENSIPAR) 30 MG tablet Take 30 mg by mouth daily with supper.    . Ferric Citrate (AURYXIA) 210 MG TABS Take 2 tablets by mouth 3 (three) times daily.    . insulin NPH-insulin regular (NOVOLIN 70/30) (70-30) 100 UNIT/ML injection Inject 40 Units into the skin 2 (two) times daily.     Marland Kitchen losartan (COZAAR) 100 MG tablet Take 100 mg by mouth as directed. Take 1 tablet on Non-Dialysis days  3  . nitroGLYCERIN (NITROSTAT) 0.4 MG SL tablet Place 1 tablet (0.4 mg total) under the tongue every 5 (five) minutes x 3 doses as needed for chest pain. 25 tablet 12  . omeprazole (PRILOSEC) 20 MG capsule Take 40 mg by mouth daily.     . polyethylene glycol (MIRALAX / GLYCOLAX) packet Take 17 g by mouth  daily.     . sodium chloride (OCEAN) 0.65 % SOLN nasal spray Place 1 spray into the nose daily as needed for congestion.    . VELPHORO 500 MG chewable tablet Chew 500 mg by mouth 3 (three) times daily with meals.     . isosorbide mononitrate (IMDUR) 30 MG 24 hr tablet Take 0.5 tablets (15 mg total) by mouth daily. 30 tablet 11   No current facility-administered medications for this visit.    Allergies:   Review of patient's allergies indicates no known allergies.    Social History:  The patient  reports that he quit smoking about 20 years ago. His smoking use included Cigars. He does not have any smokeless tobacco history on file. He reports that he does not drink alcohol or use illicit drugs.   Family History:  The patient's family history includes Cancer in his brother; Heart disease in his father; Hypertension in his brother and mother; Kidney disease in his father.    ROS:   Please see the history of present illness.   Review of Systems  All other systems reviewed and are negative.     PHYSICAL EXAM: VS:  BP 140/48 mmHg  Pulse 60  Ht 5\' 11"  (1.803 m)  Wt 271 lb 12.8 oz (123.288 kg)  BMI 37.93 kg/m2  SpO2 94%    Wt Readings from Last 3 Encounters:  06/26/15 271  lb 12.8 oz (123.288 kg)  04/13/15 273 lb 12.8 oz (124.195 kg)  03/08/15 269 lb 9.6 oz (122.29 kg)     GEN: Well nourished, well developed, in no acute distress HEENT: normal Neck: no JVD,  no masses Cardiac:  Normal S1/S2, RRR; no murmur ,  no rubs or gallops, trace bilateral LE edema  Respiratory:  Faint crackles at the bases bilaterally, no wheezing, rhonchi  GI: soft, nontender, nondistended  MS: no deformity or atrophy Skin: warm and dry  Neuro:  CNs II-XII intact, Strength and sensation are intact Psych: Normal affect   EKG:  EKG is ordered today.  It demonstrates:   A paced, HR 60, RBBB, down-sloping ST segments aVF, V5-6, QTc 512 ms   Recent Labs: 01/08/2015: Magnesium 2.0; TSH 3.422 01/09/2015: ALT 13*; Hemoglobin 10.6*; Platelets 103* 01/11/2015: BUN 46*; Creatinine, Ser 8.58*; Potassium 4.6; Sodium 140    Lipid Panel No results found for: CHOL, TRIG, HDL, CHOLHDL, VLDL, LDLCALC, LDLDIRECT    ASSESSMENT AND PLAN:  1. Coronary artery disease involving native coronary artery of native heart with angina pectoris:  Patient presents with symptoms consistent with CCS class III angina. He has prior history of stenting to the RCA in 1999. Recent Myoview demonstrated inferolateral and apical scar, EF 45% no ischemia.  Area of scar was new compared to his prior study.  After his rate response was adjusted, the patient had no further chest symptoms and medical therapy was recommended for CAD. Now his symptoms seem to have escalated in the past month. He's taking nitroglycerin with relief of symptoms. His ECG does suggest some lateral ischemic changes. The patient was more concerned about having his rate response turned back on to see if this can alleviate his symptoms. In retrospect, he does not think he felt that bad when the rate response was on.  At this point, I have recommended proceeding with cardiac catheterization to further evaluate his symptoms. I reviewed his case with Dr. Marlou Porch,  who agreed.  Risks and benefits of cardiac catheterization have been discussed with the patient.  These  include bleeding, infection, kidney damage, stroke, heart attack, death.  The patient understands these risks and is willing to proceed. If his LHC does not demonstrate significant obstructive disease, we can further review the rate response issue with Dr. Virl Axe.    -  Arrange LHC with Dr. Quay Burow in the next 1-2 weeks (Tues or Thurs b/c of dialysis)  -  Start Imdur 15 mg QD. Hold on dialysis days.  -  Continue aspirin, amlodipine.  -  He is not on statin therapy for unclear reasons. Consider statin therapy (perform LHC first)  2. Complete heart block s/p Cardiac pacemaker in situ:  As noted, rate response turned has been turned off. If his heart catheterization does not demonstrate any significant, obstructive disease, we can certainly revisit the issue of whether or not to turn rate response back on with Dr. Caryl Comes.   3. ESRD (end stage renal disease):  He is on hemodialysis on Monday Wednesday Friday.  4. Essential hypertension:  Controlled.  5. Diabetes Mellitus:  Hold Novolin 70/30 a.m. of cardiac catheterization.    Medication Adjustments: Current medicines are reviewed at length with the patient today.  Concerns regarding medicines are as outlined above.  The following changes have been made:   Discontinued Medications   POLYETHYLENE GLYCOL POWDER (GLYCOLAX/MIRALAX) POWDER    DISSOLVE 17 GRAMS IN 8 OZ OF WATER.   New Prescriptions   ISOSORBIDE MONONITRATE (IMDUR) 30 MG 24 HR TABLET    Take 0.5 tablets (15 mg total) by mouth daily.   Modified Medications   No medications on file   Labs/ tests ordered today include:  Orders Placed This Encounter  Procedures  . Basic Metabolic Panel (BMET)  . CBC w/Diff  . INR/PT  . EKG 12-Lead     Disposition:    FU Dr. Quay Burow or me 2 weeks post cath.      Signed, Versie Starks, MHS 06/26/2015 4:56 PM      [facility] [address], [place], [place]  [address] Phone: [phone]; Fax: [phone]

## 2015-06-28 NOTE — Telephone Encounter (Signed)
Ptcb and has been notified of lab results and findings by phone with verbal understanding. Pt having cath today.

## 2015-06-28 NOTE — Interval H&P Note (Signed)
Cath Lab Visit (complete for each Cath Lab visit)  Clinical Evaluation Leading to the Procedure:   ACS: No.  Non-ACS:    Anginal Classification: CCS III  Anti-ischemic medical therapy: Maximal Therapy (2 or more classes of medications)  Non-Invasive Test Results: Intermediate-risk stress test findings: cardiac mortality 1-3%/year  Prior CABG: No previous CABG      History and Physical Interval Note:  07/05/2015 2:35 PM  Antonio Page  has presented today for surgery, with the diagnosis of excertional cp, cad  The various methods of treatment have been discussed with the patient and family. After consideration of risks, benefits and other options for treatment, the patient has consented to  Procedure(s): Left Heart Cath and Coronary Angiography (N/A) as a surgical intervention .  The patient's history has been reviewed, patient examined, no change in status, stable for surgery.  I have reviewed the patient's chart and labs.  Questions were answered to the patient's satisfaction.     Quay Burow

## 2015-06-29 ENCOUNTER — Observation Stay (HOSPITAL_COMMUNITY): Payer: Medicare Other

## 2015-06-29 ENCOUNTER — Encounter (HOSPITAL_COMMUNITY): Payer: Medicare Other

## 2015-06-29 ENCOUNTER — Encounter (HOSPITAL_COMMUNITY): Payer: Self-pay | Admitting: Cardiovascular Disease

## 2015-06-29 DIAGNOSIS — Z01810 Encounter for preprocedural cardiovascular examination: Secondary | ICD-10-CM | POA: Diagnosis not present

## 2015-06-29 DIAGNOSIS — R079 Chest pain, unspecified: Secondary | ICD-10-CM

## 2015-06-29 LAB — BASIC METABOLIC PANEL
ANION GAP: 13 (ref 5–15)
BUN: 34 mg/dL — ABNORMAL HIGH (ref 6–20)
CALCIUM: 10 mg/dL (ref 8.9–10.3)
CO2: 30 mmol/L (ref 22–32)
CREATININE: 8.13 mg/dL — AB (ref 0.61–1.24)
Chloride: 98 mmol/L — ABNORMAL LOW (ref 101–111)
GFR calc non Af Amer: 6 mL/min — ABNORMAL LOW (ref >60)
GFR, EST AFRICAN AMERICAN: 7 mL/min — AB (ref >60)
Glucose, Bld: 158 mg/dL — ABNORMAL HIGH (ref 65–99)
Potassium: 3.9 mmol/L (ref 3.5–5.1)
SODIUM: 141 mmol/L (ref 135–145)

## 2015-06-29 LAB — GLUCOSE, CAPILLARY
GLUCOSE-CAPILLARY: 156 mg/dL — AB (ref 65–99)
Glucose-Capillary: 215 mg/dL — ABNORMAL HIGH (ref 65–99)
Glucose-Capillary: 170 mg/dL — ABNORMAL HIGH (ref 65–99)
Glucose-Capillary: 169 mg/dL — ABNORMAL HIGH (ref 65–99)

## 2015-06-29 LAB — CBC
HCT: 27.8 % — ABNORMAL LOW (ref 39.0–52.0)
HEMOGLOBIN: 9 g/dL — AB (ref 13.0–17.0)
MCH: 31.9 pg (ref 26.0–34.0)
MCHC: 32.4 g/dL (ref 30.0–36.0)
MCV: 98.6 fL (ref 78.0–100.0)
Platelets: 87 10*3/uL — ABNORMAL LOW (ref 150–400)
RBC: 2.82 MIL/uL — AB (ref 4.22–5.81)
RDW: 15.2 % (ref 11.5–15.5)
WBC: 5.2 10*3/uL (ref 4.0–10.5)

## 2015-06-29 MED ORDER — DARBEPOETIN ALFA 60 MCG/0.3ML IJ SOSY
60.0000 ug | PREFILLED_SYRINGE | INTRAMUSCULAR | Status: DC
  Administered 2015-07-02: 60 ug via INTRAVENOUS
  Filled 2015-06-29: qty 0.3

## 2015-06-29 MED ORDER — ACETAMINOPHEN 325 MG PO TABS
ORAL_TABLET | ORAL | Status: AC
  Filled 2015-06-29: qty 2

## 2015-06-29 MED ORDER — DOXERCALCIFEROL 4 MCG/2ML IV SOLN
5.0000 ug | INTRAVENOUS | Status: DC
  Administered 2015-07-02 – 2015-07-04 (×2): 5 ug via INTRAVENOUS
  Filled 2015-06-29 (×3): qty 4

## 2015-06-29 MED ORDER — ~~LOC~~ CARDIAC SURGERY, PATIENT & FAMILY EDUCATION
Freq: Once | Status: AC
  Administered 2015-06-29: 06:00:00
  Filled 2015-06-29: qty 1

## 2015-06-29 MED ORDER — INSULIN ASPART 100 UNIT/ML ~~LOC~~ SOLN
0.0000 [IU] | Freq: Three times a day (TID) | SUBCUTANEOUS | Status: DC
  Administered 2015-06-29 – 2015-06-30 (×4): 3 [IU] via SUBCUTANEOUS
  Administered 2015-07-01 – 2015-07-02 (×6): 2 [IU] via SUBCUTANEOUS

## 2015-06-29 MED ORDER — CETYLPYRIDINIUM CHLORIDE 0.05 % MT LIQD
7.0000 mL | Freq: Two times a day (BID) | OROMUCOSAL | Status: DC
  Administered 2015-06-29 – 2015-07-05 (×10): 7 mL via OROMUCOSAL

## 2015-06-29 NOTE — Progress Notes (Signed)
Patient Name:  Antonio Page, DOB: Feb 14, 1941, MRN: [id number] Primary Doctor: Geoffery Lyons, MD Primary Cardiologist:   Gwenlyn Found  Date: 06/29/2015   SUBJECTIVE:  The patient cardiac catheterization yesterday. There is a high grade calcified distal left main lesion. Decision is made that bypass surgery is the best option. TCTS has been notified. Final consult opinion is pending. The patient will be having hemodialysis today here in the hospital.  Past Medical History  Diagnosis Date  . Hypertension   . Sleep apnea     uses a cpap  . Diabetes mellitus   . GERD (gastroesophageal reflux disease)   . Arthritis   . Glaucoma   . Dialysis patient ([place])   . Blind right eye   . Chronic kidney disease     dialysis  . Kidney stones   . Coronary artery disease     hx of stent to RCA  . Hypoxia 01/11/2015  . CHB (complete heart block) ([place]) 01/08/15    PPM placed MDT  . Hx of cardiovascular stress test     Lexiscan Myoview 6/16:  Myocardial perfusion is abnormal. Findings consistent with prior myocardial infarction. This is an intermediate risk study. Overall left ventricular systolic function was abnormal. The left ventricular ejection fraction is mildly decreased (45-54%).    Filed Vitals:   06/29/15 0530 06/29/15 0600 06/29/15 0630 06/29/15 0751  BP: 142/46 133/45 141/36 132/29  Pulse:    60  Temp:    98 F (36.7 C)  TempSrc:    Oral  Resp:    18  Height:      Weight:      SpO2:    92%    Intake/Output Summary (Last 24 hours) at 06/29/15 4010 Last data filed at 06/29/15 0801  Gross per 24 hour  Intake    360 ml  Output      0 ml  Net    360 ml   Filed Weights   06/19/2015 0923 06/29/15 0415  Weight: 275 lb (124.739 kg) 272 lb 0.8 oz (123.4 kg)     LABS: Basic Metabolic Panel:  Recent Labs  06/26/15 1106 06/29/15 0506  NA 144 141  K 4.2 3.9  CL 100 98*  CO2 32* 30  GLUCOSE 105* 158*  BUN 25 34*  CREATININE 6.10* 8.13*  CALCIUM 9.9 10.0   Liver  Function Tests: No results for input(s): AST, ALT, ALKPHOS, BILITOT, PROT, ALBUMIN in the last 72 hours. No results for input(s): LIPASE, AMYLASE in the last 72 hours. CBC:  Recent Labs  06/26/15 1106 06/29/15 0506  WBC 6.8 5.2  NEUTROABS 5.0  --   HGB 10.7* 9.0*  HCT 32.8* 27.8*  MCV 96.5 98.6  PLT 116* 87*   Cardiac Enzymes: No results for input(s): CKTOTAL, CKMB, CKMBINDEX, TROPONINI in the last 72 hours. BNP: Invalid input(s): POCBNP D-Dimer: No results for input(s): DDIMER in the last 72 hours. Thyroid Function Tests: No results for input(s): TSH, T4TOTAL, T3FREE, THYROIDAB in the last 72 hours.  Invalid input(s): FREET3  RADIOLOGY: No results found.  PHYSICAL EXAM  patient is oriented to person time and place. Affect is normal. His wife is in the room. Lungs are clear. Respiratory effort is nonlabored. Cardiac exam reveals an S1 and S2. The abdomen is soft. The right catheterization site is stable. There is mild ecchymoses. There is no hematoma.   ASSESSMENT AND PLAN:    End stage renal disease on dialysis ([place])  The patient is for dialysis today in the hospital.    CAD (coronary artery disease)      Catheterization yesterday showed severe disease. The patient will need bypass surgery. The plan for today will be to proceed with dialysis in the hospital. We will also await final assessment by thoracic surgery. Decisions will then be made about the timing of his bypass surgery. Our team will follow-up as the day goes on to see if the patient will be staying in the hospital for his surgery.    Dola Argyle 06/29/2015 8:22 AM

## 2015-06-29 NOTE — Progress Notes (Signed)
CARDIAC REHAB PHASE I   Pre-op education completed with pt and wife at bedside. Reviewed IS, sternal precautions, activity progression, cardiac surgery book and OHS guidelines. Pt and wife verbalized understanding. Pt and wife have already viewed OHS videos. Will hold ambulation due to significant left main disease until TCTS has seen pt, please advise if pre-op ambulation indicated. Pt in recliner, call bell within reach. Will follow.   [id number]  Lenna Sciara, RN, BSN 06/29/2015 9:53 AM

## 2015-06-29 NOTE — Consult Note (Signed)
Antonio Page Renal Consultation Note    Indication for Consultation:  Management of ESRD/hemodialysis; anemia, hypertension/volume and secondary hyperparathyroidism PCP: Geoffery Lyons, MD  HPI: Antonio Page is a 74 y.o. male with ESRD on hemodialysis MWF at [facility]. Past medical history significant for hypertension, DM, nephrolithiasis, GERD,OSA (uses CPAP), CAD with hx of stent to RCA, CHB with permanent pacemaker per Dr. Caryl Comes (5/16) Abnormal lexiscan 06/16 intermediate risk with inferolateral apical infarct, no ischemia, EF 45%.). 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   Patient was seen in cardiology office 06/25/2015 for C/O angina (CCS class III angina). Patient was started on Imdur and sent for cardiac catherization 07/02/2015 per Dr. Gwenlyn Found. Patient was found to have 80% left main stenosis, 95% ostial circumflex, normal EF. Patient has been referred to Dr. Servando Snare for evaluation of CABG. Patient currently denies chest pain/SOB, nausea, vomiting, diaphoresis, DOE, orthopnea, no LE edema, fever, chills, diarrhea, constipation, abdominal pain, headaches, syncope. Currently on Nitroglycerin drip for chest.  Patient recently transferred from [facility] to [facility]. Patient is generally compliant with HD therapy. Last in center lab values: Ca 9.6 CCa 9.6 Phos 6.3 PTH 456 (06/06/2015) Hgb 10.2 Mircera 50 mcg IV q 2 weeks (last dose 06/20/2015).   Past Medical History  Diagnosis Date  . Hypertension   . Sleep apnea     uses a cpap  . Diabetes mellitus   . GERD (gastroesophageal reflux disease)   . Arthritis   . Glaucoma   . Dialysis patient ([place])   . Blind right eye   . Chronic kidney disease     dialysis  . Kidney stones   . Coronary artery disease     hx of stent to RCA  . Hypoxia 01/11/2015  . CHB (complete heart block) ([place]) 01/08/15    PPM placed MDT  . Hx of cardiovascular stress test     Lexiscan Myoview 6/16:   Myocardial perfusion is abnormal. Findings consistent with prior myocardial infarction. This is an intermediate risk study. Overall left ventricular systolic function was abnormal. The left ventricular ejection fraction is mildly decreased (45-54%).    Past Surgical History  Procedure Laterality Date  . Dg av dialysis  shunt access exist*l* or Left 2010    upper arm-not using  . Dg av dialysis  shunt access exist*l* or Right 2012    upper arm-using this one  . Shoulder arthroscopy Right   . Knee arthroscopy Left   . Tonsillectomy    . Eye surgery Right multiple    multiple  . Coronary angioplasty with stent placement   1999     RCA  . Trigger finger release  06/08/2012    Procedure: RELEASE TRIGGER FINGER/A-1 PULLEY;  Surgeon: Cammie Sickle., MD;  Location: [place];  Service: Orthopedics;  Laterality: Right;  . Dupuytren contracture release  06/08/2012    Procedure: DUPUYTREN CONTRACTURE RELEASE;  Surgeon: Cammie Sickle., MD;  Location: [place];  Service: Orthopedics;  Laterality: Right;  right ring fascia excision with A1 Pulley right ring  . Cystoscopy with retrograde pyelogram, ureteroscopy and stent placement  07/21/2012    Procedure: [place], URETEROSCOPY AND STENT PLACEMENT;  Surgeon: Malka So, MD;  Location: [facility];  Service: Urology;  Laterality: Right;  . Mass excision  07/26/2012    Procedure: MINOR EXCISION OF MASS;  Surgeon: Izora Gala, MD;  Location: [place];  Service: ENT;  Laterality: Right;  Excision of a Right Ear Skin Cancer with Primary Closure  wound class per Dr. Constance Holster   . Tee without cardioversion N/A 03/31/2013    Procedure: TRANSESOPHAGEAL ECHOCARDIOGRAM (TEE);  Surgeon: Sanda Klein, MD;  Location: [facility] ENDOSCOPY;  Service: Cardiovascular;  Laterality: N/A;  . Revision of arteriovenous goretex graft Right 10/19/2013    Procedure: REVISION OF ARTERIOVENOUS GORETEX GRAFT -  thrombectomy;  Surgeon: Rosetta Posner, MD;  Location: [place];  Service: Vascular;  Laterality: Right;  . Cardiac catheterization N/A 01/08/2015    Procedure: Temporary Pacemaker;  Surgeon: Sanda Klein, MD;  Location: [place] INVASIVE CV LAB CUPID;  Service: Cardiovascular;  Laterality: N/A;  . Ep implantable device N/A 01/10/2015    Procedure: Pacemaker Implant;  Surgeon: Deboraha Sprang, MD;  Location: [facility] INVASIVE CV LAB CUPID;  Service: Cardiovascular;  Laterality: N/A;  . Cardiac catheterization N/A 06/22/2015    Procedure: Left Heart Cath and Coronary Angiography;  Surgeon: Lorretta Harp, MD;  Location: [place] CV LAB;  Service: Cardiovascular;  Laterality: N/A;   Family History  Problem Relation Age of Onset  . Heart disease Father   . Kidney disease Father   . Hypertension Mother   . Cancer Brother   . Hypertension Brother    Social History:  reports that he quit smoking about 20 years ago. His smoking use included Cigars. He does not have any smokeless tobacco history on file. He reports that he does not drink alcohol or use illicit drugs. No Known Allergies Prior to Admission medications   Medication Sig Start Date End Date Taking? Authorizing Provider  allopurinol (ZYLOPRIM) 300 MG tablet Take 150 mg by mouth daily.    Yes Historical Provider, MD  amLODipine (NORVASC) 10 MG tablet Take 10 mg by mouth daily.   Yes Historical Provider, MD  aspirin EC 81 MG tablet Take 81 mg by mouth daily.   Yes Historical Provider, MD  b complex-vitamin c-folic acid (NEPHRO-VITE) 0.8 MG TABS Take 0.8 mg by mouth daily.   Yes Historical Provider, MD  Butenafine HCl (LOTRIMIN ULTRA EX) Apply 1 application topically as needed (itching).   Yes Historical Provider, MD  cetirizine (ZYRTEC) 10 MG tablet Take 10 mg by mouth daily.   Yes Historical Provider, MD  cinacalcet (SENSIPAR) 30 MG tablet Take 30 mg by mouth daily with supper.   Yes Historical Provider, MD  Ferric Citrate (AURYXIA) 210 MG TABS Take 2  tablets by mouth 3 (three) times daily.   Yes Historical Provider, MD  insulin NPH-insulin regular (NOVOLIN 70/30) (70-30) 100 UNIT/ML injection Inject 40 Units into the skin 2 (two) times daily.    Yes Historical Provider, MD  isosorbide mononitrate (IMDUR) 30 MG 24 hr tablet Take 0.5 tablets (15 mg total) by mouth daily. 06/26/15  Yes Liliane Shi, PA-C  losartan (COZAAR) 100 MG tablet Take 100 mg by mouth as directed. Take 1 tablet on Non-Dialysis days 12/14/14  Yes Historical Provider, MD  nitroGLYCERIN (NITROSTAT) 0.4 MG SL tablet Place 1 tablet (0.4 mg total) under the tongue every 5 (five) minutes x 3 doses as needed for chest pain. 01/12/15  Yes Brett Canales, PA-C  omeprazole (PRILOSEC) 20 MG capsule Take 40 mg by mouth daily.    Yes Historical Provider, MD  polyethylene glycol (MIRALAX / GLYCOLAX) packet Take 17 g by mouth daily as needed for moderate constipation.    Yes Historical Provider, MD  sodium chloride (OCEAN) 0.65 % SOLN nasal spray Place  1 spray into the nose daily as needed for congestion.   Yes Historical Provider, MD   Current Facility-Administered Medications  Medication Dose Route Frequency Provider Last Rate Last Dose  . 0.9 %  sodium chloride infusion  250 mL Intravenous PRN Lorretta Harp, MD      . acetaminophen (TYLENOL) tablet 650 mg  650 mg Oral Q4H PRN Lorretta Harp, MD      . allopurinol (ZYLOPRIM) tablet 150 mg  150 mg Oral Daily Lorretta Harp, MD   150 mg at 06/29/15 1042  . amLODipine (NORVASC) tablet 10 mg  10 mg Oral Daily Lorretta Harp, MD   10 mg at 06/29/15 1042  . antiseptic oral rinse (CPC / CETYLPYRIDINIUM CHLORIDE 0.05%) solution 7 mL  7 mL Mouth Rinse BID Lorretta Harp, MD      . aspirin EC tablet 81 mg  81 mg Oral Daily Lorretta Harp, MD   81 mg at 06/29/15 1041  . atorvastatin (LIPITOR) tablet 80 mg  80 mg Oral q1800 Lorretta Harp, MD   80 mg at 07/01/2015 1819  . cinacalcet (SENSIPAR) tablet 30 mg  30 mg Oral Q supper Lorretta Harp, MD   30 mg at 06/11/2015 2127  . ferrous sulfate tablet 325 mg  325 mg Oral TID WC Myrene Galas, RPH   325 mg at 06/29/15 1040  . hydrALAZINE (APRESOLINE) injection 10 mg  10 mg Intravenous Q4H PRN Lorretta Harp, MD      . insulin aspart (novoLOG) injection 0-15 Units  0-15 Units Subcutaneous TID WC Almyra Deforest, PA      . isosorbide mononitrate (IMDUR) 24 hr tablet 15 mg  15 mg Oral Daily Lorretta Harp, MD   15 mg at 06/29/15 1041  . loratadine (CLARITIN) tablet 10 mg  10 mg Oral Daily Lorretta Harp, MD   10 mg at 06/29/15 1041  . [START ON 06/30/2015] losartan (COZAAR) tablet 100 mg  100 mg Oral Once per day on Sun Tue Thu Sat Lorretta Harp, MD      . morphine 2 MG/ML injection 2 mg  2 mg Intravenous Q1H PRN Lorretta Harp, MD   2 mg at 06/29/15 1244  . multivitamin (RENA-VIT) tablet 1 tablet  1 tablet Oral QHS Lorretta Harp, MD   1 tablet at 07/01/2015 2200  . nitroGLYCERIN (NITROSTAT) SL tablet 0.4 mg  0.4 mg Sublingual Q5 Min x 3 PRN Lorretta Harp, MD      . nitroGLYCERIN 50 mg in dextrose 5 % 250 mL (0.2 mg/mL) infusion  5-200 mcg/min Intravenous Titrated Lorretta Harp, MD 9 mL/hr at 06/29/15 0600 30 mcg/min at 06/29/15 0600  . ondansetron (ZOFRAN) injection 4 mg  4 mg Intravenous Q6H PRN Lorretta Harp, MD      . pantoprazole (PROTONIX) EC tablet 80 mg  80 mg Oral Daily Lorretta Harp, MD   80 mg at 06/29/15 1040  . polyethylene glycol (MIRALAX / GLYCOLAX) packet 17 g  17 g Oral Daily PRN Lorretta Harp, MD      . sodium chloride (OCEAN) 0.65 % nasal spray 1 spray  1 spray Nasal Daily PRN Lorretta Harp, MD      . sodium chloride 0.9 % injection 3 mL  3 mL Intravenous PRN Lorretta Harp, MD       Labs: Basic Metabolic Panel:  Recent Labs Lab 06/26/15 1106 06/29/15 0506  NA  144 141  K 4.2 3.9  CL 100 98*  CO2 32* 30  GLUCOSE 105* 158*  BUN 25 34*  CREATININE 6.10* 8.13*  CALCIUM 9.9 10.0   Liver Function Tests: No results for input(s): AST,  ALT, ALKPHOS, BILITOT, PROT, ALBUMIN in the last 168 hours. No results for input(s): LIPASE, AMYLASE in the last 168 hours. No results for input(s): AMMONIA in the last 168 hours. CBC:  Recent Labs Lab 06/26/15 1106 06/29/15 0506  WBC 6.8 5.2  NEUTROABS 5.0  --   HGB 10.7* 9.0*  HCT 32.8* 27.8*  MCV 96.5 98.6  PLT 116* 87*   Cardiac Enzymes: No results for input(s): CKTOTAL, CKMB, CKMBINDEX, TROPONINI in the last 168 hours. CBG:  Recent Labs Lab 06/29/2015 1039 06/26/2015 1521 06/26/2015 2214 06/29/15 0614 06/29/15 1059  GLUCAP 145* 116* 178* 156* 215*   Iron Studies: No results for input(s): IRON, TIBC, TRANSFERRIN, FERRITIN in the last 72 hours. Studies/Results: No results found.  ROS: As per HPI otherwise negative.  Review of Systems:  Physical Exam: Filed Vitals:   06/29/15 0600 06/29/15 0630 06/29/15 0751 06/29/15 1159  BP: 133/45 141/36 132/29 143/50  Pulse:   60 60  Temp:   98 F (36.7 C) 97.8 F (36.6 C)  TempSrc:   Oral Oral  Resp:   18 18  Height:      Weight:      SpO2:   92% 93%     General: Well developed, well nourished, in no acute distress. Head: Normocephalic, atraumatic, sclera non-icteric, mucus membranes are moist Neck: Supple. JVD not elevated. Lungs: Clear bilaterally to auscultation without wheezes, rales, or rhonchi. Breathing is unlabored. Heart: RRR with S1 S2. No murmurs, rubs, or gallops appreciated. Abdomen: Soft, non-tender, non-distended with normoactive bowel sounds. No rebound/guarding. No obvious abdominal masses. M-S:  Strength and tone appear normal for age. Lower extremities:without edema or ischemic changes, no open wounds  Neuro: Alert and oriented X 3. Moves all extremities spontaneously. Psych:  Responds to questions appropriately with a normal affect. Dialysis Access: LUA AVF + Thrill + Bruit  Dialysis Orders: Center: [facility]  on MWF . EDW 121.5 kg HD Bath 2.0 K 2.0 Ca  Time 4 hours 15 minutes Heparin 4000 Units per tx.  Access LUA AVF BFR 500 DFR 800 manual    Mircera 50 mcg IV q 2 weeks (last dose 06/20/2015) Hectoral 5 mcg IV q MWF   Assessment/Plan: 1.  CAD: 80% left main stenosis, 95% ostial circumflex. Referred to [facility] for CABG (Dr. Servando Snare) 2.  ESRD -  MWF. For HD today. K+3.9 Change to 3 K bath 3.  Hypertension/volume  - On amlodipine 10 mg PO Q hs, Imdur 15 mg po daily, Losartan 100 mg PO Sun, Tues, Thurs, Sat. EDW 121.5 current wt 123.4. Will attempt UFG 2.5-3 liters. 4.  Anemia  - HGB 9.0. Follow CBC. Will start ESA.  5.  Metabolic bone disease -  Continue sensipar, Hectoral 6.  Nutrition - Carb mod/renal diet. Albumin 4.0 (06/06/15).  7.  DM: Per primary.   Rita H. Owens Shark, NP-C 06/29/2015, 2:00 PM  [employer] [phone]   Pt seen, examined and agree w A/P as above.  ESRD patient with DM and new diagnosis of left main CAD w 80% stenosis.  Has a PPM, OSA on CPAP, HTN on norvasc/ ARB/ Imdur. Plan HD today with small UF as tolerated.  Kelly Splinter MD [employer] pager [phone]    cell [phone]  06/29/2015, 4:59 PM

## 2015-06-29 NOTE — Progress Notes (Signed)
Pre-op Cardiac Surgery  Carotid Findings:   Right ICA 40-59% stenosis, based on systolic velocity and ratio, 8-83% based on diastolic velocity.  Left ICA 1-39% stenosis.   Antegrade vertebral flow.    Upper Extremity Right Left  Brachial Pressures N/A HD access 151  Radial Waveforms Tri Tri  Ulnar Waveforms Tri Tri  Palmar Arch (Allen's Test) Obliterates with radial compression, normal with ulnar compression Normal    Findings:   Palpable pedal pulses.    Landry Mellow, RDMS, RVT 06/29/2015

## 2015-06-29 NOTE — Procedures (Signed)
  I was present at this dialysis session, have reviewed the session itself and made  appropriate changes Kelly Splinter MD [place] pager [phone]    cell [phone] 06/29/2015, 5:03 PM

## 2015-06-29 NOTE — Progress Notes (Signed)
Patient didn't tolerate much fluid removal , about 1.5 liters today at dialysis and then BP dropped into the 90's then the 70's and he felt quite bad, though no CP. Rinsed back and given NS and IV NTG turned off and he is feeling better.  Back to room.   Pt seen, examined and agree w A/P as above.  Kelly Splinter MD [employer] pager [phone]    cell [phone] 06/29/2015, 5:19 PM

## 2015-06-30 ENCOUNTER — Ambulatory Visit (HOSPITAL_COMMUNITY): Payer: Medicare Other

## 2015-06-30 ENCOUNTER — Encounter (HOSPITAL_COMMUNITY): Payer: Self-pay | Admitting: Nurse Practitioner

## 2015-06-30 DIAGNOSIS — Z992 Dependence on renal dialysis: Secondary | ICD-10-CM

## 2015-06-30 DIAGNOSIS — I2510 Atherosclerotic heart disease of native coronary artery without angina pectoris: Secondary | ICD-10-CM | POA: Diagnosis not present

## 2015-06-30 DIAGNOSIS — I200 Unstable angina: Secondary | ICD-10-CM

## 2015-06-30 DIAGNOSIS — D631 Anemia in chronic kidney disease: Secondary | ICD-10-CM

## 2015-06-30 DIAGNOSIS — N185 Chronic kidney disease, stage 5: Secondary | ICD-10-CM

## 2015-06-30 DIAGNOSIS — I25110 Atherosclerotic heart disease of native coronary artery with unstable angina pectoris: Secondary | ICD-10-CM | POA: Diagnosis not present

## 2015-06-30 DIAGNOSIS — N186 End stage renal disease: Secondary | ICD-10-CM | POA: Diagnosis not present

## 2015-06-30 LAB — GLUCOSE, CAPILLARY
GLUCOSE-CAPILLARY: 155 mg/dL — AB (ref 65–99)
GLUCOSE-CAPILLARY: 165 mg/dL — AB (ref 65–99)
Glucose-Capillary: 163 mg/dL — ABNORMAL HIGH (ref 65–99)
Glucose-Capillary: 131 mg/dL — ABNORMAL HIGH (ref 65–99)

## 2015-06-30 MED ORDER — POLYETHYLENE GLYCOL 3350 17 G PO PACK
17.0000 g | PACK | Freq: Every day | ORAL | Status: DC | PRN
  Administered 2015-07-02: 17 g via ORAL
  Filled 2015-06-30: qty 1

## 2015-06-30 MED ORDER — FERRIC CITRATE 1 GM 210 MG(FE) PO TABS: 2.0000 | ORAL_TABLET | Freq: Three times a day (TID) | ORAL | Status: DC

## 2015-06-30 MED ORDER — SUCROFERRIC OXYHYDROXIDE 500 MG PO CHEW
1000.0000 mg | CHEWABLE_TABLET | Freq: Three times a day (TID) | ORAL | Status: DC
  Administered 2015-07-01 – 2015-07-07 (×9): 1000 mg via ORAL
  Filled 2015-06-30 (×27): qty 2

## 2015-06-30 NOTE — Progress Notes (Signed)
VASCULAR LAB PRELIMINARY  PRELIMINARY  PRELIMINARY  PRELIMINARY  Bilateral lower extremity vein mapping completed.  Multiple varicosities noted throughout with branches noted. There is no pseudoaneurysm noted in the right groin.    Right Lower Extremity Vein Map    Right Great Saphenous Vein   Segment Diameter Comment  1. Origin 6.89mm   2. High Thigh 4.70mm   3. Mid Thigh 4.3mm branch  4. Low Thigh 3.63mm   5. At Knee 3.73mm varicosities  6. High Calf 3.39mm   7. Low Calf 1.9mm   8. Ankle 1.54mm varicosities   mm    mm    mm       Left Lower Extremity Vein Map    Left Great Saphenous Vein   Segment Diameter Comment  1. Origin 4.50mm   2. High Thigh 35mm branch  3. Mid Thigh 76mm   4. Low Thigh 44mm   5. At Knee 66mm varicosities  6. High Calf 38mm   7. Low Calf 3.26mm Multiple branches  8. Ankle 2.6mm    mm    mm    mm

## 2015-06-30 NOTE — Progress Notes (Signed)
[facility] Progress Note   Subjective:   "I'm feeling good" Up in chair, no C/O chest pain. Had hypotensive episode in HD yesterday, BP stable now. Cont. On low dose IV NTG.   Objective Filed Vitals:   06/30/15 0400 06/30/15 0500 06/30/15 0600 06/30/15 0755  BP: 149/44 129/58 132/47 145/41  Pulse:    75  Temp:    98.1 F (36.7 C)  TempSrc:    Oral  Resp:    18  Height:      Weight:      SpO2:    93%   Physical Exam General: well nourished, awake, alert very pleasant NAD Heart: S1,S2, RRR  Lungs: Bilateral breath sounds CTA A/P Abdomen: Active BS nontender, nondistended Extremities: No LE edema. Pulses intact Dialysis Access: LUA AVF + Thrill + Bruit  Dialysis Orders: Center: [facility] on MWF . EDW 121.5 kg HD Bath 2.0 K 2.0 Ca Time 4 hours 15 minutes Heparin 4000 Units per tx. Access LUA AVF BFR 500 DFR 800 manual  Mircera 50 mcg IV q 2 weeks (last dose 06/20/2015) Hectoral 5 mcg IV q MWF  Additional Objective Labs: Basic Metabolic Panel:  Recent Labs Lab 06/26/15 1106 06/29/15 0506  NA 144 141  K 4.2 3.9  CL 100 98*  CO2 32* 30  GLUCOSE 105* 158*  BUN 25 34*  CREATININE 6.10* 8.13*  CALCIUM 9.9 10.0   Liver Function Tests: No results for input(s): AST, ALT, ALKPHOS, BILITOT, PROT, ALBUMIN in the last 168 hours. No results for input(s): LIPASE, AMYLASE in the last 168 hours. CBC:  Recent Labs Lab 06/26/15 1106 06/29/15 0506  WBC 6.8 5.2  NEUTROABS 5.0  --   HGB 10.7* 9.0*  HCT 32.8* 27.8*  MCV 96.5 98.6  PLT 116* 87*   Blood Culture    Component Value Date/Time   SDES BLOOD RIGHT HAND 06/16/2009 1913   SPECREQUEST BOTTLES DRAWN AEROBIC AND ANAEROBIC 5CC EA 06/16/2009 1913   CULT NO GROWTH 5 DAYS 06/16/2009 1913   REPTSTATUS 06/23/2009 FINAL 06/16/2009 1913    Cardiac Enzymes: No results for input(s): CKTOTAL, CKMB, CKMBINDEX, TROPONINI in the last 168 hours. CBG:  Recent Labs Lab 06/29/15 0614 06/29/15 1059  06/29/15 1800 06/29/15 2049 06/30/15 0619  GLUCAP 156* 215* 170* 169* 155*   Iron Studies: No results for input(s): IRON, TIBC, TRANSFERRIN, FERRITIN in the last 72 hours. @lablastinr3 @ Studies/Results: No results found. Medications: . nitroGLYCERIN 30 mcg/min (06/29/15 0600)   . allopurinol  150 mg Oral Daily  . amLODipine  10 mg Oral Daily  . antiseptic oral rinse  7 mL Mouth Rinse BID  . aspirin EC  81 mg Oral Daily  . atorvastatin  80 mg Oral q1800  . cinacalcet  30 mg Oral Q supper  . [START ON 07/02/2015] darbepoetin (ARANESP) injection - DIALYSIS  60 mcg Intravenous Q Mon-HD  . [START ON 07/02/2015] doxercalciferol  5 mcg Intravenous Q M,W,F-HD  . ferrous sulfate  325 mg Oral TID WC  . insulin aspart  0-15 Units Subcutaneous TID WC  . loratadine  10 mg Oral Daily  . losartan  100 mg Oral Once per day on Sun Tue Thu Sat  . multivitamin  1 tablet Oral QHS  . pantoprazole  80 mg Oral Daily    Assessment/Plan: 1. CAD: 80% left main stenosis, 95% ostial circumflex. Referred to [facility] for CABG (Dr. Servando Snare). Plan for CABG Tuesday. 2. ESRD - MWF. Plan for HD Monday. 3. Hypertension/volume - Had  HD yesterday, however dropped BP, treatment shortened. Net UF 1643. NO POST WT/DAILY WT recorded! Will have HD Monday before surgery Tuesday. Order daily wts! 4. Anemia - HGB 9.0. Follow CBC. Will start ESA.  5. Metabolic bone disease - Continue sensipar, Hectoral 6. Nutrition - Carb mod/renal diet. Albumin 4.0 (06/06/15).  7. DM: Per primary.  Rita H. Owens Shark, NP-C 06/29/2015, 2:00 PM  [employer] [phone]  Pt seen, examined and agree w A/P as above.  Kelly Splinter MD [employer] pager [phone]    cell [phone] 06/30/2015, 11:54 AM

## 2015-06-30 NOTE — Progress Notes (Signed)
Patient Name:  Antonio Page, DOB: April 16, 1941, MRN: [id number] Primary Doctor: Geoffery Lyons, MD Primary Cardiologist:   Gwenlyn Found  Date: 06/30/2015   SUBJECTIVE:  The patient  Is s/p cardiac catheterization. There is a high grade calcified distal left main lesion. Decision is made that bypass surgery is the best option. Dr. Servando Snare has seen with plans for CABG on Tuesday. Dialysis limited yesterday due to hypotension. Denies any chest pain this am. No SOB. Prior to hospitalization was having angina with any activity.  Past Medical History  Diagnosis Date  . Hypertension   . GERD (gastroesophageal reflux disease)   . Arthritis   . Glaucoma   . Blind right eye   . Coronary artery disease     hx of stent to RCA  . Hypoxia 01/11/2015  . CHB (complete heart block) ([place]) 01/08/15    PPM placed MDT  . Hx of cardiovascular stress test     Lexiscan Myoview 6/16:  Myocardial perfusion is abnormal. Findings consistent with prior myocardial infarction. This is an intermediate risk study. Overall left ventricular systolic function was abnormal. The left ventricular ejection fraction is mildly decreased (45-54%).   Marland Kitchen ESRD needing dialysis [facility])     MWF at [facility] (06/29/2015)  . Kidney stones   . Type II diabetes mellitus ([place])   . OSA on CPAP   . Presence of permanent cardiac pacemaker    Filed Vitals:   06/30/15 0340 06/30/15 0400 06/30/15 0500 06/30/15 0600  BP: 139/45 149/44 129/58 132/47  Pulse: 60     Temp: 98 F (36.7 C)     TempSrc: Oral     Resp: 20     Height:      Weight:      SpO2: 90%       Intake/Output Summary (Last 24 hours) at 06/30/15 0751 Last data filed at 06/29/15 1836  Gross per 24 hour  Intake    340 ml  Output    743 ml  Net   -403 ml   Filed Weights   06/29/15 0415 06/29/15 1454 06/29/15 1500  Weight: 123.4 kg (272 lb 0.8 oz) 123.7 kg (272 lb 11.3 oz) 123.7 kg (272 lb 11.3 oz)     LABS: Basic Metabolic  Panel:  Recent Labs  06/29/15 0506  NA 141  K 3.9  CL 98*  CO2 30  GLUCOSE 158*  BUN 34*  CREATININE 8.13*  CALCIUM 10.0   Liver Function Tests: No results for input(s): AST, ALT, ALKPHOS, BILITOT, PROT, ALBUMIN in the last 72 hours. No results for input(s): LIPASE, AMYLASE in the last 72 hours. CBC:  Recent Labs  06/29/15 0506  WBC 5.2  HGB 9.0*  HCT 27.8*  MCV 98.6  PLT 87*   Cardiac Enzymes: No results for input(s): CKTOTAL, CKMB, CKMBINDEX, TROPONINI in the last 72 hours. BNP: Invalid input(s): POCBNP D-Dimer: No results for input(s): DDIMER in the last 72 hours. Thyroid Function Tests: No results for input(s): TSH, T4TOTAL, T3FREE, THYROIDAB in the last 72 hours.  Invalid input(s): FREET3  RADIOLOGY: No results found.  PHYSICAL EXAM   GENERAL:  Obese WM in NAD HEENT:  PERRL, EOMI, sclera are clear. Oropharynx is clear. NECK:  No jugular venous distention, carotid upstroke brisk and symmetric, no bruits, no thyromegaly or adenopathy LUNGS:  Clear to auscultation bilaterally CHEST:  Unremarkable HEART:  RRR,  PMI not displaced or sustained,S1 and S2 within normal limits, no S3, no S4:  no clicks, no rubs, no murmurs ABD:  Soft, obese, nontender. BS +, no masses or bruits.  EXT:  2 + pulses throughout, tr edema, no cyanosis no clubbing SKIN:  Warm and dry.  No rashes NEURO:  Alert and oriented x 3. Cranial nerves II through XII intact. PSYCH:  Cognitively intact     ASSESSMENT AND PLAN: 1. Unstable angina with significant left main CAD. Angina improved on IV Ntg. Will stop Imdur while on IV Ntg. Continue beta blocker and amlodipine. For CABG on Tuesday. 2.   End stage renal disease on dialysis [facility])     Anticipate dialysis on Monday. 3. HTN. On Cozaar on non dialysis days only.  4. Chronic anemia of ESRD 5. Thrombocytopenia. 6. DM type 2. 7. History of CHB s/p pacemaker. Stable atrial pacing on monitor.  8. Hyperlipidemia on high dose statin.     Collier Salina [facility]  06/30/2015 7:51 AM

## 2015-06-30 NOTE — Progress Notes (Signed)
Patient ID: Antonio Page, male   DOB: 01-17-1941, 74 y.o.   MRN: [id number]      [name]       [place] [address]             [phone]                 2 Days Post-Op Procedure(s) (LRB): Left Heart Cath and Coronary Angiography (N/A)    Subjective: No chest pain  Objective: Vital signs in last 24 hours: Patient Vitals for the past 24 hrs:  BP Temp Temp src Pulse Resp SpO2  06/30/15 1111 (!) 157/51 mmHg 98.2 F (36.8 C) Oral 60 - 99 %  06/30/15 0800 - - - - - 95 %  06/30/15 0755 (!) 145/41 mmHg 98.1 F (36.7 C) Oral 75 18 93 %  06/30/15 0600 (!) 132/47 mmHg - - - - -  06/30/15 0500 (!) 129/58 mmHg - - - - -  06/30/15 0400 (!) 149/44 mmHg - - - - -  06/30/15 0340 (!) 139/45 mmHg 98 F (36.7 C) Oral 60 20 90 %  06/30/15 0300 (!) 147/26 mmHg - - - - -  06/30/15 0100 (!) 122/26 mmHg - - - - -  06/30/15 0030 (!) 158/38 mmHg - - - - -  06/29/15 2003 (!) 130/45 mmHg 98 F (36.7 C) Oral 62 16 91 %  06/29/15 1802 (!) 125/48 mmHg 98.1 F (36.7 C) Oral (!) 59 14 98 %  06/29/15 1705 (!) 114/39 mmHg - Oral 66 - -  06/29/15 1650 (!) 101/41 mmHg 98 F (36.7 C) Oral 62 (!) 21 -  06/29/15 1645 (!) 78/37 mmHg - - 60 20 -  06/29/15 1630 (!) 114/57 mmHg - - 62 20 -  06/29/15 1600 (!) 111/56 mmHg - - 61 18 -    Filed Weights   06/29/15 0415 06/29/15 1454 06/29/15 1500  Weight: 272 lb 0.8 oz (123.4 kg) 272 lb 11.3 oz (123.7 kg) 272 lb 11.3 oz (123.7 kg)    Hemodynamic parameters for last 24 hours:    Intake/Output from previous day: 10/21 0701 - 10/22 0700 In: 605.4 [P.O.:340; I.V.:265.4] Out: 743  Intake/Output this shift: Total I/O In: 148.8 [P.O.:100; I.V.:48.8] Out: -   Scheduled Meds: . allopurinol  150 mg Oral Daily  . amLODipine  10 mg Oral Daily  . antiseptic oral rinse  7 mL Mouth Rinse BID  . aspirin EC  81 mg Oral Daily  . atorvastatin  80 mg Oral q1800  . cinacalcet  30 mg Oral Q supper  . [START ON 07/02/2015] darbepoetin (ARANESP)  injection - DIALYSIS  60 mcg Intravenous Q Mon-HD  . [START ON 07/02/2015] doxercalciferol  5 mcg Intravenous Q M,W,F-HD  . ferrous sulfate  325 mg Oral TID WC  . insulin aspart  0-15 Units Subcutaneous TID WC  . loratadine  10 mg Oral Daily  . losartan  100 mg Oral Once per day on Sun Tue Thu Sat  . multivitamin  1 tablet Oral QHS  . pantoprazole  80 mg Oral Daily   Continuous Infusions: . nitroGLYCERIN 10 mcg/min (06/30/15 0700)   PRN Meds:.sodium chloride, acetaminophen, hydrALAZINE, morphine injection, nitroGLYCERIN, ondansetron (ZOFRAN) IV, polyethylene glycol, sodium chloride, sodium chloride  General appearance: alert, cooperative, appears stated age and no distress Neurologic: intact Heart: regular rate and rhythm, S1, S2 normal, no murmur, click, rub or gallop Lungs: clear to auscultation bilaterally Abdomen: soft, non-tender; bowel sounds normal; no  masses,  no organomegaly Extremities: extremities normal, atraumatic, no cyanosis or edema, Homans sign is negative, no sign of DVT and large bruise right cath site  Wound: bruising at cath site no palable FFA but echo tech here doing vein mapping and will echo femoral artery  Lab Results: CBC: Recent Labs  06/29/15 0506  WBC 5.2  HGB 9.0*  HCT 27.8*  PLT 87*   BMET:  Recent Labs  06/29/15 0506  NA 141  K 3.9  CL 98*  CO2 30  GLUCOSE 158*  BUN 34*  CREATININE 8.13*  CALCIUM 10.0    PT/INR: No results for input(s): LABPROT, INR in the last 72 hours.   Radiology No results found.   Assessment/Plan: S/P Procedure(s) (LRB): Left Heart Cath and Coronary Angiography (N/A) Check right femoral bruising with [place] Tentative plan cabg Tuesday after dialysis monday   Grace Isaac MD 06/30/2015 3:36 PM

## 2015-07-01 DIAGNOSIS — I200 Unstable angina: Secondary | ICD-10-CM | POA: Diagnosis not present

## 2015-07-01 LAB — GLUCOSE, CAPILLARY
GLUCOSE-CAPILLARY: 123 mg/dL — AB (ref 65–99)
Glucose-Capillary: 149 mg/dL — ABNORMAL HIGH (ref 65–99)
Glucose-Capillary: 142 mg/dL — ABNORMAL HIGH (ref 65–99)
Glucose-Capillary: 149 mg/dL — ABNORMAL HIGH (ref 65–99)

## 2015-07-01 NOTE — Progress Notes (Signed)
Patient Name:  Antonio Page, DOB: 1941-04-30, MRN: [id number] Primary Doctor: Geoffery Lyons, MD Primary Cardiologist:   Gwenlyn Found  Date: 07/01/2015   SUBJECTIVE:  The patient  Is s/p cardiac catheterization. There is a high grade calcified distal left main lesion. Decision is made that bypass surgery is the best option. Dr. Servando Snare has seen with plans for CABG on Tuesday. Dialysis limited yesterday due to hypotension. Denies any chest pain this am. No SOB. Prior to hospitalization was having angina with any activity.  Past Medical History  Diagnosis Date  . Hypertension   . GERD (gastroesophageal reflux disease)   . Arthritis   . Glaucoma   . Blind right eye   . Coronary artery disease     a. s/p PCI/stent-> RCA;  b. 02/2015 MV: infarct/no ischemia;  c. 06/2015 Cath: LM 80,LCX 95ost, 65m, RCA patent stent.  . Hypoxia 01/11/2015  . CHB (complete heart block) ([place]) 01/08/15    PPM placed MDT  . ESRD needing dialysis [facility])     MWF at [facility] (06/29/2015)  . Kidney stones   . Type II diabetes mellitus ([place])   . OSA on CPAP   . Presence of permanent cardiac pacemaker    Filed Vitals:   07/01/15 0013 07/01/15 0500 07/01/15 0758 07/01/15 0913  BP: 172/47 149/54 147/41 145/48  Pulse: 60 60 59   Temp: 98.6 F (37 C) 98.3 F (36.8 C) 98.4 F (36.9 C)   TempSrc:   Oral   Resp: 18 18 16    Height:      Weight:  275 lb 9.6 oz (125.011 kg)    SpO2: 92% 94% 97%     Intake/Output Summary (Last 24 hours) at 07/01/15 1115 Last data filed at 07/01/15 0900  Gross per 24 hour  Intake 1244.25 ml  Output      0 ml  Net 1244.25 ml   Filed Weights   06/29/15 1454 06/29/15 1500 07/01/15 0500  Weight: 272 lb 11.3 oz (123.7 kg) 272 lb 11.3 oz (123.7 kg) 275 lb 9.6 oz (125.011 kg)     LABS: Basic Metabolic Panel:  Recent Labs  06/29/15 0506  NA 141  K 3.9  CL 98*  CO2 30  GLUCOSE 158*  BUN 34*  CREATININE 8.13*  CALCIUM 10.0   Liver  Function Tests: No results for input(s): AST, ALT, ALKPHOS, BILITOT, PROT, ALBUMIN in the last 72 hours. No results for input(s): LIPASE, AMYLASE in the last 72 hours. CBC:  Recent Labs  06/29/15 0506  WBC 5.2  HGB 9.0*  HCT 27.8*  MCV 98.6  PLT 87*   Cardiac Enzymes: No results for input(s): CKTOTAL, CKMB, CKMBINDEX, TROPONINI in the last 72 hours. BNP: Invalid input(s): POCBNP D-Dimer: No results for input(s): DDIMER in the last 72 hours. Thyroid Function Tests: No results for input(s): TSH, T4TOTAL, T3FREE, THYROIDAB in the last 72 hours.  Invalid input(s): FREET3  RADIOLOGY: No results found.  PHYSICAL EXAM   GENERAL:  Obese WM in NAD HEENT:  PERRL, EOMI, sclera are clear. Oropharynx is clear. NECK:  No jugular venous distention, carotid upstroke brisk and symmetric, no bruits, no thyromegaly or adenopathy LUNGS:  Clear to auscultation bilaterally CHEST:  Unremarkable HEART:  RRR,  PMI not displaced or sustained,S1 and S2 within normal limits, no S3, no S4: no clicks, no rubs, no murmurs ABD:  Soft, obese, nontender. BS +, no masses or bruits.  EXT:  2 + pulses throughout,  tr edema, no cyanosis no clubbing SKIN:  Warm and dry.  No rashes NEURO:  Alert and oriented x 3. Cranial nerves II through XII intact. PSYCH:  Normal       ASSESSMENT AND PLAN: 1. Unstable angina with significant left main CAD. Angina improved on IV Ntg. Will stop Imdur while on IV Ntg. Continue beta blocker and amlodipine. For CABG on Tuesday. 2.   End stage renal disease on dialysis [facility])     Anticipate dialysis on Monday. 3. HTN. On Cozaar on non dialysis days only.  4. Chronic anemia of ESRD 5. Thrombocytopenia. 6. DM type 2. 7. History of CHB s/p pacemaker. Stable atrial pacing on monitor.  8. Hyperlipidemia on high dose statin.    Mertie Moores JMD,FACC  07/01/2015 11:15 AM

## 2015-07-01 NOTE — Progress Notes (Signed)
Pt is on CPAP at this time no distress or complications noted. Pt is stable at this time.

## 2015-07-01 NOTE — Progress Notes (Signed)
[facility] Progress Note   Subjective:   "I'm feeling good", no complaints  Objective Filed Vitals:   07/01/15 0013 07/01/15 0500 07/01/15 0758 07/01/15 0913  BP: 172/47 149/54 147/41 145/48  Pulse: 60 60 59   Temp: 98.6 F (37 C) 98.3 F (36.8 C) 98.4 F (36.9 C)   TempSrc:   Oral   Resp: 18 18 16    Height:      Weight:  125.011 kg (275 lb 9.6 oz)    SpO2: 92% 94% 97%    Physical Exam General: well nourished, awake, alert very pleasant NAD Heart: S1,S2, RRR  Lungs: Bilateral breath sounds CTA A/P Abdomen: Active BS nontender, nondistended Extremities: No LE edema. Pulses intact Dialysis Access: LUA AVF + Thrill + Bruit  Dialysis Orders: Center: [facility] on MWF . EDW 121.5 kg HD Bath 2.0 K 2.0 Ca Time 4 hours 15 minutes Heparin 4000 Units per tx. Access LUA AVF BFR 500 DFR 800 manual  Mircera 50 mcg IV q 2 weeks (last dose 06/20/2015) Hectoral 5 mcg IV q MWF  Additional Objective Labs: Basic Metabolic Panel:  Recent Labs Lab 06/26/15 1106 06/29/15 0506  NA 144 141  K 4.2 3.9  CL 100 98*  CO2 32* 30  GLUCOSE 105* 158*  BUN 25 34*  CREATININE 6.10* 8.13*  CALCIUM 9.9 10.0   Liver Function Tests: No results for input(s): AST, ALT, ALKPHOS, BILITOT, PROT, ALBUMIN in the last 168 hours. No results for input(s): LIPASE, AMYLASE in the last 168 hours. CBC:  Recent Labs Lab 06/26/15 1106 06/29/15 0506  WBC 6.8 5.2  NEUTROABS 5.0  --   HGB 10.7* 9.0*  HCT 32.8* 27.8*  MCV 96.5 98.6  PLT 116* 87*   Blood Culture    Component Value Date/Time   SDES BLOOD RIGHT HAND 06/16/2009 1913   SPECREQUEST BOTTLES DRAWN AEROBIC AND ANAEROBIC 5CC EA 06/16/2009 1913   CULT NO GROWTH 5 DAYS 06/16/2009 1913   REPTSTATUS 06/23/2009 FINAL 06/16/2009 1913    Cardiac Enzymes: No results for input(s): CKTOTAL, CKMB, CKMBINDEX, TROPONINI in the last 168 hours. CBG:  Recent Labs Lab 06/30/15 1109 06/30/15 1650 06/30/15 2058 07/01/15 0750  07/01/15 1147  GLUCAP 165* 163* 131* 149* 142*   Iron Studies: No results for input(s): IRON, TIBC, TRANSFERRIN, FERRITIN in the last 72 hours. @lablastinr3 @ Studies/Results: No results found. Medications: . nitroGLYCERIN 10 mcg/min (06/30/15 0700)   . allopurinol  150 mg Oral Daily  . amLODipine  10 mg Oral Daily  . antiseptic oral rinse  7 mL Mouth Rinse BID  . aspirin EC  81 mg Oral Daily  . atorvastatin  80 mg Oral q1800  . cinacalcet  30 mg Oral Q supper  . [START ON 07/02/2015] darbepoetin (ARANESP) injection - DIALYSIS  60 mcg Intravenous Q Mon-HD  . [START ON 07/02/2015] doxercalciferol  5 mcg Intravenous Q M,W,F-HD  . insulin aspart  0-15 Units Subcutaneous TID WC  . loratadine  10 mg Oral Daily  . losartan  100 mg Oral Once per day on Sun Tue Thu Sat  . multivitamin  1 tablet Oral QHS  . pantoprazole  80 mg Oral Daily  . sucroferric oxyhydroxide  1,000 mg Oral TID WC    Assessment: 1. CAD: 80% left main stenosis, 95% ostial circumflex. Referred to [facility] for CABG (Dr. Servando Snare). Plan for CABG Tuesday. 2. ESRD - MWF. Plan for HD Monday. Dry weight 121.5kg 3. Hypertension/volume - up 3-4 kg today 4. Anemia - HGB  9.0. Follow CBC. Will start ESA.  5. Metabolic bone disease - Continue sensipar, Hectoral 6. Nutrition - Carb mod/renal diet. Albumin 4.0 (06/06/15).  7. DM: Per primary.  Plan - HD tomorrow, UF to dry wt as tolerated  Kelly Splinter MD [place] pager [phone]    cell [phone] 07/01/2015, 12:34 PM

## 2015-07-02 ENCOUNTER — Inpatient Hospital Stay (HOSPITAL_COMMUNITY): Payer: Medicare Other

## 2015-07-02 ENCOUNTER — Ambulatory Visit (HOSPITAL_COMMUNITY): Payer: Medicare Other

## 2015-07-02 DIAGNOSIS — I132 Hypertensive heart and chronic kidney disease with heart failure and with stage 5 chronic kidney disease, or end stage renal disease: Secondary | ICD-10-CM | POA: Diagnosis present

## 2015-07-02 DIAGNOSIS — E119 Type 2 diabetes mellitus without complications: Secondary | ICD-10-CM | POA: Diagnosis not present

## 2015-07-02 DIAGNOSIS — R079 Chest pain, unspecified: Secondary | ICD-10-CM | POA: Diagnosis present

## 2015-07-02 DIAGNOSIS — H5441 Blindness, right eye, normal vision left eye: Secondary | ICD-10-CM | POA: Diagnosis present

## 2015-07-02 DIAGNOSIS — I200 Unstable angina: Secondary | ICD-10-CM | POA: Diagnosis not present

## 2015-07-02 DIAGNOSIS — Z950 Presence of cardiac pacemaker: Secondary | ICD-10-CM | POA: Diagnosis not present

## 2015-07-02 DIAGNOSIS — R410 Disorientation, unspecified: Secondary | ICD-10-CM | POA: Diagnosis not present

## 2015-07-02 DIAGNOSIS — G9341 Metabolic encephalopathy: Secondary | ICD-10-CM | POA: Diagnosis not present

## 2015-07-02 DIAGNOSIS — I442 Atrioventricular block, complete: Secondary | ICD-10-CM | POA: Diagnosis not present

## 2015-07-02 DIAGNOSIS — K761 Chronic passive congestion of liver: Secondary | ICD-10-CM | POA: Diagnosis not present

## 2015-07-02 DIAGNOSIS — E875 Hyperkalemia: Secondary | ICD-10-CM | POA: Diagnosis not present

## 2015-07-02 DIAGNOSIS — I2584 Coronary atherosclerosis due to calcified coronary lesion: Secondary | ICD-10-CM | POA: Diagnosis present

## 2015-07-02 DIAGNOSIS — K55069 Acute infarction of intestine, part and extent unspecified: Secondary | ICD-10-CM | POA: Diagnosis not present

## 2015-07-02 DIAGNOSIS — R6521 Severe sepsis with septic shock: Secondary | ICD-10-CM | POA: Diagnosis not present

## 2015-07-02 DIAGNOSIS — A419 Sepsis, unspecified organism: Secondary | ICD-10-CM | POA: Diagnosis not present

## 2015-07-02 DIAGNOSIS — R4182 Altered mental status, unspecified: Secondary | ICD-10-CM | POA: Diagnosis not present

## 2015-07-02 DIAGNOSIS — Z79899 Other long term (current) drug therapy: Secondary | ICD-10-CM | POA: Diagnosis not present

## 2015-07-02 DIAGNOSIS — K219 Gastro-esophageal reflux disease without esophagitis: Secondary | ICD-10-CM | POA: Diagnosis present

## 2015-07-02 DIAGNOSIS — A4150 Gram-negative sepsis, unspecified: Secondary | ICD-10-CM | POA: Diagnosis not present

## 2015-07-02 DIAGNOSIS — J189 Pneumonia, unspecified organism: Secondary | ICD-10-CM | POA: Diagnosis not present

## 2015-07-02 DIAGNOSIS — D696 Thrombocytopenia, unspecified: Secondary | ICD-10-CM | POA: Diagnosis present

## 2015-07-02 DIAGNOSIS — N10 Acute tubulo-interstitial nephritis: Secondary | ICD-10-CM | POA: Diagnosis not present

## 2015-07-02 DIAGNOSIS — Z87891 Personal history of nicotine dependence: Secondary | ICD-10-CM | POA: Diagnosis not present

## 2015-07-02 DIAGNOSIS — Z794 Long term (current) use of insulin: Secondary | ICD-10-CM | POA: Diagnosis not present

## 2015-07-02 DIAGNOSIS — G4733 Obstructive sleep apnea (adult) (pediatric): Secondary | ICD-10-CM | POA: Diagnosis present

## 2015-07-02 DIAGNOSIS — D631 Anemia in chronic kidney disease: Secondary | ICD-10-CM | POA: Diagnosis present

## 2015-07-02 DIAGNOSIS — I480 Paroxysmal atrial fibrillation: Secondary | ICD-10-CM | POA: Diagnosis not present

## 2015-07-02 DIAGNOSIS — E1122 Type 2 diabetes mellitus with diabetic chronic kidney disease: Secondary | ICD-10-CM | POA: Diagnosis present

## 2015-07-02 DIAGNOSIS — I2583 Coronary atherosclerosis due to lipid rich plaque: Secondary | ICD-10-CM | POA: Diagnosis present

## 2015-07-02 DIAGNOSIS — E785 Hyperlipidemia, unspecified: Secondary | ICD-10-CM | POA: Diagnosis present

## 2015-07-02 DIAGNOSIS — A4152 Sepsis due to Pseudomonas: Secondary | ICD-10-CM | POA: Diagnosis not present

## 2015-07-02 DIAGNOSIS — G9340 Encephalopathy, unspecified: Secondary | ICD-10-CM | POA: Diagnosis not present

## 2015-07-02 DIAGNOSIS — I25110 Atherosclerotic heart disease of native coronary artery with unstable angina pectoris: Secondary | ICD-10-CM | POA: Diagnosis present

## 2015-07-02 DIAGNOSIS — N2581 Secondary hyperparathyroidism of renal origin: Secondary | ICD-10-CM | POA: Diagnosis present

## 2015-07-02 DIAGNOSIS — K567 Ileus, unspecified: Secondary | ICD-10-CM | POA: Diagnosis not present

## 2015-07-02 DIAGNOSIS — Z955 Presence of coronary angioplasty implant and graft: Secondary | ICD-10-CM | POA: Diagnosis not present

## 2015-07-02 DIAGNOSIS — Z992 Dependence on renal dialysis: Secondary | ICD-10-CM | POA: Diagnosis not present

## 2015-07-02 DIAGNOSIS — N186 End stage renal disease: Secondary | ICD-10-CM | POA: Diagnosis present

## 2015-07-02 DIAGNOSIS — Y95 Nosocomial condition: Secondary | ICD-10-CM | POA: Diagnosis not present

## 2015-07-02 DIAGNOSIS — Z7982 Long term (current) use of aspirin: Secondary | ICD-10-CM | POA: Diagnosis not present

## 2015-07-02 DIAGNOSIS — B965 Pseudomonas (aeruginosa) (mallei) (pseudomallei) as the cause of diseases classified elsewhere: Secondary | ICD-10-CM | POA: Diagnosis not present

## 2015-07-02 DIAGNOSIS — R197 Diarrhea, unspecified: Secondary | ICD-10-CM | POA: Diagnosis not present

## 2015-07-02 DIAGNOSIS — I5042 Chronic combined systolic (congestive) and diastolic (congestive) heart failure: Secondary | ICD-10-CM | POA: Diagnosis not present

## 2015-07-02 DIAGNOSIS — D62 Acute posthemorrhagic anemia: Secondary | ICD-10-CM | POA: Diagnosis not present

## 2015-07-02 DIAGNOSIS — Z6838 Body mass index (BMI) 38.0-38.9, adult: Secondary | ICD-10-CM | POA: Diagnosis not present

## 2015-07-02 DIAGNOSIS — N12 Tubulo-interstitial nephritis, not specified as acute or chronic: Secondary | ICD-10-CM | POA: Diagnosis not present

## 2015-07-02 DIAGNOSIS — I639 Cerebral infarction, unspecified: Secondary | ICD-10-CM | POA: Diagnosis not present

## 2015-07-02 DIAGNOSIS — Z515 Encounter for palliative care: Secondary | ICD-10-CM | POA: Diagnosis not present

## 2015-07-02 DIAGNOSIS — E872 Acidosis: Secondary | ICD-10-CM | POA: Diagnosis not present

## 2015-07-02 DIAGNOSIS — N189 Chronic kidney disease, unspecified: Secondary | ICD-10-CM

## 2015-07-02 DIAGNOSIS — K7200 Acute and subacute hepatic failure without coma: Secondary | ICD-10-CM | POA: Diagnosis not present

## 2015-07-02 DIAGNOSIS — J9601 Acute respiratory failure with hypoxia: Secondary | ICD-10-CM | POA: Diagnosis not present

## 2015-07-02 DIAGNOSIS — Z66 Do not resuscitate: Secondary | ICD-10-CM | POA: Diagnosis not present

## 2015-07-02 DIAGNOSIS — K4090 Unilateral inguinal hernia, without obstruction or gangrene, not specified as recurrent: Secondary | ICD-10-CM | POA: Diagnosis not present

## 2015-07-02 DIAGNOSIS — M898X9 Other specified disorders of bone, unspecified site: Secondary | ICD-10-CM | POA: Diagnosis present

## 2015-07-02 DIAGNOSIS — I2510 Atherosclerotic heart disease of native coronary artery without angina pectoris: Secondary | ICD-10-CM | POA: Diagnosis not present

## 2015-07-02 LAB — CBC
HEMATOCRIT: 28.3 % — AB (ref 39.0–52.0)
HEMOGLOBIN: 9.2 g/dL — AB (ref 13.0–17.0)
MCH: 31.1 pg (ref 26.0–34.0)
MCHC: 32.5 g/dL (ref 30.0–36.0)
MCV: 95.6 fL (ref 78.0–100.0)
Platelets: 76 10*3/uL — ABNORMAL LOW (ref 150–400)
RBC: 2.96 MIL/uL — AB (ref 4.22–5.81)
RDW: 15.1 % (ref 11.5–15.5)
WBC: 5.9 10*3/uL (ref 4.0–10.5)

## 2015-07-02 LAB — BASIC METABOLIC PANEL
ANION GAP: 13 (ref 5–15)
BUN: 68 mg/dL — ABNORMAL HIGH (ref 6–20)
CALCIUM: 9.5 mg/dL (ref 8.9–10.3)
CO2: 26 mmol/L (ref 22–32)
Chloride: 99 mmol/L — ABNORMAL LOW (ref 101–111)
Creatinine, Ser: 12.15 mg/dL — ABNORMAL HIGH (ref 0.61–1.24)
GFR, EST AFRICAN AMERICAN: 4 mL/min — AB (ref >60)
GFR, EST NON AFRICAN AMERICAN: 3 mL/min — AB (ref >60)
GLUCOSE: 138 mg/dL — AB (ref 65–99)
POTASSIUM: 5 mmol/L (ref 3.5–5.1)
SODIUM: 138 mmol/L (ref 135–145)

## 2015-07-02 LAB — SPIROMETRY WITH GRAPH
FEF 25-75 Post: 1.44 L/sec
FEF 25-75 Pre: 1.75 L/sec
FEF2575-%Change-Post: -17 %
FEF2575-%Pred-Post: 66 %
FEF2575-%Pred-Pre: 80 %
FEV1-%Change-Post: -5 %
FEV1-%Pred-Post: 44 %
FEV1-%Pred-Pre: 47 %
FEV1-Post: 1.35 L
FEV1-Pre: 1.43 L
FEV1FVC-%Change-Post: 0 %
FEV1FVC-%Pred-Pre: 118 %
FEV6-%Change-Post: -6 %
FEV6-%Pred-Post: 39 %
FEV6-%Pred-Pre: 42 %
FEV6-Post: 1.56 L
FEV6-Pre: 1.67 L
FEV6FVC-%Change-Post: -1 %
FEV6FVC-%Pred-Post: 104 %
FEV6FVC-%Pred-Pre: 106 %
FVC-%Change-Post: -4 %
FVC-%Pred-Post: 37 %
FVC-%Pred-Pre: 39 %
FVC-Post: 1.59 L
FVC-Pre: 1.67 L
Post FEV1/FVC ratio: 85 %
Post FEV6/FVC ratio: 98 %
Pre FEV1/FVC ratio: 85 %
Pre FEV6/FVC Ratio: 100 %

## 2015-07-02 LAB — GLUCOSE, CAPILLARY
GLUCOSE-CAPILLARY: 121 mg/dL — AB (ref 65–99)
GLUCOSE-CAPILLARY: 115 mg/dL — AB (ref 65–99)
Glucose-Capillary: 116 mg/dL — ABNORMAL HIGH (ref 65–99)
Glucose-Capillary: 148 mg/dL — ABNORMAL HIGH (ref 65–99)

## 2015-07-02 LAB — PULMONARY FUNCTION TEST
FEF 25-75 Post: 1.44 L/sec
FEF 25-75 Pre: 1.75 L/sec
FEF2575-%Change-Post: -17 %
FEF2575-%Pred-Post: 66 %
FEF2575-%Pred-Pre: 80 %
FEV1-%Change-Post: -5 %
FEV1-%Pred-Post: 44 %
FEV1-%Pred-Pre: 47 %
FEV1-Post: 1.35 L
FEV1-Pre: 1.43 L
FEV1FVC-%Change-Post: 0 %
FEV1FVC-%Pred-Pre: 118 %
FEV6-%Change-Post: -6 %
FEV6-%Pred-Post: 39 %
FEV6-%Pred-Pre: 42 %
FEV6-Post: 1.56 L
FEV6-Pre: 1.67 L
FEV6FVC-%Change-Post: -1 %
FEV6FVC-%Pred-Post: 104 %
FEV6FVC-%Pred-Pre: 106 %
FVC-%Change-Post: -4 %
FVC-%Pred-Post: 37 %
FVC-%Pred-Pre: 39 %
FVC-Post: 1.59 L
Post FEV1/FVC ratio: 85 %
Post FEV6/FVC ratio: 98 %
Pre FEV1/FVC ratio: 85 %
Pre FEV6/FVC Ratio: 100 %

## 2015-07-02 LAB — URINE MICROSCOPIC-ADD ON

## 2015-07-02 LAB — URINALYSIS, ROUTINE W REFLEX MICROSCOPIC
Bilirubin Urine: NEGATIVE
Glucose, UA: 100 mg/dL — AB
Ketones, ur: NEGATIVE mg/dL
Nitrite: NEGATIVE
Protein, ur: 100 mg/dL — AB
Specific Gravity, Urine: 1.008 (ref 1.005–1.030)
Urobilinogen, UA: 0.2 mg/dL (ref 0.0–1.0)
pH: 8 (ref 5.0–8.0)

## 2015-07-02 MED ORDER — DEXMEDETOMIDINE HCL IN NACL 400 MCG/100ML IV SOLN
0.1000 ug/kg/h | INTRAVENOUS | Status: AC
  Administered 2015-07-03: .3 ug/kg/h via INTRAVENOUS
  Filled 2015-07-02: qty 100

## 2015-07-02 MED ORDER — ALBUTEROL SULFATE (2.5 MG/3ML) 0.083% IN NEBU
2.5000 mg | INHALATION_SOLUTION | Freq: Once | RESPIRATORY_TRACT | Status: AC
  Administered 2015-07-02: 2.5 mg via RESPIRATORY_TRACT

## 2015-07-02 MED ORDER — TEMAZEPAM 15 MG PO CAPS: 15.0000 mg | ORAL_CAPSULE | Freq: Once | ORAL | Status: DC | PRN

## 2015-07-02 MED ORDER — PHENYLEPHRINE HCL 10 MG/ML IJ SOLN
30.0000 ug/min | INTRAVENOUS | Status: AC
  Administered 2015-07-03: 20 ug/min via INTRAVENOUS
  Filled 2015-07-02: qty 2

## 2015-07-02 MED ORDER — SODIUM CHLORIDE 0.9 % IV SOLN
INTRAVENOUS | Status: AC
  Administered 2015-07-03: 69.8 mL/h via INTRAVENOUS
  Filled 2015-07-02: qty 40

## 2015-07-02 MED ORDER — BISACODYL 5 MG PO TBEC: 5.0000 mg | DELAYED_RELEASE_TABLET | Freq: Once | ORAL | Status: DC

## 2015-07-02 MED ORDER — CEFUROXIME SODIUM 1.5 G IJ SOLR
1.5000 g | INTRAMUSCULAR | Status: AC
  Administered 2015-07-03: .75 g via INTRAVENOUS
  Administered 2015-07-03: 1.5 g via INTRAVENOUS
  Filled 2015-07-02: qty 1.5

## 2015-07-02 MED ORDER — EPINEPHRINE HCL 1 MG/ML IJ SOLN
0.0000 ug/min | INTRAMUSCULAR | Status: DC
  Filled 2015-07-02: qty 4

## 2015-07-02 MED ORDER — DOPAMINE-DEXTROSE 3.2-5 MG/ML-% IV SOLN
0.0000 ug/kg/min | INTRAVENOUS | Status: DC
Start: 2015-07-03 — End: 2015-07-03
  Filled 2015-07-02: qty 250

## 2015-07-02 MED ORDER — MAGNESIUM SULFATE 50 % IJ SOLN
40.0000 meq | INTRAMUSCULAR | Status: DC
Start: 2015-07-03 — End: 2015-07-03
  Filled 2015-07-02: qty 10

## 2015-07-02 MED ORDER — SODIUM CHLORIDE 0.9 % IV SOLN
INTRAVENOUS | Status: AC
  Administered 2015-07-03: 1 [IU]/h via INTRAVENOUS
  Filled 2015-07-02: qty 2.5

## 2015-07-02 MED ORDER — SODIUM CHLORIDE 0.9 % IV SOLN
INTRAVENOUS | Status: DC
  Filled 2015-07-02: qty 30

## 2015-07-02 MED ORDER — VANCOMYCIN HCL 10 G IV SOLR
1500.0000 mg | INTRAVENOUS | Status: AC
  Administered 2015-07-03: 1500 mg via INTRAVENOUS
  Filled 2015-07-02: qty 1500

## 2015-07-02 MED ORDER — NITROGLYCERIN IN D5W 200-5 MCG/ML-% IV SOLN
2.0000 ug/min | INTRAVENOUS | Status: AC
  Administered 2015-07-03: 10 ug/min via INTRAVENOUS
  Filled 2015-07-02: qty 250

## 2015-07-02 MED ORDER — CHLORHEXIDINE GLUCONATE CLOTH 2 % EX PADS
6.0000 | MEDICATED_PAD | Freq: Once | CUTANEOUS | Status: AC
  Administered 2015-07-03: 6 via TOPICAL

## 2015-07-02 MED ORDER — DEXTROSE 5 % IV SOLN
750.0000 mg | INTRAVENOUS | Status: DC
  Filled 2015-07-02: qty 750

## 2015-07-02 MED ORDER — CHLORHEXIDINE GLUCONATE 0.12 % MT SOLN: 15.0000 mL | Freq: Once | OROMUCOSAL | Status: DC

## 2015-07-02 MED ORDER — PLASMA-LYTE 148 IV SOLN
INTRAVENOUS | Status: AC
  Administered 2015-07-03: 500 mL
  Filled 2015-07-02: qty 2.5

## 2015-07-02 MED ORDER — POTASSIUM CHLORIDE 2 MEQ/ML IV SOLN
80.0000 meq | INTRAVENOUS | Status: DC
  Filled 2015-07-02: qty 40

## 2015-07-02 MED ORDER — DARBEPOETIN ALFA 60 MCG/0.3ML IJ SOSY
PREFILLED_SYRINGE | INTRAMUSCULAR | Status: AC
  Administered 2015-07-02: 60 ug via INTRAVENOUS
  Filled 2015-07-02: qty 0.3

## 2015-07-02 MED ORDER — DOXERCALCIFEROL 4 MCG/2ML IV SOLN
INTRAVENOUS | Status: AC
  Filled 2015-07-02: qty 4

## 2015-07-02 MED ORDER — CHLORHEXIDINE GLUCONATE CLOTH 2 % EX PADS
6.0000 | MEDICATED_PAD | Freq: Once | CUTANEOUS | Status: AC
  Administered 2015-07-02: 6 via TOPICAL

## 2015-07-02 MED ORDER — METOPROLOL TARTRATE 12.5 MG HALF TABLET
12.5000 mg | ORAL_TABLET | Freq: Once | ORAL | Status: AC
  Administered 2015-07-03: 12.5 mg via ORAL
  Filled 2015-07-02: qty 1

## 2015-07-02 NOTE — Progress Notes (Signed)
Patient Name: Antonio Page Date of Encounter: 07/02/2015  Primary Cardiologist: Dr. Gwenlyn Found   Principal Problem:   Unstable angina [facility]) Active Problems:   Morbid obesity (HCC)   OSA (obstructive sleep apnea)   Diabetes mellitus type 2, insulin dependent (HCC)   Uncontrolled hypertension   Coronary artery disease   End stage renal disease on dialysis ([place])   Anemia in chronic kidney disease   Complete heart block, temp pacemaker, s/p PPM MDT dual chamber 5/4/1t6    SUBJECTIVE  Denies any CP overnight. Had mild SOB after arrival at dialysis earlier.  CURRENT MEDS . allopurinol  150 mg Oral Daily  . amLODipine  10 mg Oral Daily  . antiseptic oral rinse  7 mL Mouth Rinse BID  . aspirin EC  81 mg Oral Daily  . atorvastatin  80 mg Oral q1800  . cinacalcet  30 mg Oral Q supper  . darbepoetin (ARANESP) injection - DIALYSIS  60 mcg Intravenous Q Mon-HD  . doxercalciferol  5 mcg Intravenous Q M,W,F-HD  . insulin aspart  0-15 Units Subcutaneous TID WC  . loratadine  10 mg Oral Daily  . losartan  100 mg Oral Once per day on Sun Tue Thu Sat  . multivitamin  1 tablet Oral QHS  . pantoprazole  80 mg Oral Daily  . sucroferric oxyhydroxide  1,000 mg Oral TID WC    OBJECTIVE  Filed Vitals:   07/01/15 2100 07/01/15 2311 07/02/15 0500 07/02/15 0744  BP: 143/46  158/49 177/47  Pulse: 59 62 60 61  Temp: 98.7 F (37.1 C)  98 F (36.7 C) 98 F (36.7 C)  TempSrc:    Oral  Resp: 19 16 20 20   Height:      Weight:   278 lb (126.1 kg)   SpO2: 94% 95% 98% 98%    Intake/Output Summary (Last 24 hours) at 07/02/15 0921 Last data filed at 07/02/15 0745  Gross per 24 hour  Intake    360 ml  Output      0 ml  Net    360 ml   Filed Weights   06/29/15 1500 07/01/15 0500 07/02/15 0500  Weight: 272 lb 11.3 oz (123.7 kg) 275 lb 9.6 oz (125.011 kg) 278 lb (126.1 kg)    PHYSICAL EXAM  General: Pleasant, NAD. Neuro: Alert and oriented X 3. Moves all extremities  spontaneously. Psych: Normal affect. HEENT:  Normal  Neck: Supple without bruits or JVD. Lungs:  Resp regular and unlabored, CTA. Heart: RRR no s3, s4, or murmurs. Abdomen: Soft, non-tender, non-distended, BS + x 4.  Extremities: No clubbing, cyanosis or edema. DP/PT/Radials 2+ and equal bilaterally.  Accessory Clinical Findings  TELE NSR without significant ventricular ectopy    ECG  No new EKG  Echocardiogram 01/08/2015  LV EF: 55% -  60%  ------------------------------------------------------------------- Indications:   Syncope 780.2.  ------------------------------------------------------------------- History:  PMH:  Coronary artery disease. Risk factors: Obstructive sleep apnea. End stage renal disease. Hypertension. Diabetes mellitus.  ------------------------------------------------------------------- Study Conclusions  - Left ventricle: The cavity size was normal. Wall thickness was increased in a pattern of mild LVH. Systolic function was normal. The estimated ejection fraction was in the range of 55% to 60%. Wall motion was normal; there were no regional wall motion abnormalities. - Mitral valve: Calcified annulus. There was mild regurgitation. - Left atrium: The atrium was moderately dilated.  Impressions:  - Incomplete study. Study was terminated secondary to periods of asystole.     Radiology/Studies  No results found.  ASSESSMENT AND PLAN  Antonio Page is a 74 y.o. 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 Cathed on 10/20 as outpatient, found to have significant calcified LM dx, planning for CABG  1. Class III angina  - cath 80% calcified distal LM, 95% ost LCx, 50% mid LCx. Plan for CABG  - plan for CABG tomorrow AM, per OR schedule, at  7:15am  2. CAD s/p stent to RCA 1999  3. H/o complete HB s/p PPM  3. HTN 4. DM 5. ESRD on HD MWF  - HD today 6. OSA  Signed, Almyra Deforest PA-C Pager: [phone]   The patient was seen, examined and discussed with Almyra Deforest, PA-C and I agree with the above.   74 year old male with known CAD, admitted with UA, LM disease on cath from 10/20, awaiting CABG that is scheduled tomorrow. HD today. Continue ASA, losartan, amlodipine, no BB as bradycardic at baseline.   Dorothy Spark 07/02/2015

## 2015-07-02 NOTE — Progress Notes (Signed)
Patient ID: Antonio Page, male   DOB: Feb 15, 1941, 74 y.o.   MRN: [id number]      [address]       ,[place] [address]             [phone]                 4 Days Post-Op Procedure(s) (LRB): Left Heart Cath and Coronary Angiography (N/A)  LOS: 0 days   Subjective: Complaint of head ache while on dialysis also on ntg drip  Objective: Vital signs in last 24 hours: Patient Vitals for the past 24 hrs:  BP Temp Temp src Pulse Resp SpO2 Weight  07/02/15 1338 (!) 156/50 mmHg - - - - - -  07/02/15 1240 (!) 154/47 mmHg 98.5 F (36.9 C) Oral 63 18 99 % -  07/02/15 1130 (!) 144/59 mmHg 98.4 F (36.9 C) Oral 71 19 97 % -  07/02/15 1100 (!) 135/57 mmHg - - 69 15 - -  07/02/15 1030 (!) 136/53 mmHg - - 65 11 - -  07/02/15 1000 (!) 157/73 mmHg - - 64 15 - -  07/02/15 0940 (!) 161/68 mmHg - - 65 11 98 % -  07/02/15 0930 (!) 170/65 mmHg 98.2 F (36.8 C) Oral 64 13 98 % 282 lb 3 oz (128 kg)  07/02/15 0744 (!) 177/47 mmHg 98 F (36.7 C) Oral 61 20 98 % -  07/02/15 0500 (!) 158/49 mmHg 98 F (36.7 C) - 60 20 98 % 278 lb (126.1 kg)  07/01/15 2311 - - - 62 16 95 % -  07/01/15 2100 (!) 143/46 mmHg 98.7 F (37.1 C) - (!) 59 19 94 % -    Filed Weights   07/01/15 0500 07/02/15 0500 07/02/15 0930  Weight: 275 lb 9.6 oz (125.011 kg) 278 lb (126.1 kg) 282 lb 3 oz (128 kg)    Hemodynamic parameters for last 24 hours:    Intake/Output from previous day: 10/23 0701 - 10/24 0700 In: 600 [P.O.:600] Out: -  Intake/Output this shift: Total I/O In: 120 [P.O.:120] Out: 245 [Other:245]  Scheduled Meds: . allopurinol  150 mg Oral Daily  . [START ON 06/28/2015] aminocaproic acid (AMICAR) for OHS   Intravenous To OR  . amLODipine  10 mg Oral Daily  . antiseptic oral rinse  7 mL Mouth Rinse BID  . aspirin EC  81 mg Oral Daily  . atorvastatin  80 mg Oral q1800  . [START ON 06/29/2015] cefUROXime (ZINACEF)  IV  1.5 g Intravenous To OR  . [START ON 07/02/2015] cefUROXime  (ZINACEF)  IV  750 mg Intravenous To OR  . cinacalcet  30 mg Oral Q supper  . darbepoetin (ARANESP) injection - DIALYSIS  60 mcg Intravenous Q Mon-HD  . [START ON 06/26/2015] dexmedetomidine  0.1-0.7 mcg/kg/hr Intravenous To OR  . [START ON 07/07/2015] DOPamine  0-10 mcg/kg/min Intravenous To OR  . doxercalciferol  5 mcg Intravenous Q M,W,F-HD  . [START ON 06/16/2015] epinephrine  0-10 mcg/min Intravenous To OR  . [START ON 06/21/2015] heparin-papaverine-plasmalyte irrigation   Irrigation To OR  . [START ON 06/13/2015] heparin 30,000 units/NS 1000 mL solution for CELLSAVER   Other To OR  . insulin aspart  0-15 Units Subcutaneous TID WC  . [START ON 06/12/2015] insulin (NOVOLIN-R) infusion   Intravenous To OR  . loratadine  10 mg Oral Daily  . losartan  100 mg Oral Once per day on Sun Tue Thu Sat  . [  START ON 06/20/2015] magnesium sulfate  40 mEq Other To OR  . multivitamin  1 tablet Oral QHS  . [START ON 06/22/2015] nitroGLYCERIN  2-200 mcg/min Intravenous To OR  . pantoprazole  80 mg Oral Daily  . [START ON 06/20/2015] phenylephrine (NEO-SYNEPHRINE) Adult infusion  30-200 mcg/min Intravenous To OR  . [START ON 06/30/2015] potassium chloride  80 mEq Other To OR  . sucroferric oxyhydroxide  1,000 mg Oral TID WC  . [START ON 06/12/2015] vancomycin  1,500 mg Intravenous To OR   Continuous Infusions: . nitroGLYCERIN 10 mcg/min (06/30/15 0700)   PRN Meds:.sodium chloride, acetaminophen, hydrALAZINE, morphine injection, nitroGLYCERIN, ondansetron (ZOFRAN) IV, polyethylene glycol, sodium chloride, sodium chloride  General appearance: alert and cooperative Neurologic: intact Heart: regular rate and rhythm, S1, S2 normal, no murmur, click, rub or gallop Lungs: clear to auscultation bilaterally Abdomen: soft, non-tender; bowel sounds normal; no masses,  no organomegaly Extremities: extremities normal, atraumatic, no cyanosis or edema and Homans sign is negative, no sign of DVT  Lab  Results: CBC: Recent Labs  07/02/15 0940  WBC 5.9  HGB 9.2*  HCT 28.3*  PLT 76*   BMET:  Recent Labs  07/02/15 0940  NA 138  K 5.0  CL 99*  CO2 26  GLUCOSE 138*  BUN 68*  CREATININE 12.15*  CALCIUM 9.5    PT/INR: No results for input(s): LABPROT, INR in the last 72 hours.   Radiology No results found.   Assessment/Plan: S/P Procedure(s) (LRB): Left Heart Cath and Coronary Angiography (N/A) Plan CABG in am with left min and symptoms Patient at increased rish due to chronic renal failure and low plts and moderate pulmonary disease  The goals risks and alternatives of the planned surgical procedure CABG have been discussed with the patient in detail. The risks of the procedure including death, infection, stroke, myocardial infarction, bleeding, blood transfusion have all been discussed specifically.  I have quoted Antonio Page a 6 % of perioperative mortality and a complication rate as high as 40%. The patient's questions have been answered.Antonio Page is willing  to proceed with the planned procedure.  Antonio Isaac MD 07/02/2015 5:36 PM

## 2015-07-02 NOTE — Progress Notes (Signed)
Patient has home CPAP set up at bedside. Patient states he is able to place himself on/off as needed. 2L O2 bled in.  Encouraged patient to call for Respiratory is assistance needed.  RN aware.

## 2015-07-02 NOTE — Procedures (Signed)
2 hours of 4 hour hemodialysis treatment completed. At 2 hour mark, patient c/o increase in SOB, nausea, and dizziness. BP checked with result of 112/60. UF turned off and MD notified. MD advised to keep UF off in order to allow SBP to come back up to 120s-140s. Informed patient that fluid removal was stopped and that the MD would like to continue treatment but just clean blood at this time. Patient becoming more anxious and stated "I want you to take me off treatment" several moments later. Educated patient on the need to complete treatment in preparation for surgery tomorrow morning but patient still expressing desire to end treatment. AMA form signed and treatment discontinued. MD notified.

## 2015-07-02 NOTE — Anesthesia Preprocedure Evaluation (Addendum)
Anesthesia Evaluation  Patient identified by MRN, date of birth, ID band Patient awake    Reviewed: Allergy & Precautions, H&P , NPO status , Patient's Chart, lab work & pertinent test results  History of Anesthesia Complications Negative for: history of anesthetic complications  Airway Mallampati: II  TM Distance: >3 FB Neck ROM: Full    Dental  (+) Teeth Intact   Pulmonary sleep apnea and Continuous Positive Airway Pressure Ventilation , COPD, former smoker,     + decreased breath sounds      Cardiovascular hypertension, Pt. on medications + angina + CAD and + Cardiac Stents  + dysrhythmias + pacemaker  Rhythm:Regular Rate:Normal     Neuro/Psych    GI/Hepatic GERD  Medicated and Controlled,  Endo/Other  diabetes  Renal/GU ESRF and DialysisRenal diseaseHD m/w/f     Musculoskeletal  (+) Arthritis ,   Abdominal   Peds  Hematology  (+) anemia ,   Anesthesia Other Findings   Reproductive/Obstetrics                          Anesthesia Physical  Anesthesia Plan  ASA: IV  Anesthesia Plan: General   Post-op Pain Management:    Induction: Intravenous  Airway Management Planned: Oral ETT  Additional Equipment: Arterial line, PA Cath, 3D TEE, Ultrasound Guidance Line Placement, TEE and CVP  Intra-op Plan:   Post-operative Plan: Post-operative intubation/ventilation  Informed Consent: I have reviewed the patients History and Physical, chart, labs and discussed the procedure including the risks, benefits and alternatives for the proposed anesthesia with the patient or authorized representative who has indicated his/her understanding and acceptance.   Dental advisory given  Plan Discussed with: CRNA  Anesthesia Plan Comments:         Anesthesia Quick Evaluation

## 2015-07-02 NOTE — Procedures (Signed)
I have personally attended this patient's dialysis session.   R AVF cannulated without difficulty On 4K bath Pre HD BP 170's Gentle vol removal to avoid hypotension (last TMT was abbreviated d/t same) (about 5 kg over EDW) No labs yet To get Aranesp 60 today  Jamal Maes, MD [place] Pager 07/02/2015, 9:55 AM

## 2015-07-02 NOTE — Progress Notes (Signed)
  [facility] Progress Note   Subjective:    Seen in dialysis Last TMT was limited d/t hypotension Despite up 5 kg going for a gentle goal today to avoid BP drops if possible. Wants to "get the show on the road tomorrow" (CABG) Headache from nitrates  Physical Exam BP 177/47 mmHg  Pulse 61  Temp(Src) 98 F (36.7 C) (Oral)  Resp 20  Ht 5\' 11"  (1.803 m)  Wt 126.1 kg (278 lb)  BMI 38.79 kg/m2  SpO2 98%  General: well nourished, awake, alert very pleasant NAD - dialysis currently being initiated Heart: S1,S2, Regular. No audible murmur.   Pacemaker in place NT left side Lungs: Bilateral breath sounds clear without crackles Abdomen: Active BS nontender, nondistended Extremities: 1+ woody LE edema.  Dialysis Access: RUA AVF currently cannulated for dialysis   Additional Objective Labs: Basic Metabolic Panel:  Recent Labs Lab 06/26/15 1106 06/29/15 0506  NA 144 141  K 4.2 3.9  CL 100 98*  CO2 32* 30  GLUCOSE 105* 158*  BUN 25 34*  CREATININE 6.10* 8.13*  CALCIUM 9.9 10.0    Recent Labs Lab 06/26/15 1106 06/29/15 0506  WBC 6.8 5.2  NEUTROABS 5.0  --   HGB 10.7* 9.0*  HCT 32.8* 27.8*  MCV 96.5 98.6  PLT 116* 87*     Recent Labs Lab 07/01/15 0750 07/01/15 1147 07/01/15 1649 07/01/15 2102 07/02/15 0722  GLUCAP 149* 142* 149* 123* 116*   Medications: . nitroGLYCERIN 10 mcg/min (06/30/15 0700)   . allopurinol  150 mg Oral Daily  . amLODipine  10 mg Oral Daily  . antiseptic oral rinse  7 mL Mouth Rinse BID  . aspirin EC  81 mg Oral Daily  . atorvastatin  80 mg Oral q1800  . cinacalcet  30 mg Oral Q supper  . darbepoetin (ARANESP) injection - DIALYSIS  60 mcg Intravenous Q Mon-HD  . doxercalciferol  5 mcg Intravenous Q M,W,F-HD  . insulin aspart  0-15 Units Subcutaneous TID WC  . loratadine  10 mg Oral Daily  . losartan  100 mg Oral Once per day on Sun Tue Thu Sat  . multivitamin  1 tablet Oral QHS  . pantoprazole  80 mg Oral Daily   . sucroferric oxyhydroxide  1,000 mg Oral TID WC   Dialysis Orders: Center: [facility] on MWF . EDW 121.5 kg HD Bath 2.0 K 2.0 Ca Time 4 hours 15 minutes Heparin 4000 Units per tx. Access RUA AVF BFR 500 DFR 800 manual  Mircera 50 mcg IV q 2 weeks (last dose 06/20/2015) Hectoral 5 mcg IV q MWF  Assessment: 1. CAD/unstable angina. Cath with  80% calcified left main stenosis, 95% ostial circumflex. Plan for CABG Tuesday by Dr. Servando Snare.Remains on IV nitro. Last cards note says BB and amlodipine - I do not see a BB - he is on ARB and CCB. 2. 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 ESRD - MWF. Dry weight 121.5kg About 5 kg up and preHD BP is 250'N systolic. Volume removal as tolerated. Avoid hypotension. 4. Anemia - Last Hb 9.0. Follow CBC. Starting ESA (darbe 66 Qmonday; last outpt dose of Mircera was 06/20/15) .  5. Metabolic bone disease - Continue sensipar, Hectoral, binders  6. Nutrition - Carb mod/renal diet. Albumin 4.0 (06/06/15).  7. DM: Per primary. 8. HLD - on atorvastatin  Jamal Maes, MD [facility] [phone] Pager 07/02/2015, 9:46 AM

## 2015-07-03 ENCOUNTER — Inpatient Hospital Stay (HOSPITAL_COMMUNITY): Payer: Medicare Other | Admitting: Certified Registered Nurse Anesthetist

## 2015-07-03 ENCOUNTER — Inpatient Hospital Stay (HOSPITAL_COMMUNITY): Payer: Medicare Other

## 2015-07-03 ENCOUNTER — Encounter (HOSPITAL_COMMUNITY): Admission: RE | Disposition: E | Payer: Medicare Other | Source: Ambulatory Visit | Attending: Cardiothoracic Surgery

## 2015-07-03 HISTORY — PX: TEE WITHOUT CARDIOVERSION: SHX5443

## 2015-07-03 HISTORY — PX: CORONARY ARTERY BYPASS GRAFT: SHX141

## 2015-07-03 LAB — POCT I-STAT 3, ART BLOOD GAS (G3+)
ACID-BASE DEFICIT: 3 mmol/L — AB (ref 0.0–2.0)
ACID-BASE DEFICIT: 3 mmol/L — AB (ref 0.0–2.0)
ACID-BASE DEFICIT: 5 mmol/L — AB (ref 0.0–2.0)
Acid-Base Excess: 5 mmol/L — ABNORMAL HIGH (ref 0.0–2.0)
Acid-base deficit: 6 mmol/L — ABNORMAL HIGH (ref 0.0–2.0)
BICARBONATE: 28.5 meq/L — AB (ref 20.0–24.0)
BICARBONATE: 21.4 meq/L (ref 20.0–24.0)
BICARBONATE: 22.6 meq/L (ref 20.0–24.0)
BICARBONATE: 21.2 meq/L (ref 20.0–24.0)
BICARBONATE: 20.6 meq/L (ref 20.0–24.0)
O2 SAT: 96 %
O2 Saturation: 100 %
O2 Saturation: 100 %
O2 Saturation: 94 %
O2 Saturation: 96 %
PCO2 ART: 42.2 mmHg (ref 35.0–45.0)
PCO2 ART: 46.1 mmHg — AB (ref 35.0–45.0)
PH ART: 7.471 — AB (ref 7.350–7.450)
PH ART: 7.335 — AB (ref 7.350–7.450)
PH ART: 7.27 — AB (ref 7.350–7.450)
PH ART: 7.317 — AB (ref 7.350–7.450)
PO2 ART: 324 mmHg — AB (ref 80.0–100.0)
PO2 ART: 87 mmHg (ref 80.0–100.0)
PO2 ART: 79 mmHg — AB (ref 80.0–100.0)
Patient temperature: 36.7
Patient temperature: 37
TCO2: 30 mmol/L (ref 0–100)
TCO2: 22 mmol/L (ref 0–100)
TCO2: 24 mmol/L (ref 0–100)
TCO2: 23 mmol/L (ref 0–100)
TCO2: 22 mmol/L (ref 0–100)
pCO2 arterial: 39.1 mmHg (ref 35.0–45.0)
pCO2 arterial: 35.2 mmHg (ref 35.0–45.0)
pCO2 arterial: 40.5 mmHg (ref 35.0–45.0)
pH, Arterial: 7.393 (ref 7.350–7.450)
pO2, Arterial: 215 mmHg — ABNORMAL HIGH (ref 80.0–100.0)
pO2, Arterial: 89 mmHg (ref 80.0–100.0)

## 2015-07-03 LAB — BLOOD GAS, ARTERIAL
Acid-Base Excess: 1.1 mmol/L (ref 0.0–2.0)
Bicarbonate: 25.5 mEq/L — ABNORMAL HIGH (ref 20.0–24.0)
Drawn by: 347621
FIO2: 0.21
O2 Saturation: 91.7 %
Patient temperature: 98.4
TCO2: 26.8 mmol/L (ref 0–100)
pCO2 arterial: 42.8 mmHg (ref 35.0–45.0)
pH, Arterial: 7.392 (ref 7.350–7.450)
pO2, Arterial: 66.4 mmHg — ABNORMAL LOW (ref 80.0–100.0)

## 2015-07-03 LAB — CBC
HCT: 29.8 % — ABNORMAL LOW (ref 39.0–52.0)
HCT: 25.8 % — ABNORMAL LOW (ref 39.0–52.0)
HCT: 28.9 % — ABNORMAL LOW (ref 39.0–52.0)
HEMOGLOBIN: 8.6 g/dL — AB (ref 13.0–17.0)
Hemoglobin: 9.7 g/dL — ABNORMAL LOW (ref 13.0–17.0)
Hemoglobin: 9.4 g/dL — ABNORMAL LOW (ref 13.0–17.0)
MCH: 31.2 pg (ref 26.0–34.0)
MCH: 31.7 pg (ref 26.0–34.0)
MCH: 31.5 pg (ref 26.0–34.0)
MCHC: 32.6 g/dL (ref 30.0–36.0)
MCHC: 33.3 g/dL (ref 30.0–36.0)
MCHC: 32.5 g/dL (ref 30.0–36.0)
MCV: 95.8 fL (ref 78.0–100.0)
MCV: 95.2 fL (ref 78.0–100.0)
MCV: 97 fL (ref 78.0–100.0)
PLATELETS: 64 10*3/uL — AB (ref 150–400)
PLATELETS: 81 10*3/uL — AB (ref 150–400)
Platelets: 113 10*3/uL — ABNORMAL LOW (ref 150–400)
RBC: 3.11 MIL/uL — AB (ref 4.22–5.81)
RBC: 2.71 MIL/uL — ABNORMAL LOW (ref 4.22–5.81)
RBC: 2.98 MIL/uL — ABNORMAL LOW (ref 4.22–5.81)
RDW: 15.2 % (ref 11.5–15.5)
RDW: 15.2 % (ref 11.5–15.5)
RDW: 15.4 % (ref 11.5–15.5)
WBC: 4.9 10*3/uL (ref 4.0–10.5)
WBC: 8.8 10*3/uL (ref 4.0–10.5)
WBC: 15.8 10*3/uL — ABNORMAL HIGH (ref 4.0–10.5)

## 2015-07-03 LAB — POCT I-STAT, CHEM 8
BUN: 56 mg/dL — ABNORMAL HIGH (ref 6–20)
BUN: 51 mg/dL — ABNORMAL HIGH (ref 6–20)
BUN: 55 mg/dL — ABNORMAL HIGH (ref 6–20)
BUN: 49 mg/dL — AB (ref 6–20)
BUN: 50 mg/dL — AB (ref 6–20)
BUN: 54 mg/dL — ABNORMAL HIGH (ref 6–20)
CALCIUM ION: 1.31 mmol/L — AB (ref 1.13–1.30)
CALCIUM ION: 1.19 mmol/L (ref 1.13–1.30)
CHLORIDE: 99 mmol/L — AB (ref 101–111)
CHLORIDE: 99 mmol/L — AB (ref 101–111)
CHLORIDE: 106 mmol/L (ref 101–111)
CREATININE: 9.3 mg/dL — AB (ref 0.61–1.24)
CREATININE: 8.7 mg/dL — AB (ref 0.61–1.24)
CREATININE: 8.5 mg/dL — AB (ref 0.61–1.24)
Calcium, Ion: 1.13 mmol/L (ref 1.13–1.30)
Calcium, Ion: 1.16 mmol/L (ref 1.13–1.30)
Calcium, Ion: 1.44 mmol/L — ABNORMAL HIGH (ref 1.13–1.30)
Calcium, Ion: 1.28 mmol/L (ref 1.13–1.30)
Chloride: 101 mmol/L (ref 101–111)
Chloride: 103 mmol/L (ref 101–111)
Chloride: 106 mmol/L (ref 101–111)
Creatinine, Ser: 10.1 mg/dL — ABNORMAL HIGH (ref 0.61–1.24)
Creatinine, Ser: 10.1 mg/dL — ABNORMAL HIGH (ref 0.61–1.24)
Creatinine, Ser: 9.9 mg/dL — ABNORMAL HIGH (ref 0.61–1.24)
GLUCOSE: 153 mg/dL — AB (ref 65–99)
GLUCOSE: 154 mg/dL — AB (ref 65–99)
Glucose, Bld: 120 mg/dL — ABNORMAL HIGH (ref 65–99)
Glucose, Bld: 132 mg/dL — ABNORMAL HIGH (ref 65–99)
Glucose, Bld: 109 mg/dL — ABNORMAL HIGH (ref 65–99)
Glucose, Bld: 125 mg/dL — ABNORMAL HIGH (ref 65–99)
HCT: 29 % — ABNORMAL LOW (ref 39.0–52.0)
HCT: 23 % — ABNORMAL LOW (ref 39.0–52.0)
HEMATOCRIT: 23 % — AB (ref 39.0–52.0)
HEMATOCRIT: 24 % — AB (ref 39.0–52.0)
HEMATOCRIT: 26 % — AB (ref 39.0–52.0)
HEMATOCRIT: 27 % — AB (ref 39.0–52.0)
HEMOGLOBIN: 9.9 g/dL — AB (ref 13.0–17.0)
HEMOGLOBIN: 7.8 g/dL — AB (ref 13.0–17.0)
HEMOGLOBIN: 8.2 g/dL — AB (ref 13.0–17.0)
HEMOGLOBIN: 9.2 g/dL — AB (ref 13.0–17.0)
Hemoglobin: 8.8 g/dL — ABNORMAL LOW (ref 13.0–17.0)
Hemoglobin: 7.8 g/dL — ABNORMAL LOW (ref 13.0–17.0)
POTASSIUM: 5.8 mmol/L — AB (ref 3.5–5.1)
POTASSIUM: 6 mmol/L — AB (ref 3.5–5.1)
POTASSIUM: 4.9 mmol/L (ref 3.5–5.1)
POTASSIUM: 6.4 mmol/L — AB (ref 3.5–5.1)
Potassium: 5.4 mmol/L — ABNORMAL HIGH (ref 3.5–5.1)
Potassium: 5.9 mmol/L — ABNORMAL HIGH (ref 3.5–5.1)
SODIUM: 135 mmol/L (ref 135–145)
SODIUM: 134 mmol/L — AB (ref 135–145)
SODIUM: 137 mmol/L (ref 135–145)
Sodium: 135 mmol/L (ref 135–145)
Sodium: 135 mmol/L (ref 135–145)
Sodium: 138 mmol/L (ref 135–145)
TCO2: 26 mmol/L (ref 0–100)
TCO2: 27 mmol/L (ref 0–100)
TCO2: 27 mmol/L (ref 0–100)
TCO2: 26 mmol/L (ref 0–100)
TCO2: 20 mmol/L (ref 0–100)
TCO2: 19 mmol/L (ref 0–100)

## 2015-07-03 LAB — RENAL FUNCTION PANEL
Albumin: 3.4 g/dL — ABNORMAL LOW (ref 3.5–5.0)
Anion gap: 11 (ref 5–15)
BUN: 56 mg/dL — ABNORMAL HIGH (ref 6–20)
CALCIUM: 9.5 mg/dL (ref 8.9–10.3)
CO2: 27 mmol/L (ref 22–32)
CREATININE: 9.79 mg/dL — AB (ref 0.61–1.24)
Chloride: 96 mmol/L — ABNORMAL LOW (ref 101–111)
GFR calc non Af Amer: 5 mL/min — ABNORMAL LOW (ref >60)
GFR, EST AFRICAN AMERICAN: 5 mL/min — AB (ref >60)
Glucose, Bld: 140 mg/dL — ABNORMAL HIGH (ref 65–99)
Phosphorus: 5.3 mg/dL — ABNORMAL HIGH (ref 2.5–4.6)
Potassium: 5.2 mmol/L — ABNORMAL HIGH (ref 3.5–5.1)
SODIUM: 134 mmol/L — AB (ref 135–145)

## 2015-07-03 LAB — GLUCOSE, CAPILLARY
GLUCOSE-CAPILLARY: 67 mg/dL (ref 65–99)
GLUCOSE-CAPILLARY: 84 mg/dL (ref 65–99)
GLUCOSE-CAPILLARY: 89 mg/dL (ref 65–99)
GLUCOSE-CAPILLARY: 119 mg/dL — AB (ref 65–99)
Glucose-Capillary: 146 mg/dL — ABNORMAL HIGH (ref 65–99)
Glucose-Capillary: 105 mg/dL — ABNORMAL HIGH (ref 65–99)
Glucose-Capillary: 114 mg/dL — ABNORMAL HIGH (ref 65–99)
Glucose-Capillary: 114 mg/dL — ABNORMAL HIGH (ref 65–99)

## 2015-07-03 LAB — POCT I-STAT 4, (NA,K, GLUC, HGB,HCT)
GLUCOSE: 84 mg/dL (ref 65–99)
HCT: 26 % — ABNORMAL LOW (ref 39.0–52.0)
Hemoglobin: 8.8 g/dL — ABNORMAL LOW (ref 13.0–17.0)
POTASSIUM: 4.5 mmol/L (ref 3.5–5.1)
Sodium: 140 mmol/L (ref 135–145)

## 2015-07-03 LAB — PREPARE RBC (CROSSMATCH)

## 2015-07-03 LAB — HEMOGLOBIN AND HEMATOCRIT, BLOOD
HCT: 23.2 % — ABNORMAL LOW (ref 39.0–52.0)
Hemoglobin: 7.9 g/dL — ABNORMAL LOW (ref 13.0–17.0)

## 2015-07-03 LAB — PROTIME-INR
INR: 1.1 (ref 0.00–1.49)
INR: 1.44 (ref 0.00–1.49)
PROTHROMBIN TIME: 17.6 s — AB (ref 11.6–15.2)
Prothrombin Time: 14.4 seconds (ref 11.6–15.2)

## 2015-07-03 LAB — CREATININE, SERUM
Creatinine, Ser: 9.69 mg/dL — ABNORMAL HIGH (ref 0.61–1.24)
GFR calc Af Amer: 5 mL/min — ABNORMAL LOW (ref >60)
GFR calc non Af Amer: 5 mL/min — ABNORMAL LOW (ref >60)

## 2015-07-03 LAB — MAGNESIUM: Magnesium: 1.9 mg/dL (ref 1.7–2.4)

## 2015-07-03 LAB — PLATELET COUNT: Platelets: 79 10*3/uL — ABNORMAL LOW (ref 150–400)

## 2015-07-03 LAB — SURGICAL PCR SCREEN
MRSA, PCR: NEGATIVE
Staphylococcus aureus: POSITIVE — AB

## 2015-07-03 LAB — APTT
aPTT: 32 seconds (ref 24–37)
aPTT: 34 seconds (ref 24–37)

## 2015-07-03 SURGERY — CORONARY ARTERY BYPASS GRAFTING (CABG)
Anesthesia: General | Site: Chest

## 2015-07-03 MED ORDER — SODIUM CHLORIDE 0.9 % IJ SOLN
3.0000 mL | INTRAMUSCULAR | Status: DC | PRN
Start: 2015-07-04 — End: 2015-07-08

## 2015-07-03 MED ORDER — FENTANYL CITRATE (PF) 250 MCG/5ML IJ SOLN
INTRAMUSCULAR | Status: AC
  Filled 2015-07-03: qty 5

## 2015-07-03 MED ORDER — CISATRACURIUM BESYLATE (PF) 10 MG/5ML IV SOLN
INTRAVENOUS | Status: DC | PRN
  Administered 2015-07-03: 2 mg via INTRAVENOUS
  Administered 2015-07-03: 6 mg via INTRAVENOUS
  Administered 2015-07-03: 4 mg via INTRAVENOUS
  Administered 2015-07-03: 2 mg via INTRAVENOUS
  Administered 2015-07-03 (×2): 4 mg via INTRAVENOUS
  Administered 2015-07-03: 6 mg via INTRAVENOUS
  Administered 2015-07-03: 4 mg via INTRAVENOUS
  Administered 2015-07-03: 6 mg via INTRAVENOUS
  Administered 2015-07-03: 2 mg via INTRAVENOUS

## 2015-07-03 MED ORDER — ACETAMINOPHEN 500 MG PO TABS
1000.0000 mg | ORAL_TABLET | Freq: Four times a day (QID) | ORAL | Status: DC
  Administered 2015-07-04 – 2015-07-05 (×3): 1000 mg via ORAL
  Filled 2015-07-03 (×12): qty 2

## 2015-07-03 MED ORDER — FAMOTIDINE IN NACL 20-0.9 MG/50ML-% IV SOLN
20.0000 mg | Freq: Two times a day (BID) | INTRAVENOUS | Status: AC
  Administered 2015-07-03 (×2): 20 mg via INTRAVENOUS
  Filled 2015-07-03: qty 50

## 2015-07-03 MED ORDER — MIDAZOLAM HCL 10 MG/2ML IJ SOLN
INTRAMUSCULAR | Status: AC
  Filled 2015-07-03: qty 4

## 2015-07-03 MED ORDER — DEXTROSE 50 % IV SOLN
13.0000 mL | Freq: Once | INTRAVENOUS | Status: AC
  Administered 2015-07-03: 13 mL via INTRAVENOUS

## 2015-07-03 MED ORDER — INSULIN REGULAR BOLUS VIA INFUSION
0.0000 [IU] | Freq: Three times a day (TID) | INTRAVENOUS | Status: DC
  Filled 2015-07-03: qty 10

## 2015-07-03 MED ORDER — MORPHINE SULFATE (PF) 2 MG/ML IV SOLN: 1.0000 mg | INTRAVENOUS | Status: AC | PRN

## 2015-07-03 MED ORDER — SODIUM CHLORIDE 0.9 % IJ SOLN
3.0000 mL | Freq: Two times a day (BID) | INTRAMUSCULAR | Status: DC
  Administered 2015-07-04 – 2015-07-07 (×3): 3 mL via INTRAVENOUS

## 2015-07-03 MED ORDER — CALCIUM CHLORIDE 10 % IV SOLN
INTRAVENOUS | Status: DC | PRN
  Administered 2015-07-03: 500 mg via INTRAVENOUS
  Administered 2015-07-03 (×2): 250 mg via INTRAVENOUS

## 2015-07-03 MED ORDER — DEXMEDETOMIDINE HCL IN NACL 400 MCG/100ML IV SOLN
0.4000 ug/kg/h | INTRAVENOUS | Status: DC
  Filled 2015-07-03: qty 100

## 2015-07-03 MED ORDER — MAGNESIUM SULFATE 4 GM/100ML IV SOLN: 4.0000 g | Freq: Once | INTRAVENOUS | Status: DC

## 2015-07-03 MED ORDER — 0.9 % SODIUM CHLORIDE (POUR BTL) OPTIME
TOPICAL | Status: DC | PRN
  Administered 2015-07-03: 1000 mL

## 2015-07-03 MED ORDER — ANTISEPTIC ORAL RINSE SOLUTION (CORINZ)
7.0000 mL | Freq: Four times a day (QID) | OROMUCOSAL | Status: DC
  Administered 2015-07-03 – 2015-07-05 (×6): 7 mL via OROMUCOSAL

## 2015-07-03 MED ORDER — ALBUMIN HUMAN 5 % IV SOLN: 250.0000 mL | INTRAVENOUS | Status: AC | PRN

## 2015-07-03 MED ORDER — ACETAMINOPHEN 160 MG/5ML PO SOLN: 1000.0000 mg | Freq: Four times a day (QID) | ORAL | Status: DC

## 2015-07-03 MED ORDER — ARTIFICIAL TEARS OP OINT
TOPICAL_OINTMENT | OPHTHALMIC | Status: DC | PRN
  Administered 2015-07-03: 1 via OPHTHALMIC

## 2015-07-03 MED ORDER — OXYCODONE HCL 5 MG PO TABS
5.0000 mg | ORAL_TABLET | ORAL | Status: DC | PRN
  Administered 2015-07-04: 5 mg via ORAL
  Administered 2015-07-04 – 2015-07-05 (×2): 10 mg via ORAL
  Filled 2015-07-03: qty 2
  Filled 2015-07-03: qty 1
  Filled 2015-07-03: qty 2

## 2015-07-03 MED ORDER — METOPROLOL TARTRATE 12.5 MG HALF TABLET
12.5000 mg | ORAL_TABLET | Freq: Two times a day (BID) | ORAL | Status: DC
  Administered 2015-07-04 – 2015-07-05 (×2): 12.5 mg via ORAL
  Filled 2015-07-03 (×11): qty 1

## 2015-07-03 MED ORDER — ACETAMINOPHEN 160 MG/5ML PO SOLN: 650.0000 mg | Freq: Once | ORAL | Status: AC

## 2015-07-03 MED ORDER — ACETAMINOPHEN 650 MG RE SUPP
650.0000 mg | Freq: Once | RECTAL | Status: AC
  Administered 2015-07-03: 650 mg via RECTAL

## 2015-07-03 MED ORDER — SODIUM CHLORIDE 0.9 % IV SOLN
INTRAVENOUS | Status: DC
  Filled 2015-07-03 (×2): qty 2.5

## 2015-07-03 MED ORDER — DEXMEDETOMIDINE HCL IN NACL 200 MCG/50ML IV SOLN: 0.0000 ug/kg/h | INTRAVENOUS | Status: DC

## 2015-07-03 MED ORDER — CALCIUM CHLORIDE 10 % IV SOLN
INTRAVENOUS | Status: AC
  Filled 2015-07-03: qty 10

## 2015-07-03 MED ORDER — LACTATED RINGERS IV SOLN: INTRAVENOUS | Status: DC

## 2015-07-03 MED ORDER — DOCUSATE SODIUM 100 MG PO CAPS
200.0000 mg | ORAL_CAPSULE | Freq: Every day | ORAL | Status: DC
  Administered 2015-07-05: 200 mg via ORAL
  Filled 2015-07-03: qty 2

## 2015-07-03 MED ORDER — ASPIRIN 81 MG PO CHEW
324.0000 mg | CHEWABLE_TABLET | Freq: Every day | ORAL | Status: DC
  Administered 2015-07-07: 324 mg
  Filled 2015-07-03: qty 4

## 2015-07-03 MED ORDER — ONDANSETRON HCL 4 MG/2ML IJ SOLN
4.0000 mg | Freq: Four times a day (QID) | INTRAMUSCULAR | Status: DC | PRN
  Filled 2015-07-03: qty 2

## 2015-07-03 MED ORDER — PROPOFOL 10 MG/ML IV BOLUS
INTRAVENOUS | Status: AC
  Filled 2015-07-03: qty 20

## 2015-07-03 MED ORDER — SODIUM CHLORIDE 0.45 % IV SOLN: INTRAVENOUS | Status: DC | PRN

## 2015-07-03 MED ORDER — SODIUM CHLORIDE 0.9 % IV SOLN
INTRAVENOUS | Status: DC | PRN
  Administered 2015-07-03: 07:00:00 via INTRAVENOUS

## 2015-07-03 MED ORDER — VANCOMYCIN HCL IN DEXTROSE 1-5 GM/200ML-% IV SOLN: 1000.0000 mg | Freq: Once | INTRAVENOUS | Status: DC

## 2015-07-03 MED ORDER — INSULIN ASPART 100 UNIT/ML IV SOLN
INTRAVENOUS | Status: DC | PRN
  Administered 2015-07-03: 5 [IU] via INTRAVENOUS

## 2015-07-03 MED ORDER — TRAMADOL HCL 50 MG PO TABS
50.0000 mg | ORAL_TABLET | ORAL | Status: DC | PRN
  Filled 2015-07-03: qty 2

## 2015-07-03 MED ORDER — HEPARIN SODIUM (PORCINE) 1000 UNIT/ML IJ SOLN
INTRAMUSCULAR | Status: DC | PRN
  Administered 2015-07-03: 42000 [IU] via INTRAVENOUS

## 2015-07-03 MED ORDER — METOPROLOL TARTRATE 1 MG/ML IV SOLN
2.5000 mg | INTRAVENOUS | Status: DC | PRN
  Administered 2015-07-06: 5 mg via INTRAVENOUS
  Filled 2015-07-03: qty 5

## 2015-07-03 MED ORDER — MUPIROCIN 2 % EX OINT
1.0000 "application " | TOPICAL_OINTMENT | Freq: Two times a day (BID) | CUTANEOUS | Status: AC
  Administered 2015-07-03 – 2015-07-07 (×9): 1 via NASAL
  Filled 2015-07-03 (×2): qty 22

## 2015-07-03 MED ORDER — SODIUM BICARBONATE 8.4 % IV SOLN
INTRAVENOUS | Status: AC
  Filled 2015-07-03: qty 50

## 2015-07-03 MED ORDER — MIDAZOLAM HCL 2 MG/2ML IJ SOLN: 2.0000 mg | INTRAMUSCULAR | Status: DC | PRN

## 2015-07-03 MED ORDER — BISACODYL 5 MG PO TBEC
10.0000 mg | DELAYED_RELEASE_TABLET | Freq: Every day | ORAL | Status: DC
  Filled 2015-07-03: qty 2

## 2015-07-03 MED ORDER — HEMOSTATIC AGENTS (NO CHARGE) OPTIME
TOPICAL | Status: DC | PRN
  Administered 2015-07-03: 1 via TOPICAL

## 2015-07-03 MED ORDER — ASPIRIN EC 325 MG PO TBEC
325.0000 mg | DELAYED_RELEASE_TABLET | Freq: Every day | ORAL | Status: DC
  Administered 2015-07-05: 325 mg via ORAL
  Filled 2015-07-03 (×5): qty 1

## 2015-07-03 MED ORDER — SODIUM CHLORIDE 0.9 % IV SOLN: Freq: Once | INTRAVENOUS | Status: DC

## 2015-07-03 MED ORDER — METOPROLOL TARTRATE 25 MG/10 ML ORAL SUSPENSION
12.5000 mg | Freq: Two times a day (BID) | ORAL | Status: DC
Start: 2015-07-03 — End: 2015-07-08
  Filled 2015-07-03 (×11): qty 5

## 2015-07-03 MED ORDER — MIDAZOLAM HCL 5 MG/5ML IJ SOLN
INTRAMUSCULAR | Status: DC | PRN
  Administered 2015-07-03 (×2): 2 mg via INTRAVENOUS
  Administered 2015-07-03: 6 mg via INTRAVENOUS

## 2015-07-03 MED ORDER — NITROGLYCERIN 0.2 MG/ML ON CALL CATH LAB
INTRAVENOUS | Status: DC | PRN
  Administered 2015-07-03: 20 ug via INTRAVENOUS

## 2015-07-03 MED ORDER — PHENYLEPHRINE HCL 10 MG/ML IJ SOLN
0.0000 ug/min | INTRAVENOUS | Status: DC
  Administered 2015-07-06: 20 ug/min via INTRAVENOUS
  Administered 2015-07-07: 80 ug/min via INTRAVENOUS
  Administered 2015-07-07: 70 ug/min via INTRAVENOUS
  Filled 2015-07-03 (×7): qty 2

## 2015-07-03 MED ORDER — SODIUM CHLORIDE 0.9 % IV SOLN: 250.0000 mL | INTRAVENOUS | Status: DC

## 2015-07-03 MED ORDER — CISATRACURIUM BESYLATE 20 MG/10ML IV SOLN
INTRAVENOUS | Status: AC
  Filled 2015-07-03: qty 10

## 2015-07-03 MED ORDER — LACTATED RINGERS IV SOLN
INTRAVENOUS | Status: DC
Start: 2015-07-03 — End: 2015-07-08

## 2015-07-03 MED ORDER — HEPARIN SODIUM (PORCINE) 1000 UNIT/ML IJ SOLN
INTRAMUSCULAR | Status: AC
  Filled 2015-07-03: qty 2

## 2015-07-03 MED ORDER — PROPOFOL 10 MG/ML IV BOLUS
INTRAVENOUS | Status: DC | PRN
  Administered 2015-07-03: 50 mg via INTRAVENOUS

## 2015-07-03 MED ORDER — SODIUM CHLORIDE 0.9 % IV SOLN
INTRAVENOUS | Status: DC
  Administered 2015-07-06 – 2015-07-08 (×2): via INTRAVENOUS

## 2015-07-03 MED ORDER — PANTOPRAZOLE SODIUM 40 MG PO TBEC
40.0000 mg | DELAYED_RELEASE_TABLET | Freq: Every day | ORAL | Status: DC
Start: 2015-07-05 — End: 2015-07-06
  Administered 2015-07-05: 40 mg via ORAL
  Filled 2015-07-03: qty 1

## 2015-07-03 MED ORDER — LACTATED RINGERS IV SOLN: 500.0000 mL | Freq: Once | INTRAVENOUS | Status: DC | PRN

## 2015-07-03 MED ORDER — ROCURONIUM BROMIDE 50 MG/5ML IV SOLN
INTRAVENOUS | Status: AC
  Filled 2015-07-03: qty 2

## 2015-07-03 MED ORDER — MUPIROCIN 2 % EX OINT: 1.0000 "application " | TOPICAL_OINTMENT | Freq: Two times a day (BID) | CUTANEOUS | Status: DC

## 2015-07-03 MED ORDER — DEXTROSE 50 % IV SOLN
INTRAVENOUS | Status: DC | PRN
  Administered 2015-07-03: .5 via INTRAVENOUS

## 2015-07-03 MED ORDER — CHLORHEXIDINE GLUCONATE 0.12% ORAL RINSE (MEDLINE KIT)
15.0000 mL | Freq: Two times a day (BID) | OROMUCOSAL | Status: DC
  Administered 2015-07-03: 15 mL via OROMUCOSAL

## 2015-07-03 MED ORDER — SUCCINYLCHOLINE CHLORIDE 20 MG/ML IJ SOLN
INTRAMUSCULAR | Status: AC
  Filled 2015-07-03: qty 1

## 2015-07-03 MED ORDER — MORPHINE SULFATE (PF) 2 MG/ML IV SOLN
2.0000 mg | INTRAVENOUS | Status: DC | PRN
  Administered 2015-07-03: 2 mg via INTRAVENOUS
  Administered 2015-07-03: 4 mg via INTRAVENOUS
  Administered 2015-07-04 – 2015-07-07 (×3): 2 mg via INTRAVENOUS
  Administered 2015-07-07 – 2015-07-08 (×2): 4 mg via INTRAVENOUS
  Filled 2015-07-03: qty 1
  Filled 2015-07-03 (×2): qty 2
  Filled 2015-07-03: qty 1
  Filled 2015-07-03: qty 2
  Filled 2015-07-03 (×2): qty 1

## 2015-07-03 MED ORDER — PROTAMINE SULFATE 10 MG/ML IV SOLN
INTRAVENOUS | Status: AC
  Filled 2015-07-03: qty 25

## 2015-07-03 MED ORDER — SODIUM CHLORIDE 0.9 % IJ SOLN
OROMUCOSAL | Status: DC | PRN
  Administered 2015-07-03 (×2): via TOPICAL

## 2015-07-03 MED ORDER — CHLORHEXIDINE GLUCONATE CLOTH 2 % EX PADS: 6.0000 | MEDICATED_PAD | Freq: Every day | CUTANEOUS | Status: DC

## 2015-07-03 MED ORDER — CHLORHEXIDINE GLUCONATE 0.12 % MT SOLN
OROMUCOSAL | Status: AC
  Filled 2015-07-03: qty 15

## 2015-07-03 MED ORDER — DEXTROSE 50 % IV SOLN
INTRAVENOUS | Status: AC
  Filled 2015-07-03: qty 50

## 2015-07-03 MED ORDER — PROTAMINE SULFATE 10 MG/ML IV SOLN
INTRAVENOUS | Status: DC | PRN
  Administered 2015-07-03: 350 mg via INTRAVENOUS

## 2015-07-03 MED ORDER — SODIUM BICARBONATE 8.4 % IV SOLN
25.0000 meq | Freq: Once | INTRAVENOUS | Status: AC
  Administered 2015-07-03: 25 meq via INTRAVENOUS

## 2015-07-03 MED ORDER — FENTANYL CITRATE (PF) 250 MCG/5ML IJ SOLN
INTRAMUSCULAR | Status: DC | PRN
  Administered 2015-07-03: 50 ug via INTRAVENOUS
  Administered 2015-07-03: 100 ug via INTRAVENOUS
  Administered 2015-07-03: 50 ug via INTRAVENOUS
  Administered 2015-07-03: 100 ug via INTRAVENOUS
  Administered 2015-07-03 (×2): 50 ug via INTRAVENOUS
  Administered 2015-07-03: 100 ug via INTRAVENOUS
  Administered 2015-07-03: 150 ug via INTRAVENOUS
  Administered 2015-07-03: 100 ug via INTRAVENOUS
  Administered 2015-07-03: 150 ug via INTRAVENOUS
  Administered 2015-07-03: 200 ug via INTRAVENOUS
  Administered 2015-07-03: 300 ug via INTRAVENOUS
  Administered 2015-07-03 (×2): 100 ug via INTRAVENOUS
  Administered 2015-07-03: 150 ug via INTRAVENOUS

## 2015-07-03 MED ORDER — BISACODYL 10 MG RE SUPP: 10.0000 mg | Freq: Every day | RECTAL | Status: DC

## 2015-07-03 MED ORDER — POTASSIUM CHLORIDE 10 MEQ/50ML IV SOLN: 10.0000 meq | INTRAVENOUS | Status: AC

## 2015-07-03 MED ORDER — ROCURONIUM BROMIDE 100 MG/10ML IV SOLN
INTRAVENOUS | Status: DC | PRN
  Administered 2015-07-03: 50 mg via INTRAVENOUS

## 2015-07-03 MED ORDER — CHLORHEXIDINE GLUCONATE CLOTH 2 % EX PADS
6.0000 | MEDICATED_PAD | Freq: Every day | CUTANEOUS | Status: DC
  Administered 2015-07-03 – 2015-07-06 (×4): 6 via TOPICAL

## 2015-07-03 MED ORDER — DEXTROSE 5 % IV SOLN
750.0000 g | Freq: Once | INTRAVENOUS | Status: AC
  Administered 2015-07-04: 0.75 g via INTRAVENOUS
  Filled 2015-07-03: qty 750

## 2015-07-03 MED ORDER — NITROGLYCERIN IN D5W 200-5 MCG/ML-% IV SOLN: 0.0000 ug/min | INTRAVENOUS | Status: DC

## 2015-07-03 MED ORDER — PROTAMINE SULFATE 10 MG/ML IV SOLN
INTRAVENOUS | Status: AC
  Filled 2015-07-03: qty 10

## 2015-07-03 MED FILL — Heparin Sodium (Porcine) Inj 1000 Unit/ML: INTRAMUSCULAR | Qty: 10 | Status: AC

## 2015-07-03 MED FILL — Sodium Chloride IV Soln 0.9%: INTRAVENOUS | Qty: 8000 | Status: AC

## 2015-07-03 MED FILL — Magnesium Sulfate Inj 50%: INTRAMUSCULAR | Qty: 10 | Status: AC

## 2015-07-03 MED FILL — Potassium Chloride Inj 2 mEq/ML: INTRAVENOUS | Qty: 40 | Status: AC

## 2015-07-03 MED FILL — Electrolyte-R (PH 7.4) Solution: INTRAVENOUS | Qty: 3000 | Status: AC

## 2015-07-03 MED FILL — Lidocaine HCl IV Inj 20 MG/ML: INTRAVENOUS | Qty: 5 | Status: AC

## 2015-07-03 MED FILL — Mannitol IV Soln 20%: INTRAVENOUS | Qty: 500 | Status: AC

## 2015-07-03 MED FILL — Sodium Bicarbonate IV Soln 8.4%: INTRAVENOUS | Qty: 50 | Status: AC

## 2015-07-03 MED FILL — Heparin Sodium (Porcine) Inj 1000 Unit/ML: INTRAMUSCULAR | Qty: 30 | Status: AC

## 2015-07-03 SURGICAL SUPPLY — 63 items
BAG DECANTER FOR FLEXI CONT (MISCELLANEOUS) ×3 IMPLANT
BANDAGE ELASTIC 4 VELCRO ST LF (GAUZE/BANDAGES/DRESSINGS) ×3 IMPLANT
BANDAGE ELASTIC 6 VELCRO ST LF (GAUZE/BANDAGES/DRESSINGS) ×3 IMPLANT
BLADE STERNUM SYSTEM 6 (BLADE) ×3 IMPLANT
BLADE SURG 11 STRL SS (BLADE) ×3 IMPLANT
BLOOD HAEMOCONCENTR 700 MIDI (MISCELLANEOUS) ×3 IMPLANT
BNDG GAUZE ELAST 4 BULKY (GAUZE/BANDAGES/DRESSINGS) ×3 IMPLANT
CANISTER SUCTION 2500CC (MISCELLANEOUS) ×3 IMPLANT
CATH CPB KIT GERHARDT (MISCELLANEOUS) ×3 IMPLANT
CATH THORACIC 28FR (CATHETERS) ×3 IMPLANT
CRADLE DONUT ADULT HEAD (MISCELLANEOUS) ×3 IMPLANT
DRAIN CHANNEL 28F RND 3/8 FF (WOUND CARE) ×3 IMPLANT
DRAPE CARDIOVASCULAR INCISE (DRAPES) ×1
DRAPE SLUSH/WARMER DISC (DRAPES) ×3 IMPLANT
DRAPE SRG 135X102X78XABS (DRAPES) ×2 IMPLANT
DRSG AQUACEL AG ADV 3.5X14 (GAUZE/BANDAGES/DRESSINGS) ×3 IMPLANT
DRSG COVADERM 4X14 (GAUZE/BANDAGES/DRESSINGS) ×3 IMPLANT
ELECT BLADE 4.0 EZ CLEAN MEGAD (MISCELLANEOUS) ×3
ELECT REM PT RETURN 9FT ADLT (ELECTROSURGICAL) ×6
ELECTRODE BLDE 4.0 EZ CLN MEGD (MISCELLANEOUS) ×2 IMPLANT
ELECTRODE REM PT RTRN 9FT ADLT (ELECTROSURGICAL) ×4 IMPLANT
GAUZE SPONGE 4X4 12PLY STRL (GAUZE/BANDAGES/DRESSINGS) ×6 IMPLANT
GLOVE BIO SURGEON STRL SZ 6.5 (GLOVE) ×9 IMPLANT
GOWN STRL REUS W/ TWL LRG LVL3 (GOWN DISPOSABLE) ×8 IMPLANT
GOWN STRL REUS W/TWL LRG LVL3 (GOWN DISPOSABLE) ×4
HEMOSTAT POWDER SURGIFOAM 1G (HEMOSTASIS) ×9 IMPLANT
HEMOSTAT SURGICEL 2X14 (HEMOSTASIS) ×3 IMPLANT
KIT BASIN OR (CUSTOM PROCEDURE TRAY) ×3 IMPLANT
KIT CATH SUCT 8FR (CATHETERS) ×3 IMPLANT
KIT ROOM TURNOVER OR (KITS) ×3 IMPLANT
KIT SUCTION CATH 14FR (SUCTIONS) ×6 IMPLANT
KIT VASOVIEW W/TROCAR VH 2000 (KITS) ×3 IMPLANT
LEAD PACING MYOCARDI (MISCELLANEOUS) ×3 IMPLANT
LIQUID BAND (GAUZE/BANDAGES/DRESSINGS) IMPLANT
MARKER GRAFT CORONARY BYPASS (MISCELLANEOUS) ×9 IMPLANT
NS IRRIG 1000ML POUR BTL (IV SOLUTION) ×15 IMPLANT
PACK OPEN HEART (CUSTOM PROCEDURE TRAY) ×3 IMPLANT
PAD ARMBOARD 7.5X6 YLW CONV (MISCELLANEOUS) ×6 IMPLANT
PAD ELECT DEFIB RADIOL ZOLL (MISCELLANEOUS) ×3 IMPLANT
PENCIL BUTTON HOLSTER BLD 10FT (ELECTRODE) ×3 IMPLANT
SET CARDIOPLEGIA MPS 5001102 (MISCELLANEOUS) ×3 IMPLANT
SPONGE GAUZE 4X4 12PLY STER LF (GAUZE/BANDAGES/DRESSINGS) ×6 IMPLANT
SUT BONE WAX W31G (SUTURE) ×3 IMPLANT
SUT MNCRL AB 4-0 PS2 18 (SUTURE) ×3 IMPLANT
SUT PROLENE 3 0 SH1 36 (SUTURE) ×3 IMPLANT
SUT PROLENE 4 0 TF (SUTURE) ×6 IMPLANT
SUT PROLENE 5 0 C 1 36 (SUTURE) ×3 IMPLANT
SUT PROLENE 6 0 CC (SUTURE) ×6 IMPLANT
SUT PROLENE 7 0 BV1 MDA (SUTURE) ×3 IMPLANT
SUT PROLENE 8 0 BV175 6 (SUTURE) ×9 IMPLANT
SUT STEEL 6MS V (SUTURE) ×3 IMPLANT
SUT STEEL SZ 6 DBL 3X14 BALL (SUTURE) ×3 IMPLANT
SUT VIC AB 1 CTX 18 (SUTURE) ×6 IMPLANT
SUT VIC AB 3-0 SH 27 (SUTURE) ×1
SUT VIC AB 3-0 SH 27X BRD (SUTURE) ×2 IMPLANT
SUTURE E-PAK OPEN HEART (SUTURE) ×3 IMPLANT
SYSTEM SAHARA CHEST DRAIN ATS (WOUND CARE) ×3 IMPLANT
TOWEL OR 17X24 6PK STRL BLUE (TOWEL DISPOSABLE) ×6 IMPLANT
TOWEL OR 17X26 10 PK STRL BLUE (TOWEL DISPOSABLE) ×6 IMPLANT
TRAY FOLEY IC TEMP SENS 16FR (CATHETERS) ×3 IMPLANT
TUBING INSUFFLATION (TUBING) ×3 IMPLANT
UNDERPAD 30X30 INCONTINENT (UNDERPADS AND DIAPERS) ×3 IMPLANT
WATER STERILE IRR 1000ML POUR (IV SOLUTION) ×6 IMPLANT

## 2015-07-03 NOTE — Progress Notes (Signed)
PS/CPAP at this time. Pt is stable. RN aware

## 2015-07-03 NOTE — Progress Notes (Signed)
[facility] Progress Note   Subjective:    S/p 2V CABG (LIMA to LAD, SVG to distal cx) Still intubated On only small dose neo Last K 4.5 (was as high as 6 at one point in the OR - and of note pt signed off dialysis AMA after only 2 hours yesterday) Due for repeat labs around 7PM   Physical Exam BP 106/59 mmHg  Pulse 91  Temp(Src) 98.1 F (36.7 C) (Oral)  Resp 20  Ht 5\' 11"  (1.803 m)  Wt 115.214 kg (254 lb)  BMI 35.44 kg/m2  SpO2 100%  General: Intubated, somewhat agitated and waking up some ETT, chest tubes in place bloody drainage Heart: S1,S2, Regular. Paced. Median sternotomy incision with dressing in place Pacemaker in place left side Lungs: Ant clear Abdomen: Protuberant abdomen. Active BS. Extremities: 1+ woody LE edema LE's Dialysis Access: RUA AVF patent   Additional Objective  Recent Labs Lab 06/29/15 0506 07/02/15 0940 06/16/2015 0550  06/17/2015 1027 06/28/2015 1127 06/15/2015 1219 06/22/2015 1350  NA 141 138 134*  < > 135 135 138 140  K 3.9 5.0 5.2*  < > 5.8* 6.0* 4.9 4.5  CL 98* 99* 96*  < > 99* 103 106  --   CO2 30 26 27   --   --   --   --   --   GLUCOSE 158* 138* 140*  < > 120* 109* 154* 84  BUN 34* 68* 56*  < > 51* 49* 50*  --   CREATININE 8.13* 12.15* 9.79*  < > 9.30* 8.70* 8.50*  --   CALCIUM 10.0 9.5 9.5  --   --   --   --   --   PHOS  --   --  5.3*  --   --   --   --   --   < > = values in this interval not displayed.  Recent Labs Lab 06/29/15 0506 07/02/15 0940 06/09/2015 0550  07/06/2015 1115 06/13/2015 1127 06/26/2015 1219 07/04/2015 1350  WBC 5.2 5.9 4.9  --   --   --   --  8.8  HGB 9.0* 9.2* 9.7*  < > 7.9* 8.8* 7.8* 8.6*  8.8*  HCT 27.8* 28.3* 29.8*  < > 23.2* 26.0* 23.0* 25.8*  26.0*  MCV 98.6 95.6 95.8  --   --   --   --  95.2  PLT 87* 76* 64*  --  79*  --   --  81*  < > = values in this interval not displayed.   Recent Labs Lab 07/02/15 0722 07/02/15 1230 07/02/15 1619 07/02/15 2228 06/22/2015 0540  GLUCAP 116*  121* 148* 115* 146*   Medications: . sodium chloride    . [START ON 07/04/2015] sodium chloride    . sodium chloride 20 mL/hr at 06/26/2015 1500  . dexmedetomidine 0.3 mcg/kg/hr (06/29/2015 1500)  . insulin (NOVOLIN-R) infusion 0.5 Units/hr (06/24/2015 1345)  . lactated ringers    . lactated ringers    . nitroGLYCERIN Stopped (07/01/2015 1340)  . phenylephrine (NEO-SYNEPHRINE) Adult infusion 20 mcg/min (06/18/2015 1500)   . [START ON 07/04/2015] acetaminophen  1,000 mg Oral 4 times per day   Or  . [START ON 07/04/2015] acetaminophen (TYLENOL) oral liquid 160 mg/5 mL  1,000 mg Per Tube 4 times per day  . allopurinol  150 mg Oral Daily  . antiseptic oral rinse  7 mL Mouth Rinse BID  . antiseptic oral rinse  7 mL Mouth Rinse QID  . [  START ON 07/04/2015] aspirin EC  325 mg Oral Daily   Or  . [START ON 07/04/2015] aspirin  324 mg Per Tube Daily  . atorvastatin  80 mg Oral q1800  . [START ON 07/04/2015] bisacodyl  10 mg Oral Daily   Or  . [START ON 07/04/2015] bisacodyl  10 mg Rectal Daily  . [START ON 07/04/2015] cefUROXime (ZINACEF)  IV  750 g Intravenous Once  . chlorhexidine gluconate  15 mL Mouth Rinse BID  . Chlorhexidine Gluconate Cloth  6 each Topical Daily  . cinacalcet  30 mg Oral Q supper  . darbepoetin (ARANESP) injection - DIALYSIS  60 mcg Intravenous Q Mon-HD  . [START ON 07/04/2015] docusate sodium  200 mg Oral Daily  . doxercalciferol  5 mcg Intravenous Q M,W,F-HD  . famotidine (PEPCID) IV  20 mg Intravenous Q12H  . insulin regular  0-10 Units Intravenous TID WC  . loratadine  10 mg Oral Daily  . magnesium sulfate  4 g Intravenous Once  . metoprolol tartrate  12.5 mg Oral BID   Or  . metoprolol tartrate  12.5 mg Per Tube BID  . multivitamin  1 tablet Oral QHS  . mupirocin ointment  1 application Nasal BID  . [START ON 07/05/2015] pantoprazole  40 mg Oral Daily  . potassium chloride  10 mEq Intravenous Q1 Hr x 3  . [START ON 07/04/2015] sodium chloride  3 mL Intravenous  Q12H  . sucroferric oxyhydroxide  1,000 mg Oral TID WC   Dialysis Orders: Center: [facility] on MWF . EDW 121.5 kg HD Bath 2.0 K 2.0 Ca Time 4 hours 15 minutes Heparin 4000 Units per tx. Access RUA AVF BFR 500 DFR 800 manual  Mircera 50 mcg IV q 2 weeks (last dose 06/20/2015) Hectoral 5 mcg IV q MWF  Assessment: 1. CAD. Cath 80% calcified left main stenosis, 95% ostial cx. S/P 2 vessel CABG LIMA to LAD, SVG to distal cx. Hemodynamically stable. Waking up some. Per TCTS 2. 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 ESRD - MWF. Dry weight 121.5kg. Had only 2 hours HD yesterday - early s/o. K all right last check 4.8 and will have repeat this PM. HD tomorrow unless K issues arise tonight. 4. Anemia - Started darbe 60/week on 10/24. (Rec'd last outpt dose of Mircera 06/20/15) .  5. Metabolic bone disease - Resume sensipar and binders when taking po. Hectorol with HD. 6. Nutrition - Carb mod/renal diet when taking po 7. DM: Per primary. 8. HLD - on atorvastatin  Jamal Maes, MD [place] Pager 06/17/2015, 3:52 PM

## 2015-07-03 NOTE — Anesthesia Postprocedure Evaluation (Signed)
Anesthesia Post Note  Patient: Antonio Page  Procedure(s) Performed: Procedure(s) (LRB): CORONARY ARTERY BYPASS GRAFTING (CABG) times two using left internal mammary artery and right saphenous leg vein.  [place] to LAD and SVG to distal circumflex. (N/A) TRANSESOPHAGEAL ECHOCARDIOGRAM (TEE) (N/A)  Anesthesia type: General  Patient location: SICU  Post pain: Pain level controlled  Post assessment: Post-op Vital signs reviewed  Last Vitals: BP 88/45 mmHg  Pulse 90  Temp(Src) 36.7 C (Core (Comment))  Resp 16  Ht 5\' 11"  (1.803 m)  Wt 254 lb (115.214 kg)  BMI 35.44 kg/m2  SpO2 100%  Post vital signs: Reviewed  Level of consciousness: sedated/intubated  Complications: No apparent anesthesia complications

## 2015-07-03 NOTE — Progress Notes (Signed)
Pulmonary Mechanics. NIF -22, VC 1.1L. Pt is stable at this time.

## 2015-07-03 NOTE — Anesthesia Procedure Notes (Addendum)
Anesthesia Procedure Note PA catheter:  Routine monitors. Timeout, sterile prep, drape, FBP R neck.  Trendelenburg position.  1% [place] local, finder and trocar RIJ 1st pass with US guidance.  2-lumen Cordis placed over J wire. PA catheter in easily.  Sterile dressing applied.  Patient tolerated well, VSS.  Jenita Seashore, MD [phone] Procedure Name: Intubation Date/Time: 06/18/2015 7:46 AM Performed by: Rogers Blocker Pre-anesthesia Checklist: Patient identified, Timeout performed, Emergency Drugs available, Suction available and Patient being monitored Patient Re-evaluated:Patient Re-evaluated prior to inductionOxygen Delivery Method: Circle system utilized Preoxygenation: Pre-oxygenation with 100% oxygen Intubation Type: IV induction Ventilation: Oral airway inserted - appropriate to patient size and Two handed mask ventilation required Laryngoscope Size: Mac and 4 Grade View: Grade I Tube type: Oral Tube size: 8.0 mm Number of attempts: 1 Airway Equipment and Method: Stylet Placement Confirmation: ETT inserted through vocal cords under direct vision,  breath sounds checked- equal and bilateral,  positive ETCO2 and CO2 detector Secured at: 22 cm Tube secured with: Tape Dental Injury: Teeth and Oropharynx as per pre-operative assessment

## 2015-07-03 NOTE — Transfer of Care (Signed)
Immediate Anesthesia Transfer of Care Note  Patient: Antonio Page  Procedure(s) Performed: Procedure(s): CORONARY ARTERY BYPASS GRAFTING (CABG) times two using left internal mammary artery and right saphenous leg vein.  [place] to LAD and SVG to distal circumflex. (N/A) TRANSESOPHAGEAL ECHOCARDIOGRAM (TEE) (N/A)  Patient Location: SICU  Anesthesia Type:General  Level of Consciousness: sedated, unresponsive and Patient remains intubated per anesthesia plan  Airway & Oxygen Therapy: Patient remains intubated per anesthesia plan and Patient placed on Ventilator (see vital sign flow sheet for setting)  Post-op Assessment: Report given to RN and Post -op Vital signs reviewed and stable  Post vital signs: Reviewed and stable  Last Vitals:  Filed Vitals:   06/18/2015 1343  BP: 80/47  Pulse: 80  Temp:   Resp: 12    Complications: No apparent anesthesia complications

## 2015-07-03 NOTE — Progress Notes (Signed)
2nd attempt to wean starting now. Pt is stable at this time.

## 2015-07-03 NOTE — Progress Notes (Signed)
  Echocardiogram Echocardiogram Transesophageal has been performed.  Antonio Page M 06/11/2015, 8:04 AM

## 2015-07-03 NOTE — Progress Notes (Signed)
Pt's surgical pcr screen was positive for staph aureus. The protocol for positive staph aureus has been initiated at this time.

## 2015-07-03 NOTE — Progress Notes (Signed)
Attempted Rapid wean protocol on patient.  ABG obtained resulted in a pH of 7.27.  NIF performed with result of -16. VC performed with result of 400.  Patient flipped back to full support and will attempt to wean patient again later.  MD aware.  Will continue to monitor.

## 2015-07-03 NOTE — Procedures (Signed)
Extubation Procedure Note  Patient Details:   Name: Antonio Page DOB: 01/08/1941 MRN: [id number]   Airway Documentation:     Evaluation  O2 sats: stable throughout and currently acceptable Complications: No apparent complications Patient did tolerate procedure well. Bilateral Breath Sounds: Clear   Yes Pt passed all weaning parameters. Prior to extubation, procedure was explained to pt. Pt understood and followed commands. Pt had a cuff leak prior to extubation. Pt tolerated procedure well. No complications noted. Pt stable throughout. Pt was placed on 5L nasal cannula o2 humidity provided post extubation. Pt was able to state his name. Pt is stable at this time. RN at bedside during extubation.  Leigh Aurora 06/27/2015, 11:54 PM

## 2015-07-03 NOTE — Brief Op Note (Signed)
06/14/2015 - 06/30/2015  11:32 AM  PATIENT:  Antonio Page  74 y.o. male  PRE-OPERATIVE DIAGNOSIS:  CAD (LEFT MAIN DISEASE included)   POST-OPERATIVE DIAGNOSIS:  CAD (LEFT MAIN DISEASE included)   PROCEDURE: TRANSESOPHAGEAL ECHOCARDIOGRAM (TEE), MEDIAN STERNOTOMY for CORONARY ARTERY BYPASS GRAFTING (CABG) x 2 (LIMA to LAD and SVG to DISTAL CIRCUMFLEX)  SURGEON:  Surgeon(s) and Role:    * Grace Isaac, MD - Primary  PHYSICIAN ASSISTANT: Lars Pinks PA-C  ANESTHESIA:   general  EBL:  Total I/O In: 500 [I.V.:500] Out: 10 [Urine:10]  DRAINS: Chest tubes placed in the mediastinal and pleural spaces   COUNTS CORRECT:  YES  DICTATION: .Dragon Dictation  PLAN OF CARE: Admit to inpatient   PATIENT DISPOSITION:  ICU - intubated and hemodynamically stable.   Delay start of Pharmacological VTE agent (>24hrs) due to surgical blood loss or risk of bleeding: yes  BASELINE WEIGHT: 115 kg

## 2015-07-04 ENCOUNTER — Encounter (HOSPITAL_COMMUNITY): Payer: Self-pay | Admitting: Cardiothoracic Surgery

## 2015-07-04 ENCOUNTER — Inpatient Hospital Stay (HOSPITAL_COMMUNITY): Payer: Medicare Other

## 2015-07-04 LAB — BASIC METABOLIC PANEL
ANION GAP: 13 (ref 5–15)
BUN: 63 mg/dL — ABNORMAL HIGH (ref 6–20)
CHLORIDE: 102 mmol/L (ref 101–111)
CO2: 19 mmol/L — ABNORMAL LOW (ref 22–32)
Calcium: 9 mg/dL (ref 8.9–10.3)
Creatinine, Ser: 10.35 mg/dL — ABNORMAL HIGH (ref 0.61–1.24)
GFR calc Af Amer: 5 mL/min — ABNORMAL LOW (ref >60)
GFR, EST NON AFRICAN AMERICAN: 4 mL/min — AB (ref >60)
Glucose, Bld: 191 mg/dL — ABNORMAL HIGH (ref 65–99)
POTASSIUM: 6.7 mmol/L — AB (ref 3.5–5.1)
SODIUM: 134 mmol/L — AB (ref 135–145)

## 2015-07-04 LAB — CBC
HCT: 27.8 % — ABNORMAL LOW (ref 39.0–52.0)
HCT: 26.6 % — ABNORMAL LOW (ref 39.0–52.0)
HEMOGLOBIN: 8.9 g/dL — AB (ref 13.0–17.0)
Hemoglobin: 9.1 g/dL — ABNORMAL LOW (ref 13.0–17.0)
MCH: 31.6 pg (ref 26.0–34.0)
MCH: 33.1 pg (ref 26.0–34.0)
MCHC: 32 g/dL (ref 30.0–36.0)
MCHC: 34.2 g/dL (ref 30.0–36.0)
MCV: 98.6 fL (ref 78.0–100.0)
MCV: 96.7 fL (ref 78.0–100.0)
PLATELETS: 111 10*3/uL — AB (ref 150–400)
PLATELETS: 94 10*3/uL — AB (ref 150–400)
RBC: 2.82 MIL/uL — AB (ref 4.22–5.81)
RBC: 2.75 MIL/uL — AB (ref 4.22–5.81)
RDW: 15.6 % — ABNORMAL HIGH (ref 11.5–15.5)
RDW: 15.7 % — ABNORMAL HIGH (ref 11.5–15.5)
WBC: 16.4 10*3/uL — AB (ref 4.0–10.5)
WBC: 14.4 10*3/uL — AB (ref 4.0–10.5)

## 2015-07-04 LAB — RENAL FUNCTION PANEL
ALBUMIN: 2.7 g/dL — AB (ref 3.5–5.0)
Anion gap: 13 (ref 5–15)
BUN: 43 mg/dL — AB (ref 6–20)
CHLORIDE: 103 mmol/L (ref 101–111)
CO2: 23 mmol/L (ref 22–32)
Calcium: 9.4 mg/dL (ref 8.9–10.3)
Creatinine, Ser: 7.42 mg/dL — ABNORMAL HIGH (ref 0.61–1.24)
GFR calc Af Amer: 7 mL/min — ABNORMAL LOW (ref >60)
GFR calc non Af Amer: 6 mL/min — ABNORMAL LOW (ref >60)
GLUCOSE: 148 mg/dL — AB (ref 65–99)
PHOSPHORUS: 5.8 mg/dL — AB (ref 2.5–4.6)
POTASSIUM: 5.4 mmol/L — AB (ref 3.5–5.1)
Sodium: 139 mmol/L (ref 135–145)

## 2015-07-04 LAB — POCT I-STAT, CHEM 8
BUN: 40 mg/dL — ABNORMAL HIGH (ref 6–20)
CALCIUM ION: 1.31 mmol/L — AB (ref 1.13–1.30)
CHLORIDE: 101 mmol/L (ref 101–111)
CREATININE: 7.7 mg/dL — AB (ref 0.61–1.24)
Glucose, Bld: 147 mg/dL — ABNORMAL HIGH (ref 65–99)
HCT: 28 % — ABNORMAL LOW (ref 39.0–52.0)
Hemoglobin: 9.5 g/dL — ABNORMAL LOW (ref 13.0–17.0)
POTASSIUM: 5.2 mmol/L — AB (ref 3.5–5.1)
Sodium: 137 mmol/L (ref 135–145)
TCO2: 24 mmol/L (ref 0–100)

## 2015-07-04 LAB — PREPARE PLATELET PHERESIS: Unit division: 0

## 2015-07-04 LAB — GLUCOSE, CAPILLARY
GLUCOSE-CAPILLARY: 138 mg/dL — AB (ref 65–99)
GLUCOSE-CAPILLARY: 188 mg/dL — AB (ref 65–99)
GLUCOSE-CAPILLARY: 182 mg/dL — AB (ref 65–99)
GLUCOSE-CAPILLARY: 188 mg/dL — AB (ref 65–99)
GLUCOSE-CAPILLARY: 131 mg/dL — AB (ref 65–99)
GLUCOSE-CAPILLARY: 123 mg/dL — AB (ref 65–99)
GLUCOSE-CAPILLARY: 157 mg/dL — AB (ref 65–99)
Glucose-Capillary: 174 mg/dL — ABNORMAL HIGH (ref 65–99)
Glucose-Capillary: 172 mg/dL — ABNORMAL HIGH (ref 65–99)
Glucose-Capillary: 130 mg/dL — ABNORMAL HIGH (ref 65–99)
Glucose-Capillary: 141 mg/dL — ABNORMAL HIGH (ref 65–99)

## 2015-07-04 LAB — CREATININE, SERUM
CREATININE: 7.77 mg/dL — AB (ref 0.61–1.24)
GFR calc non Af Amer: 6 mL/min — ABNORMAL LOW (ref >60)
GFR, EST AFRICAN AMERICAN: 7 mL/min — AB (ref >60)

## 2015-07-04 LAB — MAGNESIUM
MAGNESIUM: 2.1 mg/dL (ref 1.7–2.4)
Magnesium: 1.9 mg/dL (ref 1.7–2.4)

## 2015-07-04 LAB — BLOOD PRODUCT ORDER (VERBAL) VERIFICATION

## 2015-07-04 LAB — HEMOGLOBIN A1C
Hgb A1c MFr Bld: 6.6 % — ABNORMAL HIGH (ref 4.8–5.6)
Mean Plasma Glucose: 143 mg/dL

## 2015-07-04 MED ORDER — DOXERCALCIFEROL 4 MCG/2ML IV SOLN
INTRAVENOUS | Status: AC
  Filled 2015-07-04: qty 4

## 2015-07-04 MED ORDER — SODIUM POLYSTYRENE SULFONATE 15 GM/60ML PO SUSP
30.0000 g | Freq: Once | ORAL | Status: AC
  Administered 2015-07-04: 30 g via RECTAL
  Filled 2015-07-04: qty 120

## 2015-07-04 MED ORDER — KETOROLAC TROMETHAMINE 15 MG/ML IJ SOLN: 15.0000 mg | Freq: Once | INTRAMUSCULAR | Status: DC

## 2015-07-04 MED ORDER — INSULIN ASPART 100 UNIT/ML ~~LOC~~ SOLN
0.0000 [IU] | SUBCUTANEOUS | Status: DC
  Administered 2015-07-04 – 2015-07-05 (×5): 2 [IU] via SUBCUTANEOUS
  Administered 2015-07-05 (×2): 4 [IU] via SUBCUTANEOUS
  Administered 2015-07-05 – 2015-07-06 (×4): 2 [IU] via SUBCUTANEOUS

## 2015-07-04 NOTE — Progress Notes (Addendum)
[address]       [place] [address]             [phone]        1 Day Post-Op Procedure(s) (LRB): CORONARY ARTERY BYPASS GRAFTING (CABG) times two using left internal mammary artery and right saphenous leg vein.  [place] to LAD and SVG to distal circumflex. (N/A) TRANSESOPHAGEAL ECHOCARDIOGRAM (TEE) (N/A)  Subjective: Patient undergoing HD this am. He is not very talkative.  Objective: Vital signs in last 24 hours: Temp:  [97.7 F (36.5 C)-99.5 F (37.5 C)] 98.2 F (36.8 C) (10/26 1118) Pulse Rate:  [59-91] 77 (10/26 1118) Cardiac Rhythm:  [-] A-V Sequential paced (10/26 0945) Resp:  [0-21] 14 (10/26 1118) BP: (80-155)/(34-60) 98/42 mmHg (10/26 1115) SpO2:  [79 %-100 %] 100 % (10/26 1118) Arterial Line BP: (79-189)/(32-92) 116/45 mmHg (10/26 1115) FiO2 (%):  [40 %-50 %] 40 % (10/25 2231) Weight:  [285 lb 7.9 oz (129.5 kg)-288 lb 12.8 oz (131 kg)] 288 lb 12.8 oz (131 kg) (10/26 0734)  Pre op weight 115 kg Current Weight  07/04/15 288 lb 12.8 oz (131 kg)    Hemodynamic parameters for last 24 hours: PAP: (30-48)/(8-22) 32/12 mmHg CO:  [4.6 L/min-9.6 L/min] 9.6 L/min CI:  [2 L/min/m2-4.1 L/min/m2] 4.1 L/min/m2  Intake/Output from previous day: 10/25 0701 - 10/26 0700 In: 2564.2 [P.O.:30; I.V.:1791.2; Blood:603; NG/GT:90; IV Piggyback:50] Out: 1937 [Urine:120; Emesis/NG output:50; Blood:1100; Chest Tube:250]   Physical Exam:  Cardiovascular: Paced Pulmonary: Diminished at bases L>R; no rales, wheezes, or rhonchi. Abdomen: Soft, non tender, bowel sounds present. Extremities: Mild RLE;scd on LLE Wounds: Dressings intacts  Lab Results: CBC: Recent Labs  07/07/2015 2015 06/20/2015 2018 07/04/15 0504  WBC 15.8*  --  16.4*  HGB 9.4* 9.2* 8.9*  HCT 28.9* 27.0* 27.8*  PLT 113*  --  111*   BMET:  Recent Labs  06/28/2015 0550  06/09/2015 2018 07/04/15 0504  NA 134*  < > 137 134*  K 5.2*  < > 6.4* 6.7*  CL 96*  < > 106 102  CO2 27  --   --   19*  GLUCOSE 140*  < > 125* 191*  BUN 56*  < > 54* 63*  CREATININE 9.79*  < > 9.90* 10.35*  CALCIUM 9.5  --   --  9.0  < > = values in this interval not displayed.  PT/INR:  Lab Results  Component Value Date   INR 1.44 06/24/2015   INR 1.10 06/16/2015   INR 0.94 06/26/2015   ABG:  INR: Will add last result for INR, ABG once components are confirmed Will add last 4 CBG results once components are confirmed  Assessment/Plan:  1. CV - Paced (has PPM). Last CO 9.6 and CI 4.1. On Lopressor 12.5 mg bid. 2.  Pulmonary - Chest tubes with 250 cc last 24 hours. 90 cc hours since 7 am. On 3 liters of oxygen via [place]. CXR this am shows cardiomegaly, no pneumothorax, left pleural effusion and atelectasis, and right lung mostly clear. Encourage incentive spirometer 3. Hyperkalemia-K is 6.7 this am. On HD now. 4.  Acute blood loss anemia - H and H 8.9 and 27.8. On Aranesp 5. ESRD-creatiine up to 10.35.HD days are Monday/Wedensday/Friday. Per nephrology 6. Mild thrombocytopenia-platelets stable at 111,000 7. DM-CBGs 188/172/131. On Insulin. 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 Likely remove sleeve and mediastinal chest tube later today  ZIMMERMAN,DONIELLE MPA-C 07/04/2015,11:32 AM  Patient had significant pre op anemia and thrombocytopenia , plts improved this am Expected Acute  Blood - loss Anemia Follows commands but somnolent I have seen and examined Barbara E Baca and agree with the above assessment  and plan.  Grace Isaac MD [place] [phone] Office [phone] 07/04/2015 12:11 PM

## 2015-07-04 NOTE — Progress Notes (Signed)
Pt found with arterial line removed and in hand as well as O2 tubing.  Dressing applied to site and oxygen reapplied. Will address Art Line need during evening rounds.  Pt then attempted to remove central line. Mittens applied and safety sitter requested.  Pt easily reoriented to location but wakes with confusion as previously through out day. Will continue to monitor pt closely.

## 2015-07-04 NOTE — Progress Notes (Signed)
Utilization Review Completed.  

## 2015-07-04 NOTE — Progress Notes (Signed)
      [address]       ,[place] [address]             [phone]      Pulled a line out  BP 136/43 mmHg  Pulse 75  Temp(Src) 98.5 F (36.9 C) (Oral)  Resp 15  Ht 5\' 11"  (1.803 m)  Wt 283 lb 4.7 oz (128.5 kg)  BMI 39.53 kg/m2  SpO2 99%   Intake/Output Summary (Last 24 hours) at 07/04/15 1817 Last data filed at 07/04/15 1724  Gross per 24 hour  Intake 1333.6 ml  Output   3075 ml  Net -1741.4 ml    - 2.5 L off with HD  Still some confusion  Remo Lipps C. Roxan Hockey, MD [facility] [phone]

## 2015-07-04 NOTE — Progress Notes (Signed)
Pt's K  this morning 6.7.  Pt due to have HD today.  Called renal floor and hemo RN coming to due HD now.

## 2015-07-04 NOTE — Progress Notes (Addendum)
CT surgery p.m. Rounds  Patient extubated breathing comfortably AV paced with stable blood pressure P.m. labs reviewed and are satisfactory except for potassium 6.4. We'll give dose of potassium Binder and repeat potassium in a.m. Hemodialysis planned for tomorrow

## 2015-07-04 NOTE — Procedures (Signed)
I have personally attended this patient's dialysis session.   R AVF issues with high venous pressures. As result BFR only 225 K 6.7 Will use 1K bath for 30 minutes then back to 2K since BFR not optimal (if we have similar issues next treatment will need f'gram) Pre HD bed weight 129.5 with EDW 121.5 - will slowly move back toward EDW - goal of 2.5 kg today (sats good on nasal cannula, no edema on CXR - just left effusion)  Jamal Maes, MD [place] Pager 07/04/2015, 9:13 AM

## 2015-07-04 NOTE — Progress Notes (Signed)
[facility] Progress Note   Subjective:    S/p 2V CABG (LIMA to LAD, SVG to distal cx) 10/25 Extubated Not real talkative and won't open eyes to talk to me but does not appear uncomfortable Hyperkalemic to 6.7 (primary team gave 30 gm rectal kayexalate last PM)   Physical Exam BP 136/51 mmHg  Pulse 72  Temp(Src) 98.4 F (36.9 C) (Core (Comment))  Resp 19  Ht 5\' 11"  (1.803 m)  Wt 129.5 kg (285 lb 7.9 oz)  BMI 39.84 kg/m2  SpO2 97% Pre-dialysis bed weight: 129.5 General: Extubated, not very talkative, indicates that chest hurts Chest tubes in place bloody drainage Right IJ line/[place] Heart: S1,S2, No S3. Regular. Paced. Median sternotomy incision with dressing in place Pacemaker in place left side Lungs: Ant clear Abdomen: Protuberant abdomen. Active BS. Extremities: 1+ woody LE edema LE's. Right LE incision clean and dry Dialysis Access: RUA AVF - currently access for HD   Additional Objective  Recent Labs Lab 07/02/15 0940 06/22/2015 0550  07/07/2015 1219 07/02/2015 1350 06/21/2015 2015 07/04/2015 2018 07/04/15 0504  NA 138 134*  < > 138 140  --  137 134*  K 5.0 5.2*  < > 4.9 4.5  --  6.4* 6.7*  CL 99* 96*  < > 106  --   --  106 102  CO2 26 27  --   --   --   --   --  19*  GLUCOSE 138* 140*  < > 154* 84  --  125* 191*  BUN 68* 56*  < > 50*  --   --  54* 63*  CREATININE 12.15* 9.79*  < > 8.50*  --  9.69* 9.90* 10.35*  CALCIUM 9.5 9.5  --   --   --   --   --  9.0  PHOS  --  5.3*  --   --   --   --   --   --   < > = values in this interval not displayed.  Recent Labs Lab 07/02/15 0940 07/09/2015 0550  06/16/2015 1350 06/21/2015 2015 06/19/2015 2018 07/04/15 0504  WBC 5.9 4.9  --  8.8 15.8*  --  16.4*  HGB 9.2* 9.7*  < > 8.6*  8.8* 9.4* 9.2* 8.9*  HCT 28.3* 29.8*  < > 25.8*  26.0* 28.9* 27.0* 27.8*  MCV 95.6 95.8  --  95.2 97.0  --  98.6  PLT 76* 64*  < > 81* 113*  --  111*  < > = values in this interval not displayed.   Recent Labs Lab  06/09/2015 2311 07/04/15 0157 07/04/15 0448 07/04/15 0623 07/04/15 0744  GLUCAP 119* 138* 174* 188* 182*   Medications: Infusions: . sodium chloride 20 mL/hr at 07/04/15 0400  . sodium chloride    . sodium chloride 20 mL/hr at 07/04/15 0400  . dexmedetomidine Stopped (07/07/2015 1600)  . insulin (NOVOLIN-R) infusion 2.4 Units/hr (07/04/15 0800)  . lactated ringers    . lactated ringers    . nitroGLYCERIN 10 mcg/min (07/04/15 0800)  . phenylephrine (NEO-SYNEPHRINE) Adult infusion Stopped (06/19/2015 1645)  Scheduled: . acetaminophen  1,000 mg Oral 4 times per day   Or  . acetaminophen (TYLENOL) oral liquid 160 mg/5 mL  1,000 mg Per Tube 4 times per day  . allopurinol  150 mg Oral Daily  . antiseptic oral rinse  7 mL Mouth Rinse BID  . antiseptic oral rinse  7 mL Mouth Rinse QID  . aspirin EC  325 mg Oral Daily   Or  . aspirin  324 mg Per Tube Daily  . atorvastatin  80 mg Oral q1800  . bisacodyl  10 mg Oral Daily   Or  . bisacodyl  10 mg Rectal Daily  . cefUROXime (ZINACEF)  IV  750 g Intravenous Once  . chlorhexidine gluconate  15 mL Mouth Rinse BID  . Chlorhexidine Gluconate Cloth  6 each Topical Daily  . cinacalcet  30 mg Oral Q supper  . darbepoetin (ARANESP) injection - DIALYSIS  60 mcg Intravenous Q Mon-HD  . docusate sodium  200 mg Oral Daily  . doxercalciferol      . doxercalciferol  5 mcg Intravenous Q M,W,F-HD  . insulin regular  0-10 Units Intravenous TID WC  . loratadine  10 mg Oral Daily  . magnesium sulfate  4 g Intravenous Once  . metoprolol tartrate  12.5 mg Oral BID   Or  . metoprolol tartrate  12.5 mg Per Tube BID  . multivitamin  1 tablet Oral QHS  . mupirocin ointment  1 application Nasal BID  . [START ON 07/05/2015] pantoprazole  40 mg Oral Daily  . sodium chloride  3 mL Intravenous Q12H  . sucroferric oxyhydroxide  1,000 mg Oral TID WC   Dialysis Orders: Center: [facility] on MWF . EDW 121.5 kg HD Bath 2.0 K 2.0 Ca Time 4 hours 15 minutes Heparin 4000  Units per tx. Access RUA AVF BFR 500 DFR 800 manual  Mircera 50 mcg IV q 2 weeks (last dose 06/20/2015) Hectoral 5 mcg IV q MWF  Assessment: 1. CAD -  S/P 2 vessel CABG LIMA to LAD, SVG to distal cx 10/25. Hemodynamically stable.  2. H/O CHB w/pacemaker in place 3. ESRD - MWF. Dry weight 121.5kg. ICU bed weight today 129.5 kg. K 6.7. Dialysis today. Needs to get full treatment (early termination last 2 TMT's). Having issues with elevated venous pressures on HD - BFR only 225 - use 1K bath for 30 minutes then back to 2K and recheck K this afternoon. 4. Anemia - Started darbe 60/week on 10/24. (Rec'd last outpt dose of Mircera 06/20/15). Continue same. 5. Metabolic bone disease - Resume sensipar and binders when taking po. Hectorol with HD. 6. Nutrition - Carb mod/renal diet when taking po 7. DM -  Per primary. 8. HLD - on atorvastatin  Jamal Maes, MD [facility] [phone] Pager 07/04/2015, 8:52 AM

## 2015-07-05 ENCOUNTER — Inpatient Hospital Stay (HOSPITAL_COMMUNITY): Payer: Medicare Other

## 2015-07-05 LAB — RENAL FUNCTION PANEL
ANION GAP: 10 (ref 5–15)
Albumin: 2.6 g/dL — ABNORMAL LOW (ref 3.5–5.0)
BUN: 50 mg/dL — AB (ref 6–20)
CO2: 26 mmol/L (ref 22–32)
Calcium: 9.7 mg/dL (ref 8.9–10.3)
Chloride: 99 mmol/L — ABNORMAL LOW (ref 101–111)
Creatinine, Ser: 8.73 mg/dL — ABNORMAL HIGH (ref 0.61–1.24)
GFR calc Af Amer: 6 mL/min — ABNORMAL LOW (ref >60)
GFR calc non Af Amer: 5 mL/min — ABNORMAL LOW (ref >60)
GLUCOSE: 156 mg/dL — AB (ref 65–99)
POTASSIUM: 5.4 mmol/L — AB (ref 3.5–5.1)
Phosphorus: 6.8 mg/dL — ABNORMAL HIGH (ref 2.5–4.6)
Sodium: 135 mmol/L (ref 135–145)

## 2015-07-05 LAB — BASIC METABOLIC PANEL
Anion gap: 12 (ref 5–15)
BUN: 52 mg/dL — ABNORMAL HIGH (ref 6–20)
CO2: 25 mmol/L (ref 22–32)
Calcium: 9.8 mg/dL (ref 8.9–10.3)
Chloride: 98 mmol/L — ABNORMAL LOW (ref 101–111)
Creatinine, Ser: 9.08 mg/dL — ABNORMAL HIGH (ref 0.61–1.24)
GFR calc Af Amer: 6 mL/min — ABNORMAL LOW (ref >60)
GFR calc non Af Amer: 5 mL/min — ABNORMAL LOW (ref >60)
Glucose, Bld: 159 mg/dL — ABNORMAL HIGH (ref 65–99)
Potassium: 5.5 mmol/L — ABNORMAL HIGH (ref 3.5–5.1)
Sodium: 135 mmol/L (ref 135–145)

## 2015-07-05 LAB — GLUCOSE, CAPILLARY
GLUCOSE-CAPILLARY: 156 mg/dL — AB (ref 65–99)
GLUCOSE-CAPILLARY: 163 mg/dL — AB (ref 65–99)
GLUCOSE-CAPILLARY: 150 mg/dL — AB (ref 65–99)
GLUCOSE-CAPILLARY: 113 mg/dL — AB (ref 65–99)
Glucose-Capillary: 167 mg/dL — ABNORMAL HIGH (ref 65–99)
Glucose-Capillary: 110 mg/dL — ABNORMAL HIGH (ref 65–99)
Glucose-Capillary: 122 mg/dL — ABNORMAL HIGH (ref 65–99)

## 2015-07-05 LAB — CBC
HCT: 26.4 % — ABNORMAL LOW (ref 39.0–52.0)
Hemoglobin: 8.6 g/dL — ABNORMAL LOW (ref 13.0–17.0)
MCH: 31.7 pg (ref 26.0–34.0)
MCHC: 32.6 g/dL (ref 30.0–36.0)
MCV: 97.4 fL (ref 78.0–100.0)
Platelets: 105 10*3/uL — ABNORMAL LOW (ref 150–400)
RBC: 2.71 MIL/uL — ABNORMAL LOW (ref 4.22–5.81)
RDW: 16 % — ABNORMAL HIGH (ref 11.5–15.5)
WBC: 13.1 10*3/uL — ABNORMAL HIGH (ref 4.0–10.5)

## 2015-07-05 LAB — HEPATITIS B SURFACE ANTIGEN: Hepatitis B Surface Ag: NEGATIVE

## 2015-07-05 MED ORDER — ENOXAPARIN SODIUM 30 MG/0.3ML ~~LOC~~ SOLN
30.0000 mg | SUBCUTANEOUS | Status: DC
  Administered 2015-07-05 – 2015-07-06 (×2): 30 mg via SUBCUTANEOUS
  Filled 2015-07-05 (×2): qty 0.3

## 2015-07-05 MED ORDER — CETYLPYRIDINIUM CHLORIDE 0.05 % MT LIQD
7.0000 mL | Freq: Two times a day (BID) | OROMUCOSAL | Status: DC
  Administered 2015-07-05: 7 mL via OROMUCOSAL

## 2015-07-05 NOTE — Progress Notes (Signed)
[facility] Progress Note   Subjective:    S/p 2V CABG (LIMA to LAD, SVG to distal cx) 10/25 Had dialysis yesterday - unable to achieve adequate BFR's due to high venous pressures Potassium up again today Patient confused, not oriented, unable to answer simple questions, ? Some neglect of the left side??  Physical Exam BP 146/41 mmHg  Pulse 81  Temp(Src) 99.6 F (37.6 C) (Oral)  Resp 20  Ht 5\' 11"  (1.803 m)  Wt 128.5 kg (283 lb 4.7 oz)  BMI 39.53 kg/m2  SpO2 98%  Pre/post HD weights yesterday 131.>128.5 EDW 121.5  General: Restless. Hands in mittens. Moves extremities. Follows some commands albeit slowly. Not oriented to place. Right IJ line Heart: S1,S2, No S3. Regular.  Median sternotomy incision with dressing in place Pacemaker in place left side Lungs: Ant clear Abdomen: Protuberant abdomen. Active BS. Extremities: 1+ woody LE edema LE's. Right LE incision clean and dry. Bruising R prox thigh Dialysis Access: RUA AVF + bruit   Additional Objective  Recent Labs Lab 07/01/2015 0550  07/04/15 0504 07/04/15 1610 07/04/15 1612 07/05/15 0430  NA 134*  < > 134* 139 137 135  135  K 5.2*  < > 6.7* 5.4* 5.2* 5.5*  5.4*  CL 96*  < > 102 103 101 98*  99*  CO2 27  --  19* 23  --  25  26  GLUCOSE 140*  < > 191* 148* 147* 159*  156*  BUN 56*  < > 63* 43* 40* 52*  50*  CREATININE 9.79*  < > 10.35* 7.77*  7.42* 7.70* 9.08*  8.73*  CALCIUM 9.5  --  9.0 9.4  --  9.8  9.7  PHOS 5.3*  --   --  5.8*  --  6.8*  < > = values in this interval not displayed.  Recent Labs Lab 06/23/2015 1350 06/30/2015 2015  07/04/15 0504 07/04/15 1610 07/04/15 1612 07/05/15 0430  WBC 8.8 15.8*  --  16.4* 14.4*  --  13.1*  HGB 8.6*  8.8* 9.4*  < > 8.9* 9.1* 9.5* 8.6*  HCT 25.8*  26.0* 28.9*  < > 27.8* 26.6* 28.0* 26.4*  MCV 95.2 97.0  --  98.6 96.7  --  97.4  PLT 81* 113*  --  111* 94*  --  105*  < > = values in this interval not displayed.   Recent Labs Lab  07/04/15 1323 07/04/15 1423 07/04/15 1943 07/04/15 2327 07/05/15 0340  GLUCAP 130* 141* 157* 156* 163*   Medications: Infusions: . sodium chloride 20 mL/hr at 07/04/15 0400  . sodium chloride 250 mL (07/05/15 0400)  . sodium chloride 10 mL/hr at 07/05/15 0400  . dexmedetomidine Stopped (07/01/2015 1600)  . insulin (NOVOLIN-R) infusion Stopped (07/04/15 1400)  . lactated ringers    . lactated ringers    . nitroGLYCERIN Stopped (07/04/15 1700)  . phenylephrine (NEO-SYNEPHRINE) Adult infusion Stopped (07/04/15 1200)  Scheduled: . acetaminophen  1,000 mg Oral 4 times per day   Or  . acetaminophen (TYLENOL) oral liquid 160 mg/5 mL  1,000 mg Per Tube 4 times per day  . allopurinol  150 mg Oral Daily  . antiseptic oral rinse  7 mL Mouth Rinse BID  . antiseptic oral rinse  7 mL Mouth Rinse QID  . aspirin EC  325 mg Oral Daily   Or  . aspirin  324 mg Per Tube Daily  . atorvastatin  80 mg Oral q1800  . bisacodyl  10 mg  Oral Daily   Or  . bisacodyl  10 mg Rectal Daily  . chlorhexidine gluconate  15 mL Mouth Rinse BID  . Chlorhexidine Gluconate Cloth  6 each Topical Daily  . cinacalcet  30 mg Oral Q supper  . darbepoetin (ARANESP) injection - DIALYSIS  60 mcg Intravenous Q Mon-HD  . docusate sodium  200 mg Oral Daily  . doxercalciferol  5 mcg Intravenous Q M,W,F-HD  . insulin aspart  0-24 Units Subcutaneous 6 times per day  . insulin regular  0-10 Units Intravenous TID WC  . loratadine  10 mg Oral Daily  . magnesium sulfate  4 g Intravenous Once  . metoprolol tartrate  12.5 mg Oral BID   Or  . metoprolol tartrate  12.5 mg Per Tube BID  . multivitamin  1 tablet Oral QHS  . mupirocin ointment  1 application Nasal BID  . pantoprazole  40 mg Oral Daily  . sodium chloride  3 mL Intravenous Q12H  . sucroferric oxyhydroxide  1,000 mg Oral TID WC   Dialysis Orders: Center: [facility] on MWF . EDW 121.5 kg  HD Bath 2.0 K 2.0 Ca  Time 4 hours 15 minutes  Heparin 4000 Units per tx.   Access RUA AVF  BFR 500 DFR 800 manual  Mircera 50 mcg IV q 2 weeks (last dose 06/20/2015) Hectoral 5 mcg IV q MWF  Assessment/Recommendations: 1. CAD -  S/P 2 vessel CABG LIMA to LAD, SVG to distal cx 10/25. Hemodynamically stable.  2. Encephalopathy - confused. Doesn't follow commands well. Needs CT head. 3. 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 ESRD - MWF.  Had issues with elevated venous pressures on HD yesterday - BFR only 225 - used 1K bath for 30 minutes then back to 2K but K up to 5.5 again today. Plan dialysis again today. If unable to get BFR 500 via AVF, Dr. Servando Snare says OK for patient to go down for fistulagram. 5. Anemia - Started darbe 60/week on 10/24. (Rec'd last outpt dose of Mircera 06/20/15). Continue same. 6. Metabolic bone disease - Resume sensipar and binders when taking po. Hectorol with HD. 7. Nutrition - Carb mod/renal diet when taking po 8. DM -  Per primary. 9. HLD - on atorvastatin  Jamal Maes, MD [facility] [phone] Pager 07/05/2015, 8:23 AM

## 2015-07-05 NOTE — Op Note (Signed)
NAME:  Antonio Page, Antonio Page NO.:  [id number]  MEDICAL RECORD NO.:  [id number]  LOCATION:  [id number]                        FACILITY:  [facility]  PHYSICIAN:  Lanelle Bal, MD    DATE OF BIRTH:  [birth year]  DATE OF PROCEDURE:  06/26/2015 DATE OF DISCHARGE:                              OPERATIVE REPORT   PREOPERATIVE DIAGNOSES:  Left main disease and unstable angina and chronic dialysis-dependent renal failure.  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  SURGICAL PROCEDURE:  Coronary artery bypass grafting x2 with left internal mammary to left anterior descending coronary artery and reverse saphenous vein graft to the circumflex coronary artery with right thigh greater saphenous endo vein harvesting.  SURGEON:  Lanelle Bal, MD  FIRST ASSISTANT:  Lars Pinks, PA  BRIEF HISTORY:  The patient is a 74 year old male with underlying pulmonary disease and chronic hemodialysis who presents with increasing class IV angina.  He underwent cardiac catheterization, which revealed an eccentric calcified plaque in the distal left main.  The patient had previously had stents in the right coronary artery, which was a small nondominant vessel and it was patent.  Because of the patient's left main and circumflex obstruction, coronary artery bypass grafting was recommended, although it had increased risk because of his underlying pulmonary function and dialysis and chronic renal failure.  Risks and options were discussed with the patient in detail, and he was willing to proceed.  DESCRIPTION OF PROCEDURE:  With Swan-Ganz and arterial line monitors in place, the patient underwent general endotracheal anesthesia without incidence.  Skin of chest and legs were prepped with Betadine and draped in usual sterile manner.  Appropriate time-out was performed.  TEE probe was placed.  Findings are under separate note,  but overall the patient had preserved LV function.  After appropriate time-out, a segment of right greater saphenous vein was harvested from the right thigh endoscopically.  Median sternotomy was performed.  Left internal mammary artery was dissected down as a pedicle graft.  The distal artery was divided, had good free flow.  Pericardium was opened.  The patient had evidence of left ventricular hypertrophy.  He was systemically heparinized.  The ascending aorta was cannulated, the right atrium was cannulated, and aortic root vent cardioplegia needle was introduced into the ascending aorta.  The patient was placed on cardiopulmonary bypass 2.4 L/min/m2.  Sites of anastomosis were selected and dissected out of the epicardium.  The patient's body temperature was cooled to 32 degrees.  Aortic crossclamp was applied, 500 mL of cold blood potassium cardioplegia was administered with diastolic arrest of the heart. Myocardial septal temperature was monitored throughout the crossclamp. Attention was turned first to the distal circumflex coronary artery, which was a very diffusely diseased vessel and partially intramyocardial.  The vessel was dissected out of the epicardium, opened, admitted a 1.5 mm probe distally and proximally.  Using a running 7-0 Prolene, distal anastomosis was performed.  Additional cold blood cardioplegia was administered.  Attention was then turned to the left anterior descending coronary artery, which was a very diffusely diseased vessel and of extremely poor quality.  The distal third of the vessel was opened  and did admit a 1-mm probe distally and proximally. Using a running 8-0 Prolene, left internal mammary artery was anastomosed to left anterior descending coronary artery.  With the crossclamp still in place, single punch aortotomy was performed and the vein graft was anastomosed to the ascending aorta.  Bulldog was removed from the mammary artery with prompt rise in  myocardial septal temperature.  The heart was allowed to passively fill and de-air through the proximal anastomosis, which was then completed and aortic crossclamp was removed.  Total crossclamp time was 64 minutes.  The patient required pacing for slow rate initially, but ultimately was in a sinus rhythm.  With the body temperature rewarmed to 37 degrees, the patient was then ventilated and weaned from cardiopulmonary bypass without difficulty.  He remained hemodynamically stable.  He was decannulated in usual fashion.  Protamine sulfate was administered.  Because of low platelet count preoperatively and continued low platelet count postoperatively, he was given a single bag of platelets and because of low hematocrit preoperatively, packed red blood cells were added to the pump.  Total pump time was 109 minutes.  With the operative field hemostatic and atrial and ventricular pacing wires having been applied, pericardium was loosely reapproximated.  Sternum was closed with #6 stainless steel wire.  Fascia was closed with interrupted 0-Vicryl, running 3-0 Vicryl in the subcutaneous tissue, and 3-0 subcuticular stitch in skin edges.  Dry dressings were applied.  Sponge and needle counts were reported as correct at the completion of procedure.  The patient tolerated the procedure without obvious complication, and was transferred to the Surgical Intensive care Unit for further postoperative care.     Lanelle Bal, MD     EG/MEDQ  D:  07/04/2015  T:  07/05/2015  Job:  [id number]

## 2015-07-05 NOTE — Progress Notes (Signed)
Patient ID: KOA ZOELLER, male   DOB: November 27, 1940, 74 y.o.   MRN: [id number]  SICU Evening Rounds:  Hemodynamically stable. Sats 100%  Tolerating dialysis well  Neuro status unchanged according to nurses. Oriented only to person, neglecting left leg.

## 2015-07-05 NOTE — Progress Notes (Signed)
Patient ID: Antonio Page, male   DOB: 1941/04/30, 74 y.o.   MRN: [id number] [facility] DAILY ICU PROGRESS NOTE                   [address]            [place] [address]          [phone]   2 Days Post-Op Procedure(s) (LRB): CORONARY ARTERY BYPASS GRAFTING (CABG) times two using left internal mammary artery and right saphenous leg vein.  [place] to LAD and SVG to distal circumflex. (N/A) TRANSESOPHAGEAL ECHOCARDIOGRAM (TEE) (N/A)  Total Length of Stay:  LOS: 3 days   Subjective: Mental status is not what was preop, does follow commands , moves and extremities to command, knows his name and verbally responds  Objective: Vital signs in last 24 hours: Temp:  [97.7 F (36.5 C)-99.6 F (37.6 C)] 99.6 F (37.6 C) (10/27 0344) Pulse Rate:  [70-88] 81 (10/27 0700) Cardiac Rhythm:  [-] Normal sinus rhythm (10/27 0400) Resp:  [10-23] 20 (10/27 0700) BP: (98-155)/(27-103) 146/41 mmHg (10/27 0700) SpO2:  [90 %-100 %] 98 % (10/27 0700) Arterial Line BP: (96-187)/(36-85) 127/38 mmHg (10/26 1615) Weight:  [283 lb 4.7 oz (128.5 kg)] 283 lb 4.7 oz (128.5 kg) (10/27 0500)  Filed Weights   07/04/15 0734 07/04/15 1230 07/05/15 0500  Weight: 288 lb 12.8 oz (131 kg) 283 lb 4.7 oz (128.5 kg) 283 lb 4.7 oz (128.5 kg)    Weight change: 3 lb 4.9 oz (1.5 kg)   Hemodynamic parameters for last 24 hours: PAP: (25-52)/(10-19) 52/19 mmHg CO:  [6.8 L/min] 6.8 L/min CI:  [2.9 L/min/m2] 2.9 L/min/m2  Intake/Output from previous day: 10/26 0701 - 10/27 0700 In: 992.3 [P.O.:150; I.V.:792.3; IV Piggyback:50] Out: 2850 [Urine:20; Chest Tube:330]  Intake/Output this shift:    Current Meds: Scheduled Meds: . acetaminophen  1,000 mg Oral 4 times per day   Or  . acetaminophen (TYLENOL) oral liquid 160 mg/5 mL  1,000 mg Per Tube 4 times per day  . allopurinol  150 mg Oral Daily  . antiseptic oral rinse  7 mL Mouth Rinse BID  . antiseptic oral rinse  7 mL Mouth Rinse QID  . aspirin EC   325 mg Oral Daily   Or  . aspirin  324 mg Per Tube Daily  . atorvastatin  80 mg Oral q1800  . bisacodyl  10 mg Oral Daily   Or  . bisacodyl  10 mg Rectal Daily  . chlorhexidine gluconate  15 mL Mouth Rinse BID  . Chlorhexidine Gluconate Cloth  6 each Topical Daily  . cinacalcet  30 mg Oral Q supper  . darbepoetin (ARANESP) injection - DIALYSIS  60 mcg Intravenous Q Mon-HD  . docusate sodium  200 mg Oral Daily  . doxercalciferol  5 mcg Intravenous Q M,W,F-HD  . insulin aspart  0-24 Units Subcutaneous 6 times per day  . insulin regular  0-10 Units Intravenous TID WC  . loratadine  10 mg Oral Daily  . magnesium sulfate  4 g Intravenous Once  . metoprolol tartrate  12.5 mg Oral BID   Or  . metoprolol tartrate  12.5 mg Per Tube BID  . multivitamin  1 tablet Oral QHS  . mupirocin ointment  1 application Nasal BID  . pantoprazole  40 mg Oral Daily  . sodium chloride  3 mL Intravenous Q12H  . sucroferric oxyhydroxide  1,000 mg Oral TID WC   Continuous Infusions: .  sodium chloride 20 mL/hr at 07/04/15 0400  . sodium chloride 250 mL (07/05/15 0400)  . sodium chloride 10 mL/hr at 07/05/15 0400  . dexmedetomidine Stopped (06/20/2015 1600)  . insulin (NOVOLIN-R) infusion Stopped (07/04/15 1400)  . lactated ringers    . lactated ringers    . nitroGLYCERIN Stopped (07/04/15 1700)  . phenylephrine (NEO-SYNEPHRINE) Adult infusion Stopped (07/04/15 1200)   PRN Meds:.sodium chloride, lactated ringers, metoprolol, midazolam, morphine injection, ondansetron (ZOFRAN) IV, oxyCODONE, sodium chloride, traMADol  General appearance: no distress and slowed mentation Neurologic: intact Heart: regular rate and rhythm, S1, S2 normal, no murmur, click, rub or gallop Lungs: diminished breath sounds bibasilar Abdomen: soft, non-tender; bowel sounds normal; no masses,  no organomegaly Extremities: extremities normal, atraumatic, no cyanosis or edema and thrill in right upper arm graft Wound: sternum  stable  Lab Results: CBC: Recent Labs  07/04/15 1610 07/04/15 1612 07/05/15 0430  WBC 14.4*  --  13.1*  HGB 9.1* 9.5* 8.6*  HCT 26.6* 28.0* 26.4*  PLT 94*  --  105*   BMET:  Recent Labs  07/04/15 1610 07/04/15 1612 07/05/15 0430  NA 139 137 135  135  K 5.4* 5.2* 5.5*  5.4*  CL 103 101 98*  99*  CO2 23  --  25  26  GLUCOSE 148* 147* 159*  156*  BUN 43* 40* 52*  50*  CREATININE 7.77*  7.42* 7.70* 9.08*  8.73*  CALCIUM 9.4  --  9.8  9.7    PT/INR:  Recent Labs  06/21/2015 1350  LABPROT 17.6*  INR 1.44   Radiology: Dg Chest Port 1 View  07/05/2015  CLINICAL DATA:  Chest tube in place EXAM: PORTABLE CHEST 1 VIEW COMPARISON:  Chest radiograph from one day prior. FINDINGS: Right internal jugular central venous sheath terminates in the upper third of the superior vena cava. Tool lead left subclavian ICD in median sternotomy wires are stable in configuration. Vascular stent overlies the right axilla. Stable cardiomediastinal silhouette with mild cardiomegaly. Left apical chest tube is stable. No pneumothorax. No right pleural effusion. Stable small left pleural effusion. Low lung volumes. Stable mild pulmonary edema. Stable patchy left basilar lung consolidation. IMPRESSION: 1. No pneumothorax. 2. Stable small left pleural effusion. 3. Stable mild congestive heart failure. 4. Stable patchy consolidation at the left lung base, favor atelectasis. Electronically Signed   By: Ilona Sorrel M.D.   On: 07/05/2015 07:58     Assessment/Plan: S/P Procedure(s) (LRB): CORONARY ARTERY BYPASS GRAFTING (CABG) times two using left internal mammary artery and right saphenous leg vein.  [place] to LAD and SVG to distal circumflex. (N/A) TRANSESOPHAGEAL ECHOCARDIOGRAM (TEE) (N/A) Mobilize Diabetes control metabolic cephalopathy, no lateralizing neuro symptoms, consider ct of head after dialysis today  D/c left chest tube Pt to see  Grace Isaac 07/05/2015 8:30 AM

## 2015-07-05 NOTE — Progress Notes (Signed)
Just finishing dialysis. Still slow mentation Will get non contast ct tonight  Grace Isaac MD      [address] ,[place] [address] Office [phone]   Beeper [phone]  07/05/2015 5:30 PM

## 2015-07-05 NOTE — Progress Notes (Signed)
Pt brought in home CPAP. He is awake at this time and family is in the room. He is scheduled to go to CT at midnight. RT will attempt to put on when he gets back.

## 2015-07-06 ENCOUNTER — Inpatient Hospital Stay (HOSPITAL_COMMUNITY): Payer: Medicare Other | Admitting: Anesthesiology

## 2015-07-06 ENCOUNTER — Encounter (HOSPITAL_COMMUNITY): Payer: Self-pay | Admitting: Radiology

## 2015-07-06 ENCOUNTER — Inpatient Hospital Stay (HOSPITAL_COMMUNITY): Payer: Medicare Other

## 2015-07-06 DIAGNOSIS — J189 Pneumonia, unspecified organism: Secondary | ICD-10-CM | POA: Insufficient documentation

## 2015-07-06 DIAGNOSIS — I480 Paroxysmal atrial fibrillation: Secondary | ICD-10-CM

## 2015-07-06 DIAGNOSIS — G4733 Obstructive sleep apnea (adult) (pediatric): Secondary | ICD-10-CM

## 2015-07-06 DIAGNOSIS — Z951 Presence of aortocoronary bypass graft: Secondary | ICD-10-CM

## 2015-07-06 DIAGNOSIS — R4182 Altered mental status, unspecified: Secondary | ICD-10-CM

## 2015-07-06 DIAGNOSIS — J9601 Acute respiratory failure with hypoxia: Secondary | ICD-10-CM | POA: Insufficient documentation

## 2015-07-06 LAB — POCT I-STAT 3, ART BLOOD GAS (G3+)
ACID-BASE EXCESS: 3 mmol/L — AB (ref 0.0–2.0)
Acid-Base Excess: 2 mmol/L (ref 0.0–2.0)
BICARBONATE: 26.9 meq/L — AB (ref 20.0–24.0)
BICARBONATE: 26.7 meq/L — AB (ref 20.0–24.0)
O2 SAT: 64 %
O2 Saturation: 100 %
PCO2 ART: 39 mmHg (ref 35.0–45.0)
PCO2 ART: 42.4 mmHg (ref 35.0–45.0)
PH ART: 7.411 (ref 7.350–7.450)
PO2 ART: 32 mmHg — AB (ref 80.0–100.0)
PO2 ART: 249 mmHg — AB (ref 80.0–100.0)
Patient temperature: 98.6
Patient temperature: 100.4
TCO2: 28 mmol/L (ref 0–100)
TCO2: 28 mmol/L (ref 0–100)
pH, Arterial: 7.447 (ref 7.350–7.450)

## 2015-07-06 LAB — CBC
HCT: 24.8 % — ABNORMAL LOW (ref 39.0–52.0)
Hemoglobin: 8.1 g/dL — ABNORMAL LOW (ref 13.0–17.0)
MCH: 32 pg (ref 26.0–34.0)
MCHC: 32.7 g/dL (ref 30.0–36.0)
MCV: 98 fL (ref 78.0–100.0)
Platelets: 118 10*3/uL — ABNORMAL LOW (ref 150–400)
RBC: 2.53 MIL/uL — ABNORMAL LOW (ref 4.22–5.81)
RDW: 15.9 % — ABNORMAL HIGH (ref 11.5–15.5)
WBC: 7.8 10*3/uL (ref 4.0–10.5)

## 2015-07-06 LAB — RENAL FUNCTION PANEL
ANION GAP: 14 (ref 5–15)
Albumin: 2.3 g/dL — ABNORMAL LOW (ref 3.5–5.0)
BUN: 32 mg/dL — AB (ref 6–20)
CALCIUM: 10 mg/dL (ref 8.9–10.3)
CO2: 28 mmol/L (ref 22–32)
CREATININE: 6.67 mg/dL — AB (ref 0.61–1.24)
Chloride: 98 mmol/L — ABNORMAL LOW (ref 101–111)
GFR calc Af Amer: 8 mL/min — ABNORMAL LOW (ref >60)
GFR calc non Af Amer: 7 mL/min — ABNORMAL LOW (ref >60)
GLUCOSE: 141 mg/dL — AB (ref 65–99)
Phosphorus: 6 mg/dL — ABNORMAL HIGH (ref 2.5–4.6)
Potassium: 4.8 mmol/L (ref 3.5–5.1)
SODIUM: 140 mmol/L (ref 135–145)

## 2015-07-06 LAB — TYPE AND SCREEN
ABO/RH(D): O POS
Antibody Screen: NEGATIVE
Unit division: 0
Unit division: 0

## 2015-07-06 LAB — GLUCOSE, CAPILLARY
GLUCOSE-CAPILLARY: 141 mg/dL — AB (ref 65–99)
Glucose-Capillary: 130 mg/dL — ABNORMAL HIGH (ref 65–99)
Glucose-Capillary: 133 mg/dL — ABNORMAL HIGH (ref 65–99)
Glucose-Capillary: 156 mg/dL — ABNORMAL HIGH (ref 65–99)
Glucose-Capillary: 132 mg/dL — ABNORMAL HIGH (ref 65–99)

## 2015-07-06 LAB — PROCALCITONIN: Procalcitonin: 13.03 ng/mL

## 2015-07-06 LAB — MAGNESIUM: Magnesium: 1.8 mg/dL (ref 1.7–2.4)

## 2015-07-06 LAB — AMMONIA: Ammonia: 20 umol/L (ref 9–35)

## 2015-07-06 LAB — TSH: TSH: 2.885 u[IU]/mL (ref 0.350–4.500)

## 2015-07-06 LAB — LACTIC ACID, PLASMA: Lactic Acid, Venous: 1.2 mmol/L (ref 0.5–2.0)

## 2015-07-06 MED ORDER — AMIODARONE HCL IN DEXTROSE 360-4.14 MG/200ML-% IV SOLN
60.0000 mg/h | INTRAVENOUS | Status: AC
  Administered 2015-07-06 (×2): 60 mg/h via INTRAVENOUS
  Filled 2015-07-06 (×2): qty 200

## 2015-07-06 MED ORDER — ATORVASTATIN CALCIUM 80 MG PO TABS
80.0000 mg | ORAL_TABLET | Freq: Every day | ORAL | Status: DC
  Filled 2015-07-06: qty 1

## 2015-07-06 MED ORDER — PROPOFOL 1000 MG/100ML IV EMUL
INTRAVENOUS | Status: AC
  Administered 2015-07-06: 20 ug/kg/min via INTRAVENOUS
  Filled 2015-07-06: qty 100

## 2015-07-06 MED ORDER — VANCOMYCIN HCL IN DEXTROSE 1-5 GM/200ML-% IV SOLN
1000.0000 mg | INTRAVENOUS | Status: DC
  Administered 2015-07-06: 1000 mg via INTRAVENOUS
  Filled 2015-07-06: qty 200

## 2015-07-06 MED ORDER — AMIODARONE HCL IN DEXTROSE 360-4.14 MG/200ML-% IV SOLN
30.0000 mg/h | INTRAVENOUS | Status: DC
  Administered 2015-07-06 – 2015-07-07 (×3): 30 mg/h via INTRAVENOUS
  Filled 2015-07-06 (×11): qty 200

## 2015-07-06 MED ORDER — INSULIN ASPART 100 UNIT/ML ~~LOC~~ SOLN
0.0000 [IU] | SUBCUTANEOUS | Status: DC
  Administered 2015-07-06: 2 [IU] via SUBCUTANEOUS
  Administered 2015-07-06 (×2): 1 [IU] via SUBCUTANEOUS
  Administered 2015-07-07 (×3): 2 [IU] via SUBCUTANEOUS

## 2015-07-06 MED ORDER — PANTOPRAZOLE SODIUM 40 MG PO PACK
40.0000 mg | PACK | Freq: Every day | ORAL | Status: DC
  Administered 2015-07-06 – 2015-07-07 (×2): 40 mg
  Filled 2015-07-06 (×3): qty 20

## 2015-07-06 MED ORDER — VANCOMYCIN HCL 10 G IV SOLR
2000.0000 mg | Freq: Once | INTRAVENOUS | Status: AC
  Administered 2015-07-06: 2000 mg via INTRAVENOUS
  Filled 2015-07-06: qty 2000

## 2015-07-06 MED ORDER — CETYLPYRIDINIUM CHLORIDE 0.05 % MT LIQD
7.0000 mL | Freq: Two times a day (BID) | OROMUCOSAL | Status: DC
  Administered 2015-07-06 – 2015-07-08 (×6): 7 mL via OROMUCOSAL

## 2015-07-06 MED ORDER — PROPOFOL 1000 MG/100ML IV EMUL
5.0000 ug/kg/min | INTRAVENOUS | Status: DC
Start: 2015-07-06 — End: 2015-07-08
  Administered 2015-07-06: 20 ug/kg/min via INTRAVENOUS
  Administered 2015-07-07: 5 ug/kg/min via INTRAVENOUS
  Filled 2015-07-06: qty 100

## 2015-07-06 MED ORDER — AMIODARONE IV BOLUS ONLY 150 MG/100ML
150.0000 mg | Freq: Once | INTRAVENOUS | Status: AC
  Administered 2015-07-06: 150 mg via INTRAVENOUS
  Filled 2015-07-06: qty 100

## 2015-07-06 MED ORDER — ALBUMIN HUMAN 25 % IV SOLN
INTRAVENOUS | Status: AC
Start: 2015-07-06 — End: 2015-07-06
  Filled 2015-07-06: qty 150

## 2015-07-06 MED ORDER — DEXTROSE 5 % IV SOLN
2.0000 g | Freq: Once | INTRAVENOUS | Status: AC
  Administered 2015-07-06: 2 g via INTRAVENOUS
  Filled 2015-07-06: qty 2

## 2015-07-06 MED ORDER — ETOMIDATE 2 MG/ML IV SOLN
INTRAVENOUS | Status: DC | PRN
  Administered 2015-07-06: 14 mg via INTRAVENOUS

## 2015-07-06 MED ORDER — SODIUM CHLORIDE 0.9 % IV BOLUS (SEPSIS)
500.0000 mL | Freq: Once | INTRAVENOUS | Status: AC
  Administered 2015-07-06: 500 mL via INTRAVENOUS

## 2015-07-06 MED ORDER — AMIODARONE LOAD VIA INFUSION: 150.0000 mg | Freq: Once | INTRAVENOUS | Status: DC

## 2015-07-06 MED ORDER — LIDOCAINE HCL 1 % IJ SOLN
INTRAMUSCULAR | Status: AC
  Filled 2015-07-06: qty 20

## 2015-07-06 MED ORDER — CHLORHEXIDINE GLUCONATE 0.12 % MT SOLN
15.0000 mL | Freq: Two times a day (BID) | OROMUCOSAL | Status: DC
  Administered 2015-07-06 – 2015-07-07 (×4): 15 mL via OROMUCOSAL
  Filled 2015-07-06: qty 15

## 2015-07-06 MED ORDER — SUCCINYLCHOLINE CHLORIDE 20 MG/ML IJ SOLN
INTRAMUSCULAR | Status: DC | PRN
  Administered 2015-07-06: 100 mg via INTRAVENOUS

## 2015-07-06 MED ORDER — SODIUM CHLORIDE 0.9 % IV SOLN: INTRAVENOUS | Status: DC | PRN

## 2015-07-06 MED ORDER — HEPARIN SODIUM (PORCINE) 5000 UNIT/ML IJ SOLN
5000.0000 [IU] | Freq: Three times a day (TID) | INTRAMUSCULAR | Status: DC
  Administered 2015-07-06 – 2015-07-08 (×4): 5000 [IU] via SUBCUTANEOUS
  Administered 2015-07-08: 2400 [IU] via SUBCUTANEOUS
  Filled 2015-07-06 (×7): qty 1

## 2015-07-06 MED ORDER — ACETAMINOPHEN 650 MG RE SUPP
650.0000 mg | RECTAL | Status: DC | PRN
  Administered 2015-07-06: 650 mg via RECTAL
  Filled 2015-07-06: qty 1

## 2015-07-06 MED ORDER — NEPRO/CARBSTEADY PO LIQD
1000.0000 mL | ORAL | Status: DC
  Administered 2015-07-06: 1000 mL
  Filled 2015-07-06 (×3): qty 1000

## 2015-07-06 MED ORDER — ACETAMINOPHEN 160 MG/5ML PO SOLN
650.0000 mg | Freq: Four times a day (QID) | ORAL | Status: DC | PRN
  Administered 2015-07-06 – 2015-07-08 (×5): 650 mg via ORAL
  Filled 2015-07-06 (×5): qty 20.3

## 2015-07-06 NOTE — Consult Note (Signed)
NEURO HOSPITALIST CONSULT NOTE   Referring physician: Servando Snare   Reason for Consult: AMS S/P CABG  HPI:                                                                                                                                          Antonio Page is an 74 y.o. male Presenting to hospital due to progressive CP and found to have multiple coronary arteries with significant stenosis requiring CABG. Patient currently has a pacemaker and also has ESRD with HD three times a week. He underwent a CABG on 06/23/2015. Patient was extubated on 10/26.  He was noted to not be talkative on that day and able to recognize his family members but was still confused per wife. The following day he was noted to have slow mentation and today is drowsy not following commands. Over night it was noted he had new onset of Afib. Cardiology is planning to cardiovert patient today.   Past Medical History  Diagnosis Date  . Hypertension   . GERD (gastroesophageal reflux disease)   . Arthritis   . Glaucoma   . Blind right eye   . Coronary artery disease     a. s/p PCI/stent-> RCA;  b. 02/2015 MV: infarct/no ischemia;  c. 06/2015 Cath: LM 80,LCX 95ost, 50m, RCA patent stent.  . Hypoxia 01/11/2015  . CHB (complete heart block) ([place]) 01/08/15    PPM placed MDT  . ESRD needing dialysis [facility])     MWF at [facility] (06/29/2015)  . Kidney stones   . Type II diabetes mellitus ([place])   . OSA on CPAP   . Presence of permanent cardiac pacemaker     Past Surgical History  Procedure Laterality Date  . Dg av dialysis  shunt access exist*l* or Left 2010    upper arm-not using  . Dg av dialysis  shunt access exist*l* or Right 2012    upper arm-using this one  . Shoulder arthroscopy Right   . Knee arthroscopy Left   . Tonsillectomy    . Coronary angioplasty with stent placement   1999     RCA  . Trigger finger release  06/08/2012    Procedure: RELEASE TRIGGER FINGER/A-1  PULLEY;  Surgeon: Cammie Sickle., MD;  Location: [place];  Service: Orthopedics;  Laterality: Right;  . Dupuytren contracture release  06/08/2012    Procedure: DUPUYTREN CONTRACTURE RELEASE;  Surgeon: Cammie Sickle., MD;  Location: [place];  Service: Orthopedics;  Laterality: Right;  right ring fascia excision with A1 Pulley right ring  . Cystoscopy with retrograde pyelogram, ureteroscopy and stent placement  07/21/2012    Procedure: CYSTOSCOPY WITH RETROGRADE PYELOGRAM, URETEROSCOPY AND STENT PLACEMENT;  Surgeon: Malka So, MD;  Location: [facility];  Service: Urology;  Laterality: Right;  . Mass excision  07/26/2012    Procedure: MINOR EXCISION OF MASS;  Surgeon: Izora Gala, MD;  Location: [place];  Service: ENT;  Laterality: Right;  Excision of a Right Ear Skin Cancer with Primary Closure  wound class per Dr. Constance Holster   . Tee without cardioversion N/A 03/31/2013    Procedure: TRANSESOPHAGEAL ECHOCARDIOGRAM (TEE);  Surgeon: Sanda Klein, MD;  Location: [facility] ENDOSCOPY;  Service: Cardiovascular;  Laterality: N/A;  . Revision of arteriovenous goretex graft Right 10/19/2013    Procedure: REVISION OF ARTERIOVENOUS GORETEX GRAFT - thrombectomy;  Surgeon: Rosetta Posner, MD;  Location: [place];  Service: Vascular;  Laterality: Right;  . Cardiac catheterization N/A 01/08/2015    Procedure: Temporary Pacemaker;  Surgeon: Sanda Klein, MD;  Location: [place] INVASIVE CV LAB CUPID;  Service: Cardiovascular;  Laterality: N/A;  . Ep implantable device N/A 01/10/2015    Procedure: Pacemaker Implant;  Surgeon: Deboraha Sprang, MD;  Location: [facility] INVASIVE CV LAB CUPID;  Service: Cardiovascular;  Laterality: N/A;  . Cardiac catheterization N/A 06/17/2015    Procedure: Left Heart Cath and Coronary Angiography;  Surgeon: Lorretta Harp, MD;  Location: [place] CV LAB;  Service: Cardiovascular;  Laterality: N/A;  . Insert / replace / remove pacemaker    . Eye  surgery Right multiple  . Coronary artery bypass graft N/A 06/15/2015    Procedure: CORONARY ARTERY BYPASS GRAFTING (CABG) times two using left internal mammary artery and right saphenous leg vein.  Pray to LAD and SVG to distal circumflex.;  Surgeon: Grace Isaac, MD;  Location: [place];  Service: Open Heart Surgery;  Laterality: N/A;  . Tee without cardioversion N/A 07/05/2015    Procedure: TRANSESOPHAGEAL ECHOCARDIOGRAM (TEE);  Surgeon: Grace Isaac, MD;  Location: [place];  Service: Open Heart Surgery;  Laterality: N/A;    Family History  Problem Relation Age of Onset  . Heart disease Father   . Kidney disease Father   . Hypertension Mother   . Cancer Brother   . Hypertension Brother      Social History:  reports that he quit smoking about 20 years ago. His smoking use included Cigars. He does not have any smokeless tobacco history on file. He reports that he does not drink alcohol or use illicit drugs.  No Known Allergies  MEDICATIONS:                                                                                                                     Prior to Admission:  Prescriptions prior to admission  Medication Sig Dispense Refill Last Dose  . acetaminophen (TYLENOL) 500 MG tablet Take 1,000 mg by mouth daily as needed (pain).   06/27/2015  . allopurinol (ZYLOPRIM) 300 MG tablet Take 150 mg by mouth daily.    07/01/2015 at Unknown time  . amLODipine (NORVASC) 10 MG tablet Take 10 mg by mouth at bedtime.    06/27/2015  .  aspirin EC 81 MG tablet Take 81 mg by mouth daily.   07/03/2015  . B Complex-C-Folic Acid (RENA-VITE RX) 1 MG TABS Take 1 mg by mouth daily.  3 07/07/2015  . cetirizine (ZYRTEC) 10 MG tablet Take 10 mg by mouth daily as needed for allergies.    06/27/2015  . cinacalcet (SENSIPAR) 30 MG tablet Take 30 mg by mouth daily with supper.   06/27/2015  . clobetasol cream (TEMOVATE) 8.65 % Apply 1 application topically daily as needed (rash on lower legs/ ankles).    few months ago  . Clotrimazole (LOTRIMIN AF EX) Apply 1 application topically daily as needed (athlete's foot/itching).   06/29/2015  . insulin NPH-insulin regular (NOVOLIN 70/30) (70-30) 100 UNIT/ML injection Inject 40 Units into the skin 2 (two) times daily.    06/13/2015  . isosorbide mononitrate (IMDUR) 30 MG 24 hr tablet Take 0.5 tablets (15 mg total) by mouth daily. 30 tablet 11 06/24/2015  . losartan (COZAAR) 100 MG tablet Take 100 mg by mouth 4 (four) times a week. Take 1 tablet (100 mg) by; mouth on Sunday, Tuesday, Thursday, Saturday - mornings (non-dialysis days)  3 06/17/2015  . nitroGLYCERIN (NITROSTAT) 0.4 MG SL tablet Place 1 tablet (0.4 mg total) under the tongue every 5 (five) minutes x 3 doses as needed for chest pain. 25 tablet 12 week ago  . omeprazole (PRILOSEC) 20 MG capsule Take 40 mg by mouth daily.    06/25/2015  . polyethylene glycol (MIRALAX / GLYCOLAX) packet Take 17 g by mouth daily.    06/18/2015  . PRESCRIPTION MEDICATION at bedtime. Cpap with oxygen 2%   06/27/2015  . sodium chloride (OCEAN) 0.65 % SOLN nasal spray Place 1 spray into the nose daily.    06/27/2015  . sucroferric oxyhydroxide (VELPHORO) 500 MG chewable tablet Chew 1,000 mg by mouth 3 (three) times daily with meals.   06/11/2015   Scheduled: . acetaminophen  1,000 mg Oral 4 times per day   Or  . acetaminophen (TYLENOL) oral liquid 160 mg/5 mL  1,000 mg Per Tube 4 times per day  . allopurinol  150 mg Oral Daily  . amiodarone  150 mg Intravenous Once  . antiseptic oral rinse  7 mL Mouth Rinse q12n4p  . aspirin EC  325 mg Oral Daily   Or  . aspirin  324 mg Per Tube Daily  . atorvastatin  80 mg Oral q1800  . bisacodyl  10 mg Oral Daily   Or  . bisacodyl  10 mg Rectal Daily  . chlorhexidine  15 mL Mouth Rinse BID  . Chlorhexidine Gluconate Cloth  6 each Topical Daily  . cinacalcet  30 mg Oral Q supper  . darbepoetin (ARANESP) injection - DIALYSIS  60 mcg Intravenous Q Mon-HD  . docusate sodium   200 mg Oral Daily  . doxercalciferol  5 mcg Intravenous Q M,W,F-HD  . enoxaparin (LOVENOX) injection  30 mg Subcutaneous Q24H  . insulin aspart  0-24 Units Subcutaneous 6 times per day  . insulin regular  0-10 Units Intravenous TID WC  . loratadine  10 mg Oral Daily  . magnesium sulfate  4 g Intravenous Once  . metoprolol tartrate  12.5 mg Oral BID   Or  . metoprolol tartrate  12.5 mg Per Tube BID  . multivitamin  1 tablet Oral QHS  . mupirocin ointment  1 application Nasal BID  . pantoprazole  40 mg Oral Daily  . sodium chloride  3 mL Intravenous Q12H  .  sucroferric oxyhydroxide  1,000 mg Oral TID WC     ROS:                                                                                                                                       History obtained from unobtainable from patient due to mental status   Blood pressure 137/79, pulse 111, temperature 100.2 F (37.9 C), temperature source Oral, resp. rate 18, height 5\' 11"  (1.803 m), weight 125.8 kg (277 lb 5.4 oz), SpO2 99 %.   Neurologic Examination:                                                                                                      HEENT-  Normocephalic, no lesions, without obvious abnormality.  Normal external eye and conjunctiva.  Normal TM's bilaterally.  Normal auditory canals and external ears. Normal external nose, mucus membranes and septum.  Normal pharynx. Cardiovascular- S1, S2 normal, pulses palpable throughout   Lungs- chest clear, no wheezing, rales, normal symmetric air entry Abdomen- normal findings: bowel sounds normal Extremities- no edema Lymph-no adenopathy palpable Musculoskeletal-no joint tenderness, deformity or swelling Skin-warm and dry, no hyperpigmentation, vitiligo, or suspicious lesions  Neurological Examination Mental Status: Keeps eyes closed, follows no commands but does wince to noxious stimuli.  No localization to noxious stimuli. No verbal output but does grunt to  noxious stimuli.  Does not follow commands.   Cranial Nerves: II:  No blink to threat, left pupil round 47mm reactive to light , right pupil difficult to visualize due to significant scaring III,IV, VI: No response to oculocephalic maneuver.  V,VII: face symmetric, absent corneals bilateally VIII: unable to test IX/X: unable to test XI: bilateral shoulder shrug XII: unable to test Motor: Moving all extremities antigravity spontaneously but could not assess strength as he is not following commands.  Sensory: grunts to noxious stimuli in all extremities Deep Tendon Reflexes: 2+ and symmetric throughout  UE no KJ no AJ Plantars: Mute bilaterally Cerebellar: Unable to assess Gait: unable to assess   Lab Results: Basic Metabolic Panel:  Recent Labs Lab 06/18/2015 0550  07/02/2015 2015  07/04/15 0504 07/04/15 1610 07/04/15 1612 07/05/15 0430 07/06/15 0410  NA 134*  < >  --   < > 134* 139 137 135  135 140  K 5.2*  < >  --   < > 6.7* 5.4* 5.2* 5.5*  5.4* 4.8  CL 96*  < >  --   < > 102 103  101 98*  99* 98*  CO2 27  --   --   --  19* 23  --  25  26 28   GLUCOSE 140*  < >  --   < > 191* 148* 147* 159*  156* 141*  BUN 56*  < >  --   < > 63* 43* 40* 52*  50* 32*  CREATININE 9.79*  < > 9.69*  < > 10.35* 7.77*  7.42* 7.70* 9.08*  8.73* 6.67*  CALCIUM 9.5  --   --   --  9.0 9.4  --  9.8  9.7 10.0  MG  --   --  1.9  --  2.1 1.9  --   --   --   PHOS 5.3*  --   --   --   --  5.8*  --  6.8* 6.0*  < > = values in this interval not displayed.  Liver Function Tests:  Recent Labs Lab 07/07/2015 0550 07/04/15 1610 07/05/15 0430 07/06/15 0410  ALBUMIN 3.4* 2.7* 2.6* 2.3*   No results for input(s): LIPASE, AMYLASE in the last 168 hours. No results for input(s): AMMONIA in the last 168 hours.  CBC:  Recent Labs Lab 07/01/2015 2015  07/04/15 0504 07/04/15 1610 07/04/15 1612 07/05/15 0430 07/06/15 0410  WBC 15.8*  --  16.4* 14.4*  --  13.1* 7.8  HGB 9.4*  < > 8.9* 9.1* 9.5* 8.6*  8.1*  HCT 28.9*  < > 27.8* 26.6* 28.0* 26.4* 24.8*  MCV 97.0  --  98.6 96.7  --  97.4 98.0  PLT 113*  --  111* 94*  --  105* 118*  < > = values in this interval not displayed.  Cardiac Enzymes: No results for input(s): CKTOTAL, CKMB, CKMBINDEX, TROPONINI in the last 168 hours.  Lipid Panel: No results for input(s): CHOL, TRIG, HDL, CHOLHDL, VLDL, LDLCALC in the last 168 hours.  CBG:  Recent Labs Lab 07/05/15 1639 07/05/15 1933 07/05/15 2321 07/06/15 0419 07/06/15 0751  GLUCAP 110* 122* 113* 130* 133*    Microbiology: Results for orders placed or performed during the hospital encounter of 06/19/2015  Surgical pcr screen     Status: Abnormal   Collection Time: 07/02/15 10:12 PM  Result Value Ref Range Status   MRSA, PCR NEGATIVE NEGATIVE Final   Staphylococcus aureus POSITIVE (A) NEGATIVE Final    Comment:        The Xpert SA Assay (FDA approved for NASAL specimens in patients over 62 years of age), is one component of a comprehensive surveillance program.  Test performance has been validated by [facility] for patients greater than or equal to 1 year old. It is not intended to diagnose infection nor to guide or monitor treatment.     Coagulation Studies:  Recent Labs  06/27/2015 1350  LABPROT 17.6*  INR 1.44    Imaging: Ct Head Wo Contrast  07/06/2015  CLINICAL DATA:  74 year old male with worsening confusion. EXAM: CT HEAD WITHOUT CONTRAST TECHNIQUE: Contiguous axial images were obtained from the base of the skull through the vertex without intravenous contrast. COMPARISON:  Head CT 01/2015. FINDINGS: Small sclerotic right globe. Patchy and confluent areas of decreased attenuation are noted throughout the deep and periventricular white matter of the cerebral hemispheres bilaterally, compatible with chronic microvascular ischemic disease. No acute intracranial abnormalities. Specifically, no evidence of acute intracranial hemorrhage, no definite findings of  acute/subacute cerebral ischemia, no mass, mass effect, hydrocephalus or abnormal intra or extra-axial fluid collections.  Visualized paranasal sinuses and mastoids are well pneumatized. No acute displaced skull fractures are identified. IMPRESSION: 1. No acute intracranial abnormalities. 2. Chronic microvascular ischemic changes in cerebral white matter as above. Electronically Signed   By: Vinnie Langton M.D.   On: 07/06/2015 02:07   Dg Chest Port 1 View  07/06/2015  CLINICAL DATA:  Status post CABG on July 03, 2015, dialysis dependent renal failure, pacemaker. EXAM: PORTABLE CHEST 1 VIEW COMPARISON:  Portable chest x-rays of October 26 and 03/28/2015 FINDINGS: The lungs are slightly better inflated today. The interstitial markings remain increased. There is left basilar atelectasis. A there is a small left pleural effusion. The cardiac silhouette is enlarged. The central pulmonary vascularity is more prominent today. The right internal jugular Cordis sheath tip projects over the proximal SVC. There are 6 intact sternal wires visible on today's study. The permanent pacemaker is in reasonable position. IMPRESSION: Increased prominence of the pulmonary vascularity and pulmonary interstitium since yesterday's study is consistent with slight worsening of CHF. There is persistent left lower lobe atelectasis and small left pleural effusion. Electronically Signed   By: David  Martinique M.D.   On: 07/06/2015 07:31   Dg Chest Port 1 View  07/05/2015  CLINICAL DATA:  Chest tube in place EXAM: PORTABLE CHEST 1 VIEW COMPARISON:  Chest radiograph from one day prior. FINDINGS: Right internal jugular central venous sheath terminates in the upper third of the superior vena cava. Tool lead left subclavian ICD in median sternotomy wires are stable in configuration. Vascular stent overlies the right axilla. Stable cardiomediastinal silhouette with mild cardiomegaly. Left apical chest tube is stable. No pneumothorax. No  right pleural effusion. Stable small left pleural effusion. Low lung volumes. Stable mild pulmonary edema. Stable patchy left basilar lung consolidation. IMPRESSION: 1. No pneumothorax. 2. Stable small left pleural effusion. 3. Stable mild congestive heart failure. 4. Stable patchy consolidation at the left lung base, favor atelectasis. Electronically Signed   By: Ilona Sorrel M.D.   On: 07/05/2015 07:58    Etta Quill PA-C [facility] [phone]  07/06/2015, 9:00 AM   Patient seen and examined.  Clinical course and management discussed.  Necessary edits performed.  I agree with the above.  Assessment and plan of care developed and discussed below.     Assessment/Plan: 74 year old male poorly responsive s/p CABG.  Has developed new onset atrial fibrillation but has spontaneously converted to sinus.  Head CT performed this morning personally reviewed and shows no acute changes.  No focal findings on neurological examination but can not rule out a shower of emboli.  Patient unable to have MRI due to pacer and recent surgery.  Patient on ASA.    Recommendations: 1.  EEG 2.  Repeat head CT in 2-3 days.     Alexis Goodell, MD [facility] [phone]  07/06/2015  11:13 AM

## 2015-07-06 NOTE — Consult Note (Signed)
PULMONARY / CRITICAL CARE MEDICINE   Name: Antonio Page MRN: [id number] DOB: [birth year]    ADMISSION DATE:  07/01/2015 CONSULTATION DATE:  07/06/2015  REFERRING MD :  Lanelle Bal, M.D.  CHIEF COMPLAINT:  Obtundation & Acute Hypoxic Respiratory Failure  INITIAL PRESENTATION: 74 year old male with known history of diabetes mellitus and coronary artery disease as well as end-stage renal disease on hemodialysis who last underwent coronary artery stent placement to the RCA in 1999. Patient presented to Hospital and was admitted for angina. Given the severity of his underlying coronary artery disease Dr. Servando Snare took the patient to surgery for bypass on 10/25.  STUDIES:  CT HEAD W/O 10/28 - No acute abnormalities. Chronic microvascular ischemic changes. PORTABLE CXR 10/28 - personally reviewed by me shows a minimal left lower lobe infiltrate. Good positioning of endotracheal tube. Blunting of left costophrenic angle.  EEG - PENDING  SIGNIFICANT EVENTS: 10/20 - Admitted to Hospital 10/25 - CABG 10/26 - Extubation 10/28 - New Onset Atrial fibrillation 10/28 - Intubated for obtunded status & acute hypoxic respiratory failure  HISTORY OF PRESENT ILLNESS:  Patient is currently intubated and sedated postintubation. History obtained from discussion with Dr. Servando Snare, the patient's nurse at bedside, & review of the electronic medical record. Patient had progressively worsening mental status and the days postextubation. The patient also had progressively worsening acute hypoxic respiratory failure. He underwent evaluation by neurology this morning at which time they documented the patient did respond to noxious stimuli. After the patient's hemodialysis session he was noted to have progressively worsening hypoxia requiring placement on his CPAP. With the patient's continued altered mentation despite adequate ventilation and worsening hypoxia he was emergently endotracheally intubated by  anesthesia. Postintubation he continued to have persistent hypoxia and thick secretions. An emergent bedside bronchoscopy was performed with suctioning to evaluate for possible mucus plugging & residual secretions. The patient has not received any sedating medications over the last 24-48 hours.  We were consulted to assist with the patient's worsening acute hypoxic respiratory failure & altered mental status.  PAST MEDICAL HISTORY :  Past Medical History  Diagnosis Date  . Hypertension   . GERD (gastroesophageal reflux disease)   . Arthritis   . Glaucoma   . Blind right eye   . Coronary artery disease     a. s/p PCI/stent-> RCA;  b. 02/2015 MV: infarct/no ischemia;  c. 06/2015 Cath: LM 80,LCX 95ost, 38m, RCA patent stent.  . Hypoxia 01/11/2015  . CHB (complete heart block) ([place]) 01/08/15    PPM placed MDT  . ESRD needing dialysis [facility])     MWF at [facility] (06/29/2015)  . Kidney stones   . Type II diabetes mellitus ([place])   . OSA on CPAP   . Presence of permanent cardiac pacemaker    PAST SURGICAL HISTORY: Past Surgical History  Procedure Laterality Date  . Dg av dialysis  shunt access exist*l* or Left 2010    upper arm-not using  . Dg av dialysis  shunt access exist*l* or Right 2012    upper arm-using this one  . Shoulder arthroscopy Right   . Knee arthroscopy Left   . Tonsillectomy    . Coronary angioplasty with stent placement   1999     RCA  . Trigger finger release  06/08/2012    Procedure: RELEASE TRIGGER FINGER/A-1 PULLEY;  Surgeon: Cammie Sickle., MD;  Location: [place];  Service: Orthopedics;  Laterality: Right;  . Dupuytren contracture release  06/08/2012    Procedure: DUPUYTREN CONTRACTURE RELEASE;  Surgeon: Cammie Sickle., MD;  Location: [place];  Service: Orthopedics;  Laterality: Right;  right ring fascia excision with A1 Pulley right ring  . Cystoscopy with retrograde pyelogram, ureteroscopy and stent  placement  07/21/2012    Procedure: [place], URETEROSCOPY AND STENT PLACEMENT;  Surgeon: Malka So, MD;  Location: [facility];  Service: Urology;  Laterality: Right;  . Mass excision  07/26/2012    Procedure: MINOR EXCISION OF MASS;  Surgeon: Izora Gala, MD;  Location: [place];  Service: ENT;  Laterality: Right;  Excision of a Right Ear Skin Cancer with Primary Closure  wound class per Dr. Constance Holster   . Tee without cardioversion N/A 03/31/2013    Procedure: TRANSESOPHAGEAL ECHOCARDIOGRAM (TEE);  Surgeon: Sanda Klein, MD;  Location: [facility] ENDOSCOPY;  Service: Cardiovascular;  Laterality: N/A;  . Revision of arteriovenous goretex graft Right 10/19/2013    Procedure: REVISION OF ARTERIOVENOUS GORETEX GRAFT - thrombectomy;  Surgeon: Rosetta Posner, MD;  Location: [place];  Service: Vascular;  Laterality: Right;  . Cardiac catheterization N/A 01/08/2015    Procedure: Temporary Pacemaker;  Surgeon: Sanda Klein, MD;  Location: [place] INVASIVE CV LAB CUPID;  Service: Cardiovascular;  Laterality: N/A;  . Ep implantable device N/A 01/10/2015    Procedure: Pacemaker Implant;  Surgeon: Deboraha Sprang, MD;  Location: [facility] INVASIVE CV LAB CUPID;  Service: Cardiovascular;  Laterality: N/A;  . Cardiac catheterization N/A 06/19/2015    Procedure: Left Heart Cath and Coronary Angiography;  Surgeon: Lorretta Harp, MD;  Location: [place] CV LAB;  Service: Cardiovascular;  Laterality: N/A;  . Insert / replace / remove pacemaker    . Eye surgery Right multiple  . Coronary artery bypass graft N/A 07/02/2015    Procedure: CORONARY ARTERY BYPASS GRAFTING (CABG) times two using left internal mammary artery and right saphenous leg vein.  [place] to LAD and SVG to distal circumflex.;  Surgeon: Grace Isaac, MD;  Location: [place];  Service: Open Heart Surgery;  Laterality: N/A;  . Tee without cardioversion N/A 06/25/2015    Procedure: TRANSESOPHAGEAL ECHOCARDIOGRAM (TEE);  Surgeon:  Grace Isaac, MD;  Location: [place];  Service: Open Heart Surgery;  Laterality: N/A;  . Medtronic adapta pacemaker insection      Prior to Admission medications   Medication Sig Start Date End Date Taking? Authorizing Provider  acetaminophen (TYLENOL) 500 MG tablet Take 1,000 mg by mouth daily as needed (pain).   Yes Historical Provider, MD  allopurinol (ZYLOPRIM) 300 MG tablet Take 150 mg by mouth daily.    Yes Historical Provider, MD  amLODipine (NORVASC) 10 MG tablet Take 10 mg by mouth at bedtime.    Yes Historical Provider, MD  aspirin EC 81 MG tablet Take 81 mg by mouth daily.   Yes Historical Provider, MD  B Complex-C-Folic Acid (RENA-VITE RX) 1 MG TABS Take 1 mg by mouth daily. 03/24/15  Yes Historical Provider, MD  cetirizine (ZYRTEC) 10 MG tablet Take 10 mg by mouth daily as needed for allergies.    Yes Historical Provider, MD  cinacalcet (SENSIPAR) 30 MG tablet Take 30 mg by mouth daily with supper.   Yes Historical Provider, MD  clobetasol cream (TEMOVATE) 7.35 % Apply 1 application topically daily as needed (rash on lower legs/ ankles).   Yes Historical Provider, MD  Clotrimazole (LOTRIMIN AF EX) Apply 1 application topically daily as needed (athlete's foot/itching).  Yes Historical Provider, MD  insulin NPH-insulin regular (NOVOLIN 70/30) (70-30) 100 UNIT/ML injection Inject 40 Units into the skin 2 (two) times daily.    Yes Historical Provider, MD  isosorbide mononitrate (IMDUR) 30 MG 24 hr tablet Take 0.5 tablets (15 mg total) by mouth daily. 06/26/15  Yes Scott T Kathlen Mody, PA-C  losartan (COZAAR) 100 MG tablet Take 100 mg by mouth 4 (four) times a week. Take 1 tablet (100 mg) by; mouth on Sunday, Tuesday, Thursday, Saturday - mornings (non-dialysis days) 12/14/14  Yes Historical Provider, MD  nitroGLYCERIN (NITROSTAT) 0.4 MG SL tablet Place 1 tablet (0.4 mg total) under the tongue every 5 (five) minutes x 3 doses as needed for chest pain. 01/12/15  Yes Brett Canales, PA-C   omeprazole (PRILOSEC) 20 MG capsule Take 40 mg by mouth daily.    Yes Historical Provider, MD  polyethylene glycol (MIRALAX / GLYCOLAX) packet Take 17 g by mouth daily.    Yes Historical Provider, MD  PRESCRIPTION MEDICATION at bedtime. Cpap with oxygen 2%   Yes Historical Provider, MD  sodium chloride (OCEAN) 0.65 % SOLN nasal spray Place 1 spray into the nose daily.    Yes Historical Provider, MD  sucroferric oxyhydroxide (VELPHORO) 500 MG chewable tablet Chew 1,000 mg by mouth 3 (three) times daily with meals.   Yes Historical Provider, MD   No Known Allergies  FAMILY HISTORY:  Family History  Problem Relation Age of Onset  . Heart disease Father   . Kidney disease Father   . Hypertension Mother   . Cancer Brother   . Hypertension Brother    SOCIAL HISTORY: Social History   Social History  . Marital Status: Married    Spouse Name: N/A  . Number of Children: N/A  . Years of Education: N/A   Occupational History  . Retired    Social History Main Topics  . Smoking status: Former Smoker    Types: Cigars    Quit date: 06/08/1995  . Smokeless tobacco: None  . Alcohol Use: No  . Drug Use: No  . Sexual Activity: Not Asked   Other Topics Concern  . None   Social History Narrative   Lives in [place] with wife.   REVIEW OF SYSTEMS:  Unable to obtain as patient is currently intubated and sedated.  SUBJECTIVE:   VITAL SIGNS: Temp:  [98.3 F (36.8 C)-101.9 F (38.8 C)] 100.8 F (38.2 C) (10/28 1608) Pulse Rate:  [28-121] 93 (10/28 1745) Resp:  [16-32] 23 (10/28 1745) BP: (92-159)/(30-89) 128/50 mmHg (10/28 1745) SpO2:  [83 %-100 %] 100 % (10/28 1745) Weight:  [276 lb 14.4 oz (125.6 kg)-285 lb 4.4 oz (129.4 kg)] 285 lb 4.4 oz (129.4 kg) (10/28 1230) HEMODYNAMICS:   VENTILATOR SETTINGS:   INTAKE / OUTPUT:  Intake/Output Summary (Last 24 hours) at 07/06/15 1817 Last data filed at 07/06/15 1630  Gross per 24 hour  Intake 1164.2 ml  Output   3279 ml  Net  -2114.8 ml    PHYSICAL EXAMINATION: General:  Obtunded post sedation. No distress. Laying fully recumbent in bed. Integument:  Warm & dry. No rash on exposed skin. Left internal jugular venous catheter in place. Lymphatics:  No appreciated cervical or supraclavicular lymphadenoapthy. HEENT:  Endotracheal tube in place. Cloudy right sclera. Cardiovascular:  Regular rate. No edema. No appreciable JVD given body habitus.  Pulmonary:  Coarse breath sounds bilaterally. Symmetric chest wall rise on ventilator. Abdomen: Soft. Normal bowel sounds. Protuberant. Musculoskeletal:  Normal bulk and  tone. No joint deformity or effusion appreciated. Neurological:  Sedated post intubation. At the end of my exam patient was attempting to spontaneously move all 4 extremities. Left pupil round and pinpoint. Psychiatric: Unable to assess given intubation and sedation.  LABS:  CBC  Recent Labs Lab 07/04/15 1610 07/04/15 1612 07/05/15 0430 07/06/15 0410  WBC 14.4*  --  13.1* 7.8  HGB 9.1* 9.5* 8.6* 8.1*  HCT 26.6* 28.0* 26.4* 24.8*  PLT 94*  --  105* 118*   Coag's  Recent Labs Lab 07/02/2015 0645 07/07/2015 1350  APTT 32 34  INR 1.10 1.44   BMET  Recent Labs Lab 07/04/15 1610 07/04/15 1612 07/05/15 0430 07/06/15 0410  NA 139 137 135  135 140  K 5.4* 5.2* 5.5*  5.4* 4.8  CL 103 101 98*  99* 98*  CO2 23  --  25  26 28   BUN 43* 40* 52*  50* 32*  CREATININE 7.77*  7.42* 7.70* 9.08*  8.73* 6.67*  GLUCOSE 148* 147* 159*  156* 141*   Electrolytes  Recent Labs Lab 07/04/15 0504 07/04/15 1610 07/05/15 0430 07/06/15 0410  CALCIUM 9.0 9.4 9.8  9.7 10.0  MG 2.1 1.9  --  1.8  PHOS  --  5.8* 6.8* 6.0*   Sepsis Markers  Recent Labs Lab 07/06/15 1430  LATICACIDVEN 1.2   ABG  Recent Labs Lab 07/04/2015 2316 07/06/15 1707 07/06/15 1812  PHART 7.317* 7.447 7.411  PCO2ART 40.5 39.0 42.4  PO2ART 89.0 32.0* 249.0*   Liver Enzymes  Recent Labs Lab 07/04/15 1610  07/05/15 0430 07/06/15 0410  ALBUMIN 2.7* 2.6* 2.3*   Cardiac Enzymes No results for input(s): TROPONINI, PROBNP in the last 168 hours. Glucose  Recent Labs Lab 07/05/15 1933 07/05/15 2321 07/06/15 0419 07/06/15 0751 07/06/15 1253 07/06/15 1606  GLUCAP 122* 113* 130* 133* 156* 132*    Imaging Dg Abd 1 View  07/06/2015  CLINICAL DATA:  Evaluate NG tube placement EXAM: ABDOMEN - 1 VIEW COMPARISON:  None. FINDINGS: The feeding tube tip appears to be in the projection of the proximal duodenum. Dilated loops of bowel noted within the upper abdomen. IMPRESSION: Tip of feeding tube is in the projection of the expected location of the proximal duodenum. Electronically Signed   By: Kerby Moors M.D.   On: 07/06/2015 15:15   Ct Head Wo Contrast  07/06/2015  CLINICAL DATA:  74 year old male with worsening confusion. EXAM: CT HEAD WITHOUT CONTRAST TECHNIQUE: Contiguous axial images were obtained from the base of the skull through the vertex without intravenous contrast. COMPARISON:  Head CT 01/2015. FINDINGS: Small sclerotic right globe. Patchy and confluent areas of decreased attenuation are noted throughout the deep and periventricular white matter of the cerebral hemispheres bilaterally, compatible with chronic microvascular ischemic disease. No acute intracranial abnormalities. Specifically, no evidence of acute intracranial hemorrhage, no definite findings of acute/subacute cerebral ischemia, no mass, mass effect, hydrocephalus or abnormal intra or extra-axial fluid collections. Visualized paranasal sinuses and mastoids are well pneumatized. No acute displaced skull fractures are identified. IMPRESSION: 1. No acute intracranial abnormalities. 2. Chronic microvascular ischemic changes in cerebral white matter as above. Electronically Signed   By: Vinnie Langton M.D.   On: 07/06/2015 02:07   Ir Fluoro Guide Cv Line Left  07/06/2015  CLINICAL DATA:  Status post CABG. A right jugular sheath  niece to be removed due to fever and additional central venous access has been requested. EXAM: NON-TUNNELED CENTRAL VENOUS CATHETER PLACEMENT WITH ULTRASOUND AND FLUOROSCOPIC GUIDANCE  FLUOROSCOPY TIME:  24 seconds. PROCEDURE: The procedure, risks, benefits, and alternatives were explained to the patient's wife. Questions regarding the procedure were encouraged and answered. The patient's wife understands and consents to the procedure. A time-out was performed prior to the procedure. The left neck and chest were prepped with chlorhexidine in a sterile fashion, and a sterile drape was applied covering the operative field. Maximum barrier sterile technique with sterile gowns and gloves were used for the procedure. Local anesthesia was provided with 1% lidocaine. Ultrasound was used to confirm patency of the left internal jugular vein. After creating a small venotomy incision, a 21 gauge needle was advanced into the left internal jugular vein under direct, real-time ultrasound guidance. Ultrasound image documentation was performed. After securing guidewire access, a peel-away sheath was placed over a guide wire. Utilizing guide wire measurement, a 6 [place], triple-lumen Power Line non tunneled catheter was cut to 26 cm. The catheter was then placed through the sheath and the sheath removed. Final catheter positioning was confirmed and documented with a fluoroscopic spot image. The catheter was aspirated and flushed with saline. The catheter exit site was secured with 0-Prolene retention sutures. COMPLICATIONS: None.  No pneumothorax. FINDINGS: After catheter placement, the tip lies at the cavoatrial junction. The catheter aspirates normally and is ready for immediate use. IMPRESSION: Placement of non-tunneled central venous catheter via the left internal jugular vein. The catheter tip lies at the cavoatrial junction. The catheter is ready for immediate use. Electronically Signed   By: Aletta Edouard M.D.   On:  07/06/2015 13:25   Ir US Guide Vasc Access Left  07/06/2015  CLINICAL DATA:  Status post CABG. A right jugular sheath niece to be removed due to fever and additional central venous access has been requested. EXAM: NON-TUNNELED CENTRAL VENOUS CATHETER PLACEMENT WITH ULTRASOUND AND FLUOROSCOPIC GUIDANCE FLUOROSCOPY TIME:  24 seconds. PROCEDURE: The procedure, risks, benefits, and alternatives were explained to the patient's wife. Questions regarding the procedure were encouraged and answered. The patient's wife understands and consents to the procedure. A time-out was performed prior to the procedure. The left neck and chest were prepped with chlorhexidine in a sterile fashion, and a sterile drape was applied covering the operative field. Maximum barrier sterile technique with sterile gowns and gloves were used for the procedure. Local anesthesia was provided with 1% lidocaine. Ultrasound was used to confirm patency of the left internal jugular vein. After creating a small venotomy incision, a 21 gauge needle was advanced into the left internal jugular vein under direct, real-time ultrasound guidance. Ultrasound image documentation was performed. After securing guidewire access, a peel-away sheath was placed over a guide wire. Utilizing guide wire measurement, a 6 [place], triple-lumen Power Line non tunneled catheter was cut to 26 cm. The catheter was then placed through the sheath and the sheath removed. Final catheter positioning was confirmed and documented with a fluoroscopic spot image. The catheter was aspirated and flushed with saline. The catheter exit site was secured with 0-Prolene retention sutures. COMPLICATIONS: None.  No pneumothorax. FINDINGS: After catheter placement, the tip lies at the cavoatrial junction. The catheter aspirates normally and is ready for immediate use. IMPRESSION: Placement of non-tunneled central venous catheter via the left internal jugular vein. The catheter tip lies at the  cavoatrial junction. The catheter is ready for immediate use. Electronically Signed   By: Aletta Edouard M.D.   On: 07/06/2015 13:25   Dg Chest Port 1 View  07/06/2015  CLINICAL DATA:  Intubation, respiratory  arrest following dialysis EXAM: PORTABLE CHEST 1 VIEW COMPARISON:  07/06/2015 FINDINGS: Endotracheal tube terminates 6 mm above the carina. Mild elevation of the left hemidiaphragm. Blunting of the left costophrenic angle, raise the possibility small left pleural effusion. Cardiomegaly with pulmonary vascular congestion. Possible mild interstitial edema. No pneumothorax. Median sternotomy. Left subclavian pacemaker. Left IJ venous catheter terminates in the lower SVC. IMPRESSION: Endotracheal tube terminates 6 cm above the carina. Cardiomegaly with mild interstitial edema and possible small left pleural effusion. Left IJ venous catheter terminates in the lower SVC. Electronically Signed   By: Julian Hy M.D.   On: 07/06/2015 17:42   Dg Chest Port 1 View  07/06/2015  CLINICAL DATA:  Status post CABG on July 03, 2015, dialysis dependent renal failure, pacemaker. EXAM: PORTABLE CHEST 1 VIEW COMPARISON:  Portable chest x-rays of October 26 and 03/28/2015 FINDINGS: The lungs are slightly better inflated today. The interstitial markings remain increased. There is left basilar atelectasis. A there is a small left pleural effusion. The cardiac silhouette is enlarged. The central pulmonary vascularity is more prominent today. The right internal jugular Cordis sheath tip projects over the proximal SVC. There are 6 intact sternal wires visible on today's study. The permanent pacemaker is in reasonable position. IMPRESSION: Increased prominence of the pulmonary vascularity and pulmonary interstitium since yesterday's study is consistent with slight worsening of CHF. There is persistent left lower lobe atelectasis and small left pleural effusion. Electronically Signed   By: David  Martinique M.D.   On:  07/06/2015 07:31     ASSESSMENT / PLAN:  PULMONARY OETT 10/25>> 10/26, 10/28>>> A: Acute Hypoxic Respiratory Failure HCAP H/O Tobacco Use Remotely H/O OSA - reportedly on CPAP therapy Questionable H/O Sarcoidosis  P:   See ID Section Vent Bundle Wean FiO2 for Sat >94% Awaiting post intubation ABG  CARDIOVASCULAR CVL L IJ 10/28>>> A:  Atrial Fibrillation CAD - S/P CABG Complete Heart Block - S/P Pacemaker 01/2015 H/O HTN  P:  Monitor in Telemetry Amiodarone gtt  RENAL A:   ESRD on HD  P:   Monitoring electrolytes daily Trending BUN/Creatinine Nephrology following & guiding dialysis  GASTROINTESTINAL A:   H/O GERD  P:   Protonix via tube Initiating tube feedings  HEMATOLOGIC A:   Anemia of Chronic Disease - No signs of active bleeding. Thrombocytopenia - Improving.  P:  Daily CBC to trend Hgb & Platelets SCDs Switch from Lovenox to Heparin Colp q8hr given ESRD  INFECTIOUS A:   HCAP  P:  Procalcitonin Algorithm Monitor leukocyte count  Resp Ctx (10/28)>>> Blood Ctx (10/28)>>> Urine Ctx (10/28)>>>  Abx:  Vancomycin, start date 10/25>>> Tressie Ellis, start date 10/28>>>  ENDOCRINE A:   H/O DM   P:   Continuing accuchecks q4hr Low dose SSI coverage  NEUROLOGIC A:   Altered mental status/delirium  P:   RASS goal: 0 to -1 Morphine IV when necessary Propofol drip Discontinuing Versed & Precedex from [facility]   FAMILY  - Updates: Dr. Servando Snare updated the patient's family in the waiting room this evening. Wife was not present.  - Inter-disciplinary family meet or Palliative Care meeting due by:  11/04   TODAY'S SUMMARY: 74 year old Caucasian male with known history of coronary artery disease, OSA, & end-stage renal disease on hemodialysis. Patient underwent coronary artery bypass graft on 10/25. Successfully extubated on 10/26. Patient had progressively worsening mentation as well as progressively worsening hypoxic respiratory failure after  extubation. He was emergently intubated by anesthesia this evening. Cultures have been sent  from blood, urine, and tracheal aspirate. Bedside flexible bronchoscopy shows no mucus plugging and only thick secretions. Patient has been on vancomycin and Tressie Ellis was added to his antibiotic regimen by me this evening. He is being continued on amiodarone given his atrial fibrillation this morning. Initiating propofol for sedation & discontinuing Versed as well as Precedex from his MAR.  I spent a total of 41 minutes of critical care time caring for the patient, discussing the plan of care with Dr. Servando Snare, & reviewing the patient's electronic medical record independent of procedures this evening.  Sonia Baller Ashok Cordia, M.D. [facility] Pager:  [phone] After 3pm or if no response, call [phone] 07/06/2015, 6:17 PM

## 2015-07-06 NOTE — Progress Notes (Signed)
eLink Physician-Brief Progress Note Patient Name: Antonio Page DOB: Jan 18, 1941 MRN: [id number]   Date of Service  07/06/2015  HPI/Events of Note  Patient s/p CABG now with suspected PNA also with ESRD.  Received IHD today with removal of 3 liters.  Now with BP of 113/29 (43).    eICU Interventions  Plan: 1. Check CVP 2. 500 cc ns bolus for BP support 3. Restart NEO gtt as needed for BP support 4. Continue to monitor via [facility]     Intervention Category Intermediate Interventions: Hypotension - evaluation and management  Antonio Page 07/06/2015, 10:37 PM

## 2015-07-06 NOTE — Progress Notes (Addendum)
ANTIBIOTIC CONSULT NOTE - INITIAL  Pharmacy Consult for Ceftazidine Indication: pneumonia  No Known Allergies  Patient Measurements: Height: 5\' 11"  (180.3 cm) Weight: 285 lb 4.4 oz (129.4 kg) IBW/kg (Calculated) : 75.3   Vital Signs: Temp: 100.8 F (38.2 C) (10/28 1608) Temp Source: Axillary (10/28 1608) BP: 128/50 mmHg (10/28 1745) Pulse Rate: 93 (10/28 1745)  Labs:  Recent Labs  07/04/15 1610 07/04/15 1612 07/05/15 0430 07/06/15 0410  WBC 14.4*  --  13.1* 7.8  HGB 9.1* 9.5* 8.6* 8.1*  PLT 94*  --  105* 118*  CREATININE 7.77*  7.42* 7.70* 9.08*  8.73* 6.67*  *ESRD*  Microbiology: Cx data:  Blood: 10/28 (drawn on dialysis): px Urine: 10/28: px Resp: 10/28: px  Anti-infective's:  Ceftazidine: 10/28 << Vancomycin: 10/28 <<   Assessment: 73yoM who is s/p CABG x 2 that developed respiratory failure requiring intubation on 10/28. Normal outpt IHD of MWF but has had HD Monday, Wed, Thurs and Friday of this week .  Goal of Therapy:  treatment of infection   Plan:  1. Ceftazidine 2 gm post HD x 1 and after each IHD going forward 2. F/u IHD schedule and schedule subsequent doses accordingly 3. Following daily  Vincenza Hews, PharmD, BCPS 07/06/2015, 6:25 PM Pager: [phone]

## 2015-07-06 NOTE — Progress Notes (Signed)
Called into room by Dialysis RN, patient eyes rolled back and stopped breathing.  O2 sats 80%.  Immediately started bagging patient and sats up to 98%.  Dr. Servando Snare called and order received to get Anesthesia to intubate and to consult CCM.  Anesthesia into intubate patient.  Dr. Servando Snare also in room.

## 2015-07-06 NOTE — Progress Notes (Signed)
Rt. IJ Central line Discontinued by Maia Petties, RN.  Pt. Laid flat during procedure with HOB to elevate back up after about 20 min.  Tolerated removal of Central line.

## 2015-07-06 NOTE — Progress Notes (Signed)
  Amiodarone Drug - Drug Interaction Consult Note  Recommendations: Patient on statin (atorvastatin) and beta blocker (lopressor). Not contraindicated with amiodarone therapy. Monitor muscle pain/weakness, bradycardia.  Amiodarone is metabolized by the cytochrome P450 system and therefore has the potential to cause many drug interactions. Amiodarone has an average plasma half-life of 50 days (range 20 to 100 days).   There is potential for drug interactions to occur several weeks or months after stopping treatment and the onset of drug interactions may be slow after initiating amiodarone.   [x]  Statins: Increased risk of myopathy. Simvastatin- restrict dose to 20mg  daily. Other statins: counsel patients to report any muscle pain or weakness immediately. Atorvastatin 80mg  daily  []  Anticoagulants: Amiodarone can increase anticoagulant effect. Consider warfarin dose reduction. Patients should be monitored closely and the dose of anticoagulant altered accordingly, remembering that amiodarone levels take several weeks to stabilize. Lovenox for vte ppx  []  Antiepileptics: Amiodarone can increase plasma concentration of phenytoin, the dose should be reduced. Note that small changes in phenytoin dose can result in large changes in levels. Monitor patient and counsel on signs of toxicity.  [x]  Beta blockers: increased risk of bradycardia, AV block and myocardial depression. Sotalol - avoid concomitant use. Lopressor 12.5mg  PO BID  []   Calcium channel blockers (diltiazem and verapamil): increased risk of bradycardia, AV block and myocardial depression.  []   Cyclosporine: Amiodarone increases levels of cyclosporine. Reduced dose of cyclosporine is recommended.  []  Digoxin dose should be halved when amiodarone is started.  []  Diuretics: increased risk of cardiotoxicity if hypokalemia occurs.  []  Oral hypoglycemic agents (glyburide, glipizide, glimepiride): increased risk of hypoglycemia. Patient's  glucose levels should be monitored closely when initiating amiodarone therapy.   []  Drugs that prolong the QT interval:  Torsades de pointes risk may be increased with concurrent use - avoid if possible.  Monitor QTc, also keep magnesium/potassium WNL if concurrent therapy can't be avoided. Marland Kitchen Antibiotics: e.g. fluoroquinolones, erythromycin. . Antiarrhythmics: e.g. quinidine, procainamide, disopyramide, sotalol. . Antipsychotics: e.g. phenothiazines, haloperidol.  . Lithium, tricyclic antidepressants, and methadone.  Thank You,   Elicia Lamp, PharmD Clinical Pharmacist Pager [phone] 07/06/2015 9:10 AM

## 2015-07-06 NOTE — Procedures (Signed)
I have personally attended this patient's dialysis session.   UF goal 3 liters Cannulated with 2 sharps (rather than using buttonholes) BFR 400 no access pressure issues 2K bath BP 654'Y systolic  Jamal Maes, MD [facility] [phone] Pager 07/06/2015, 1:15 PM

## 2015-07-06 NOTE — Progress Notes (Signed)
RT Note: Post intubation ABG was drawn by Respiratory Therapy via arterial line.

## 2015-07-06 NOTE — Progress Notes (Signed)
RT Note: RT was called to assess patient by RN because patient was desaturating and had increased WOB while receiving dialysis. Upon arriving to patients room he was saturating 87%. Rt found patient with a nasal cannula in his nose that was hooked to a O2 tank, but the tank was not on so no O2 was being administered to the patient. Patient did have some increased WOB but did not appear to be in acute distress. He was placed on his home Cpap machine, on his home settings and 3lpm was bled in to administer O2 to the patient. Patient is tolerating his CPAP very well, and his O2 saturation has come up to 100%. Patient's WOB has also improved with him being on Cpap. BBS-diminished on the left and crackles/coarse breath sounds heard on the right. Patient was also laying flat upon initially arriving but the RN has since raised his [facility] which has also helped. Patient is stable and in no apparent distress currently. RT will continue to monitor.

## 2015-07-06 NOTE — Sedation Documentation (Signed)
Procedure finished. Tolerated well.

## 2015-07-06 NOTE — Progress Notes (Signed)
[facility] Progress Note   Subjective:    S/p 2V CABG (LIMA to LAD, SVG to distal cx) 10/25 Had dialysis again yesterday for hyperkalemia - achieved adequate BFR's - but low BP's precluded fluid removal. AFib earlier - paced rhythm 90's now and cardiology is seeing, and neuro is seeing d/t progressive decline in mental status Febrile to 100.2 this morning and has not yet been cultured (ordering blood and urine cultures) Needs HD again today for volume  Physical Exam BP 137/79 mmHg  Pulse 111  Temp(Src) 100.2 F (37.9 C) (Oral)  Resp 18  Ht 5\' 11"  (1.803 m)  Wt 125.8 kg (277 lb 5.4 oz)  BMI 38.70 kg/m2  SpO2 99%  EDW 121.5 Pre/post HD weights 10/26  131.>128.5 Pre/post HD weights 10/27  126.1->125.6 (hypotension during treatment)  General:  Restless, somewhat agitated Does not open eyes in response to questions Right IJ line in place Heart: S1,S2, No S3. Regular.  Median sternotomy incision with dressing in place Pacemaker in place left side Lungs: Coarse crackles Abdomen: Protuberant abdomen. Active BS. Extremities: 1+ woody LE edema LE's. Right LE incision clean and dry. Bruising R prox thigh Dialysis Access: RUA AVF + bruit   Additional Objective  Recent Labs Lab 07/04/15 1610 07/04/15 1612 07/05/15 0430 07/06/15 0410  NA 139 137 135  135 140  K 5.4* 5.2* 5.5*  5.4* 4.8  CL 103 101 98*  99* 98*  CO2 23  --  25  26 28   GLUCOSE 148* 147* 159*  156* 141*  BUN 43* 40* 52*  50* 32*  CREATININE 7.77*  7.42* 7.70* 9.08*  8.73* 6.67*  CALCIUM 9.4  --  9.8  9.7 10.0  PHOS 5.8*  --  6.8* 6.0*   Results for Antonio, Page (MRN [id number]) as of 07/06/2015 09:22  Ref. Range 07/06/2015 04:10  Albumin Latest Ref Range: 3.5-5.0 g/dL 2.3 (L)    Recent Labs Lab 06/10/2015 2015  07/04/15 0504 07/04/15 1610 07/04/15 1612 07/05/15 0430 07/06/15 0410  WBC 15.8*  --  16.4* 14.4*  --  13.1* 7.8  HGB 9.4*  < > 8.9* 9.1* 9.5* 8.6* 8.1*  HCT  28.9*  < > 27.8* 26.6* 28.0* 26.4* 24.8*  MCV 97.0  --  98.6 96.7  --  97.4 98.0  PLT 113*  --  111* 94*  --  105* 118*  < > = values in this interval not displayed.   Recent Labs Lab 07/05/15 1639 07/05/15 1933 07/05/15 2321 07/06/15 0419 07/06/15 0751  GLUCAP 110* 122* 113* 130* 133*   Medications: Infusions: . sodium chloride 20 mL/hr at 07/04/15 0400  . sodium chloride 250 mL (07/05/15 0400)  . sodium chloride 10 mL/hr at 07/05/15 0400  . dexmedetomidine Stopped (06/18/2015 1600)  . insulin (NOVOLIN-R) infusion Stopped (07/04/15 1400)  . lactated ringers    . lactated ringers    . nitroGLYCERIN Stopped (07/04/15 1700)  . phenylephrine (NEO-SYNEPHRINE) Adult infusion Stopped (07/04/15 1200)   Scheduled: . acetaminophen  1,000 mg Oral 4 times per day   Or  . acetaminophen (TYLENOL) oral liquid 160 mg/5 mL  1,000 mg Per Tube 4 times per day  . allopurinol  150 mg Oral Daily  . amiodarone  150 mg Intravenous Once  . antiseptic oral rinse  7 mL Mouth Rinse q12n4p  . aspirin EC  325 mg Oral Daily   Or  . aspirin  324 mg Per Tube Daily  . atorvastatin  80 mg Oral  q1800  . bisacodyl  10 mg Oral Daily   Or  . bisacodyl  10 mg Rectal Daily  . chlorhexidine  15 mL Mouth Rinse BID  . Chlorhexidine Gluconate Cloth  6 each Topical Daily  . cinacalcet  30 mg Oral Q supper  . darbepoetin (ARANESP) injection - DIALYSIS  60 mcg Intravenous Q Mon-HD  . docusate sodium  200 mg Oral Daily  . doxercalciferol  5 mcg Intravenous Q M,W,F-HD  . enoxaparin (LOVENOX) injection  30 mg Subcutaneous Q24H  . insulin aspart  0-24 Units Subcutaneous 6 times per day  . insulin regular  0-10 Units Intravenous TID WC  . loratadine  10 mg Oral Daily  . magnesium sulfate  4 g Intravenous Once  . metoprolol tartrate  12.5 mg Oral BID   Or  . metoprolol tartrate  12.5 mg Per Tube BID  . multivitamin  1 tablet Oral QHS  . mupirocin ointment  1 application Nasal BID  . pantoprazole  40 mg Oral Daily   . sodium chloride  3 mL Intravenous Q12H  . sucroferric oxyhydroxide  1,000 mg Oral TID WC   Dialysis Orders: Center: [facility] on MWF . EDW 121.5 kg  HD Bath 2.0 K 2.0 Ca  Time 4 hours 15 minutes  Heparin 4000 Units per tx.  Access RUA AVF  BFR 500 DFR 800 manual  Mircera 50 mcg IV q 2 weeks (last dose 06/20/2015) Hectoral 5 mcg IV q MWF  Assessment/Recommendations: 1. CAD -  S/P 2 vessel CABG LIMA to LAD, SVG to distal cx 10/25.  2. PAF - now on amio and cardiology evaluating  3. Encephalopathy - worsening MS. Neuro evaluating.  CT brain showed no acute intracranial abnormalities, chronic microvascular ischemic changes in cerebral white matter 4. Fever - CXR just looks wet. Source not clear. Culture. Empiric vanco (since lines) Dr. Servando Snare plans to change out central line. 5. H/O CHB w/pacemaker in place 6. ESRD - MWF. Has had HD Monday, Wed, Thurs and will have again today. Unable to pull volume yesterday due to low BP's which look some better today. Will try for 3 liter goal. 7. Anemia - Started darbe 60/week on 10/24. (Rec'd last outpt dose of Mircera 06/20/15). Continue same. Hb down to 8.1. 8. Metabolic bone disease - Resume sensipar and binders when taking po. Corrected calcium currently around 11.5. Stop hectoro for now.  9. Nutrition - Carb mod/renal diet when taking po 10. DM -  Per primary. 11. HLD - on atorvastatin  Jamal Maes, MD [place] Pager 07/06/2015, 9:25 AM

## 2015-07-06 NOTE — Procedures (Signed)
Flexible Bronchoscopy Procedure Note  Pre-Procedure Diagnoses: 1.  Acute Hypoxic Respiratory Failure  Consent:  Procedure performed emergently without consent given acute hypoxic respiratory failure persisting even after endotracheal intubation by anesthesia emergently.  Medications Administered:  See nursing records/MAR.  Pre-Procedure Physical Exam: General:  Sedated. No distress. Laying fully recumbent in bed. HEENT:  Dry mucus membranes. Endotracheal tube in place. Cardiovascular:  Regular rate. No edema.  Pulmonary:  Symmetric chest wall rise on ventilator. Coarse breath sounds bilaterally. Abdomen:  Soft. Protuberant. Normal bowel sounds. Musculoskeletal:  Normal bulk and tone. No joint deformity appreciated. Neurological:  Currently sedated after medications for intubation.  Description of Procedure: Procedure was performed in the surgical intensive care unit in the patient's room. Flexible bronchoscope was inserted through the patient's endotracheal tube into his proximal airways with ease. A minimal amount of thick, cloudy secretions were suctioned from the lumen of the endotracheal tube. Small amount of residual secretions were suctioned from the patient's trachea. Bilateral airways were examined revealing normal mucosa without erythema or edema. Minimal residual secretions bilaterally were suctioned. Endotracheal tube positioning was confirmed with direct visualization of the carina. Remaining secretions were suctioned from the lumen of the endotracheal tube on withdrawal of the bronchoscope.  Blood Loss:  None.  Complications:  None.  Post Procedure:  Patient remains intubated in the intensive care unit in critical condition.

## 2015-07-06 NOTE — Progress Notes (Signed)
ANTIBIOTIC CONSULT NOTE - INITIAL  Pharmacy Consult for vancomycin Indication: fever post-CABG  No Known Allergies  Patient Measurements: Height: 5\' 11"  (180.3 cm) Weight: 277 lb 5.4 oz (125.8 kg) IBW/kg (Calculated) : 75.3  Vital Signs: Temp: 100.2 F (37.9 C) (10/28 0754) Temp Source: Oral (10/28 0754) BP: 137/79 mmHg (10/28 0800) Pulse Rate: 111 (10/28 0800) Intake/Output from previous day: 10/27 0701 - 10/28 0700 In: 660 [P.O.:180; I.V.:480] Out: 299 [Chest Tube:20] Intake/Output from this shift:    Labs:  Recent Labs  07/04/15 1610 07/04/15 1612 07/05/15 0430 07/06/15 0410  WBC 14.4*  --  13.1* 7.8  HGB 9.1* 9.5* 8.6* 8.1*  PLT 94*  --  105* 118*  CREATININE 7.77*  7.42* 7.70* 9.08*  8.73* 6.67*   Estimated Creatinine Clearance: 12.9 mL/min (by C-G formula based on Cr of 6.67). No results for input(s): VANCOTROUGH, VANCOPEAK, VANCORANDOM, GENTTROUGH, GENTPEAK, GENTRANDOM, TOBRATROUGH, TOBRAPEAK, TOBRARND, AMIKACINPEAK, AMIKACINTROU, AMIKACIN in the last 72 hours.   Microbiology: Recent Results (from the past 720 hour(s))  Surgical pcr screen     Status: Abnormal   Collection Time: 07/02/15 10:12 PM  Result Value Ref Range Status   MRSA, PCR NEGATIVE NEGATIVE Final   Staphylococcus aureus POSITIVE (A) NEGATIVE Final    Comment:        The Xpert SA Assay (FDA approved for NASAL specimens in patients over 76 years of age), is one component of a comprehensive surveillance program.  Test performance has been validated by [facility] for patients greater than or equal to 62 year old. It is not intended to diagnose infection nor to guide or monitor treatment.     Medical History: Past Medical History  Diagnosis Date  . Hypertension   . GERD (gastroesophageal reflux disease)   . Arthritis   . Glaucoma   . Blind right eye   . Coronary artery disease     a. s/p PCI/stent-> RCA;  b. 02/2015 MV: infarct/no ischemia;  c. 06/2015 Cath: LM 80,LCX 95ost,  29m, RCA patent stent.  . Hypoxia 01/11/2015  . CHB (complete heart block) ([place]) 01/08/15    PPM placed MDT  . ESRD needing dialysis [facility])     MWF at [facility] (06/29/2015)  . Kidney stones   . Type II diabetes mellitus ([place])   . OSA on CPAP   . Presence of permanent cardiac pacemaker     Assessment: 37 yom ESRD- HD MWF now s/p CABG on 10/25. Now with fever, Tmax/24h 100.4, wbc down to wnl. No current abx, and pharmacy consulted to dose vancomycin for fever post-CABG. Cultures ordered. To get HD again today for volume per renal note.  10/28 vanc>>  10/28 BCx2>> 10/28 UC>> MRSA pcr +  Goal of Therapy:  Target pre-HD level 15-25 mcg/ml Target post-HD level 5-15 mcg/ml  Plan:  Vanc 2g IV load; then Vanc 1g IV qHD Monitor clinical progress, c/s, renal function, abx plan/LOT Vanc levels as indicated F/u HD schedule inpatient - may need additional doses if more extra HD sessions  Elicia Lamp, PharmD Clinical Pharmacist Pager [phone] 07/06/2015 10:24 AM

## 2015-07-06 NOTE — Progress Notes (Signed)
Pt is on home CPAP w/nasal mask. Pt has been very sleepy today. Pt is using nasal mask. Pt is sleeping with mouth open and snoring with mask on. I advised patients family he needed and full face mask or chin strap with nasal mask. They said he had tried FFM and did not tolerate. He has chin strap at home. RT asked them to bring this in tomorrow night. 2L oxygen bled in. Humidity chamber filled. Pt is on a auto titration setting. RT/RN will monitor.

## 2015-07-06 NOTE — Progress Notes (Signed)
Initial Nutrition Assessment  DOCUMENTATION CODES:   Obesity unspecified  INTERVENTION:  Initiate Nepro @ 20 ml/hr via Cortak NGT and increase by 10 ml every 4 hours to goal rate of 55 ml/hr.   Tube feeding regimen provides 2376 kcal (100% of needs), 107 grams of protein, and 964 ml of H2O.   NUTRITION DIAGNOSIS:   Inadequate oral intake related to lethargy/confusion as evidenced by NPO status.   GOAL:   Patient will meet greater than or equal to 90% of their needs   MONITOR:   TF tolerance, Labs, Weight trends, Skin, I & O's  REASON FOR ASSESSMENT:   Consult Enteral/tube feeding initiation and management  ASSESSMENT:   74 y.o. 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 Cathed on 10/20 as outpatient, found to have significant calcified LM dx. Now 3 days s/p CABG  Mental status has deteriorated since surgery per MD note. He has been NPO since 10/25. RD consulted for enteral/tube feeding initiation and management. Cortrak feeding tube in place via L nare; awaiting confirmation of placement. On hemodialysis at time of visit. EDW per MD note is 121.5 kg. Pt appears well nourished.   Labs: low chloride, low GFR, high creatinine, low hemoglobin  Diet Order:  Diet NPO time specified  Skin:  Wound (see comment) (closed incision on chest and right leg)  Last BM:  10/26  Height:   Ht Readings from Last 1 Encounters:  07/04/2015 5\' 11"  (1.803 m)    Weight:   Wt Readings from Last 1 Encounters:  07/06/15 285 lb 4.4 oz (129.4 kg)    Ideal Body Weight:  78.2 kg  BMI:  Body mass index is 39.81 kg/(m^2).  Estimated Nutritional Needs:   Kcal:  0511-0211  Protein:  110-120 grams  Fluid:  per MD  EDUCATION NEEDS:   No education needs identified at this time  [place],  LDN Inpatient Clinical Dietitian Pager: [phone] After Hours Pager: [phone]

## 2015-07-06 NOTE — Progress Notes (Signed)
PT Cancellation Note  Patient Details Name: JAEVIAN SHEAN MRN: [id number] DOB: [birth year]   Cancelled Treatment:    Reason Eval/Treat Not Completed: Medical issues which prohibited therapy;Patient at procedure or test/unavailable pt off floor to get catheter placed for urgent dialysis with plan to start dialysis upon return to room. Will follow up next available time.   Marguarite Arbour A Detta Mellin 07/06/2015, 10:47 AM Wray Kearns, PT, DPT [phone]

## 2015-07-06 NOTE — Progress Notes (Signed)
TCTS BRIEF SICU PROGRESS NOTE  3 Days Post-Op  S/P Procedure(s) (LRB): CORONARY ARTERY BYPASS GRAFTING (CABG) times two using left internal mammary artery and right saphenous leg vein.  [place]ima to LAD and SVG to distal circumflex. (N/A) TRANSESOPHAGEAL ECHOCARDIOGRAM (TEE) (N/A)   Events of the day noted Patient stable since reintubation earlier this afternoon NSR w/ stable BP ABG normalized w/ O2 sats 100% and PaO2 249 on vent  Plan: Continue current plan  Rexene Alberts, MD 07/06/2015 7:08 PM

## 2015-07-06 NOTE — Procedures (Signed)
Interventional Radiology Procedure Note  Procedure:  Left IJ CVC  Complications:  None  Estimated Blood Loss:  < 10 mL  6 Fr, 26 cm triple lumen Power Line placed via left IJ vein.  Tip in lower SVC.  OK to use.  Venetia Night. Kathlene Cote, M.D Pager:  [phone]

## 2015-07-06 NOTE — Procedures (Signed)
4 hour hemodialysis treatment completed without difficulty using the Right upper arm AVF. 3kg removed with treatment. Tolerated fluid removal well with SBP above 90 for the entire treatment.

## 2015-07-06 NOTE — Progress Notes (Addendum)
Patient Name: Antonio Page Date of Encounter: 07/06/2015  Primary Cardiologist: Dr. Gwenlyn Found   Principal Problem:   Unstable angina [facility]) Active Problems:   Morbid obesity (HCC)   OSA (obstructive sleep apnea)   Diabetes mellitus type 2, insulin dependent (HCC)   Uncontrolled hypertension   Coronary artery disease   End stage renal disease on dialysis (HCC)   Anemia in chronic kidney disease   Complete heart block, temp pacemaker, s/p PPM MDT dual chamber 5/4/1t6   CAD (coronary artery disease)   Chest pain    SUBJECTIVE  Per wife, patient's mental status was normal prior to surgery. He did wake and recognized family the day after surgery, but mental status has deteriorated since. Noticeable decline in mental status yesterday, no longer follows command.   CURRENT MEDS . acetaminophen  1,000 mg Oral 4 times per day   Or  . acetaminophen (TYLENOL) oral liquid 160 mg/5 mL  1,000 mg Per Tube 4 times per day  . allopurinol  150 mg Oral Daily  . amiodarone  150 mg Intravenous Once  . antiseptic oral rinse  7 mL Mouth Rinse q12n4p  . aspirin EC  325 mg Oral Daily   Or  . aspirin  324 mg Per Tube Daily  . atorvastatin  80 mg Oral q1800  . bisacodyl  10 mg Oral Daily   Or  . bisacodyl  10 mg Rectal Daily  . chlorhexidine  15 mL Mouth Rinse BID  . Chlorhexidine Gluconate Cloth  6 each Topical Daily  . cinacalcet  30 mg Oral Q supper  . darbepoetin (ARANESP) injection - DIALYSIS  60 mcg Intravenous Q Mon-HD  . docusate sodium  200 mg Oral Daily  . doxercalciferol  5 mcg Intravenous Q M,W,F-HD  . enoxaparin (LOVENOX) injection  30 mg Subcutaneous Q24H  . insulin aspart  0-24 Units Subcutaneous 6 times per day  . insulin regular  0-10 Units Intravenous TID WC  . loratadine  10 mg Oral Daily  . magnesium sulfate  4 g Intravenous Once  . metoprolol tartrate  12.5 mg Oral BID   Or  . metoprolol tartrate  12.5 mg Per Tube BID  . multivitamin  1 tablet Oral QHS  . mupirocin  ointment  1 application Nasal BID  . pantoprazole  40 mg Oral Daily  . sodium chloride  3 mL Intravenous Q12H  . sucroferric oxyhydroxide  1,000 mg Oral TID WC    OBJECTIVE  Filed Vitals:   07/06/15 0600 07/06/15 0700 07/06/15 0754 07/06/15 0800  BP: 141/55 114/38  137/79  Pulse: 104 111  111  Temp:   100.2 F (37.9 C)   TempSrc:   Oral   Resp: 26 28  18   Height:      Weight:      SpO2: 94% 99%  99%    Intake/Output Summary (Last 24 hours) at 07/06/15 0920 Last data filed at 07/06/15 0700  Gross per 24 hour  Intake    620 ml  Output    299 ml  Net    321 ml   Filed Weights   07/05/15 1351 07/05/15 1823 07/06/15 0400  Weight: 278 lb (126.1 kg) 276 lb 14.4 oz (125.6 kg) 277 lb 5.4 oz (125.8 kg)    PHYSICAL EXAM  General: Does not respond to command, occasional move hands. Eyes closed. Neuro: On [place]. Does not respond to command. Eyes closed.  HEENT:  Normal  Neck: Supple without bruits or JVD. R  IJ in place.  Lungs:  Resp regular and unlabored, CTA. Heart: Irregular. no s3, s4, or murmurs.  Abdomen: Soft, non-tender, non-distended, BS + x 4.  Extremities: No clubbing, cyanosis or edema. DP/PT/Radials 2+ and equal bilaterally.  Accessory Clinical Findings  CBC  Recent Labs  07/05/15 0430 07/06/15 0410  WBC 13.1* 7.8  HGB 8.6* 8.1*  HCT 26.4* 24.8*  MCV 97.4 98.0  PLT 105* 109*   Basic Metabolic Panel  Recent Labs  07/04/15 0504  07/04/15 1610  07/05/15 0430 07/06/15 0410  NA 134*  --  139  < > 135  135 140  K 6.7*  --  5.4*  < > 5.5*  5.4* 4.8  CL 102  --  103  < > 98*  99* 98*  CO2 19*  --  23  --  25  26 28   GLUCOSE 191*  --  148*  < > 159*  156* 141*  BUN 63*  --  43*  < > 52*  50* 32*  CREATININE 10.35*  --  7.77*  7.42*  < > 9.08*  8.73* 6.67*  CALCIUM 9.0  --  9.4  --  9.8  9.7 10.0  MG 2.1  --  1.9  --   --   --   PHOS  --   < > 5.8*  --  6.8* 6.0*  < > = values in this interval not displayed. Liver Function Tests  Recent  Labs  07/05/15 0430 07/06/15 0410  ALBUMIN 2.6* 2.3*    TELE Previously in NSR with 1st degree AV block. Went into new afib around 5AM this morning, HR initially jumped to 110s, now 70-80s.     ECG  No new EKG  Echocardiogram 01/08/2015  LV EF: 55% -  60%  ------------------------------------------------------------------- Indications:   Syncope 780.2.  ------------------------------------------------------------------- History:  PMH:  Coronary artery disease. Risk factors: Obstructive sleep apnea. End stage renal disease. Hypertension. Diabetes mellitus.  ------------------------------------------------------------------- Study Conclusions  - Left ventricle: The cavity size was normal. Wall thickness was increased in a pattern of mild LVH. Systolic function was normal. The estimated ejection fraction was in the range of 55% to 60%. Wall motion was normal; there were no regional wall motion abnormalities. - Mitral valve: Calcified annulus. There was mild regurgitation. - Left atrium: The atrium was moderately dilated.  Impressions:  - Incomplete study. Study was terminated secondary to periods of asystole.    Radiology/Studies  Ct Head Wo Contrast  07/06/2015  CLINICAL DATA:  74 year old male with worsening confusion. EXAM: CT HEAD WITHOUT CONTRAST TECHNIQUE: Contiguous axial images were obtained from the base of the skull through the vertex without intravenous contrast. COMPARISON:  Head CT 01/2015. FINDINGS: Small sclerotic right globe. Patchy and confluent areas of decreased attenuation are noted throughout the deep and periventricular white matter of the cerebral hemispheres bilaterally, compatible with chronic microvascular ischemic disease. No acute intracranial abnormalities. Specifically, no evidence of acute intracranial hemorrhage, no definite findings of acute/subacute cerebral ischemia, no mass, mass effect, hydrocephalus or abnormal intra or  extra-axial fluid collections. Visualized paranasal sinuses and mastoids are well pneumatized. No acute displaced skull fractures are identified. IMPRESSION: 1. No acute intracranial abnormalities. 2. Chronic microvascular ischemic changes in cerebral white matter as above. Electronically Signed   By: Vinnie Langton M.D.   On: 07/06/2015 02:07   Dg Chest Port 1 View  07/06/2015  CLINICAL DATA:  Status post CABG on July 03, 2015, dialysis dependent renal failure, pacemaker. EXAM:  PORTABLE CHEST 1 VIEW COMPARISON:  Portable chest x-rays of October 26 and 03/28/2015 FINDINGS: The lungs are slightly better inflated today. The interstitial markings remain increased. There is left basilar atelectasis. A there is a small left pleural effusion. The cardiac silhouette is enlarged. The central pulmonary vascularity is more prominent today. The right internal jugular Cordis sheath tip projects over the proximal SVC. There are 6 intact sternal wires visible on today's study. The permanent pacemaker is in reasonable position. IMPRESSION: Increased prominence of the pulmonary vascularity and pulmonary interstitium since yesterday's study is consistent with slight worsening of CHF. There is persistent left lower lobe atelectasis and small left pleural effusion. Electronically Signed   By: David  Martinique M.D.   On: 07/06/2015 07:31   Dg Chest Port 1 View  07/05/2015  CLINICAL DATA:  Chest tube in place EXAM: PORTABLE CHEST 1 VIEW COMPARISON:  Chest radiograph from one day prior. FINDINGS: Right internal jugular central venous sheath terminates in the upper third of the superior vena cava. Tool lead left subclavian ICD in median sternotomy wires are stable in configuration. Vascular stent overlies the right axilla. Stable cardiomediastinal silhouette with mild cardiomegaly. Left apical chest tube is stable. No pneumothorax. No right pleural effusion. Stable small left pleural effusion. Low lung volumes. Stable mild  pulmonary edema. Stable patchy left basilar lung consolidation. IMPRESSION: 1. No pneumothorax. 2. Stable small left pleural effusion. 3. Stable mild congestive heart failure. 4. Stable patchy consolidation at the left lung base, favor atelectasis. Electronically Signed   By: Ilona Sorrel M.D.   On: 07/05/2015 07:58   Dg Chest Port 1 View  07/04/2015  CLINICAL DATA:  Status post coronary artery bypass graft x2. EXAM: PORTABLE CHEST 1 VIEW COMPARISON:  July 03, 2015. FINDINGS: Stable cardiomegaly. Endotracheal and nasogastric tubes have been removed. Status post coronary artery bypass graft. Right internal jugular Swan-Ganz catheter is noted with tip in main pulmonary artery. Left-sided pacemaker is unchanged. Left-sided chest tube is noted without pneumothorax. Right lung is clear. Stable left basilar opacity is noted concerning for atelectasis with associated pleural effusion. IMPRESSION: Endotracheal and nasogastric tubes have been removed. Left-sided chest tube is noted without evidence of pneumothorax. Stable left basilar atelectasis with associated pleural effusion is noted. Electronically Signed   By: Marijo Conception, M.D.   On: 07/04/2015 08:01   Dg Chest Port 1 View  06/14/2015  CLINICAL DATA:  Hypoxia. Status post coronary artery bypass grafting EXAM: PORTABLE CHEST 1 VIEW COMPARISON:  July 02, 2015 FINDINGS: Endotracheal tube tip is 5.9 cm above the carina. Swan-Ganz catheter tip is in the main pulmonary outflow tract. There is a left chest tube present. Nasogastric tube tip and side port are below the diaphragm. There is a mediastinal drain. Pacemaker leads are attached to the right atrium and right ventricle. No pneumothorax. There is a left pleural effusion with left base atelectasis. The right lung is clear. Heart is prominent but stable. IMPRESSION: Tube and catheter positions as described without pneumothorax. Left effusion with left base atelectasis. Right lung clear. No change in  cardiac silhouette. Electronically Signed   By: Lowella Grip III M.D.   On: 07/02/2015 14:08   Dg Chest Port 1 View  07/02/2015  CLINICAL DATA:  Patient with headache.  Preoperative evaluation. EXAM: PORTABLE CHEST 1 VIEW COMPARISON:  Chest radiograph 01/11/2015 FINDINGS: Dual lead pacer apparatus overlies the left hemi thorax. Stable cardiomegaly. Persistent small left pleural effusion underlying heterogeneous pulmonary opacities. Stent material projects over right  subclavian location. No aggressive or acute appearing osseous lesions. IMPRESSION: Small left pleural effusion with underlying pulmonary consolidation, favored to represent atelectasis. Infection not excluded. Electronically Signed   By: Lovey Newcomer M.D.   On: 07/02/2015 20:36     ASSESSMENT AND PLAN  JEMARIO POITRAS is a 74 y.o. 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 Cathed on 10/20 as outpatient, found to have significant calcified LM dx, planning for CABG  1. Class III angina - cath 80% calcified distal LM, 95% ost LCx, 50% mid LCx. Plan for CABG - CABG 07/02/2015 [place] to LAD, SVG to distal LCx   2. Metabolic encephalopathy: neurology following, planning for MRI of brain to r/o stroke  3. Fever of unknown etiology  4. New paroxymal atrial fibrillation postop  - went into new afib with controlled ventricular rate around 5AM on 10/28. 150mg  amiodarone IV ordered which was given by nurse just now  - planning for DCCV with anesthesia today, given recent AMS, may need TEE as well   5. CAD s/p stent to RCA 1999  6. H/o complete HB s/p MDT PPM 01/10/2015  - [employer] pulse generator serial number [id number]   7. HTN  8. DM  9. ESRD on HD MWF  - does not appear too volume overloaded on exam today. Nephrology  following  10. OSA  Signed, Almyra Deforest PA-C Pager: [phone]   The patient was seen, examined and discussed with Almyra Deforest, PA-C and I agree with the above.   LYELL CLUGSTON is a 74 y.o. male with a hx of ESRD on HD, DM, HTN, s/p PM placement in May 2016, CAD status post CABG on 06/14/2015, with altered mental statis post surgery. Neurology following and plans work up, however his PM is not compatible with MRI (verified wit [employer]). We were called because he went into a-fib the last night at 9 pm and was started on amiodarone drip. He just spontaneously cardioverted back to SR. I would recommend to complete iv infusion followed by amiodarone PO 400 mg BID x 3 days, then 400 mg po daily x 3 days, followed by 200 mg po daily until approximately 3 months post surgery if remain in SR. Hold anticoagulation as his a-fib < 24 hours.   Dorothy Spark 07/06/2015

## 2015-07-06 NOTE — Progress Notes (Signed)
Patient ID: Antonio Page, male   DOB: 1941/03/26, 74 y.o.   MRN: [id number] [facility] DAILY ICU PROGRESS NOTE                   [address]            [place] [address]          [phone]   3 Days Post-Op Procedure(s) (LRB): CORONARY ARTERY BYPASS GRAFTING (CABG) times two using left internal mammary artery and right saphenous leg vein.  [place] to LAD and SVG to distal circumflex. (N/A) TRANSESOPHAGEAL ECHOCARDIOGRAM (TEE) (N/A)  Total Length of Stay:  LOS: 4 days   Subjective: Opens eys but does not follow commands this am  Objective: Vital signs in last 24 hours: Temp:  [98.3 F (36.8 C)-100.4 F (38 C)] 99.9 F (37.7 C) (10/28 0417) Pulse Rate:  [71-121] 111 (10/28 0700) Cardiac Rhythm:  [-] Atrial fibrillation (10/28 0500) Resp:  [14-29] 28 (10/28 0700) BP: (90-145)/(30-89) 114/38 mmHg (10/28 0700) SpO2:  [90 %-100 %] 99 % (10/28 0700) Weight:  [276 lb 14.4 oz (125.6 kg)-278 lb (126.1 kg)] 277 lb 5.4 oz (125.8 kg) (10/28 0400)  Filed Weights   07/05/15 1351 07/05/15 1823 07/06/15 0400  Weight: 278 lb (126.1 kg) 276 lb 14.4 oz (125.6 kg) 277 lb 5.4 oz (125.8 kg)    Weight change: -10 lb 12.8 oz (-4.9 kg)   Hemodynamic parameters for last 24 hours:    Intake/Output from previous day: 10/27 0701 - 10/28 0700 In: 660 [P.O.:180; I.V.:480] Out: 299 [Chest Tube:20]  Intake/Output this shift:    Current Meds: Scheduled Meds: . acetaminophen  1,000 mg Oral 4 times per day   Or  . acetaminophen (TYLENOL) oral liquid 160 mg/5 mL  1,000 mg Per Tube 4 times per day  . allopurinol  150 mg Oral Daily  . antiseptic oral rinse  7 mL Mouth Rinse BID  . aspirin EC  325 mg Oral Daily   Or  . aspirin  324 mg Per Tube Daily  . atorvastatin  80 mg Oral q1800  . bisacodyl  10 mg Oral Daily   Or  . bisacodyl  10 mg Rectal Daily  . Chlorhexidine Gluconate Cloth  6 each Topical Daily  . cinacalcet  30 mg Oral Q supper  . darbepoetin (ARANESP) injection -  DIALYSIS  60 mcg Intravenous Q Mon-HD  . docusate sodium  200 mg Oral Daily  . doxercalciferol  5 mcg Intravenous Q M,W,F-HD  . enoxaparin (LOVENOX) injection  30 mg Subcutaneous Q24H  . insulin aspart  0-24 Units Subcutaneous 6 times per day  . insulin regular  0-10 Units Intravenous TID WC  . loratadine  10 mg Oral Daily  . magnesium sulfate  4 g Intravenous Once  . metoprolol tartrate  12.5 mg Oral BID   Or  . metoprolol tartrate  12.5 mg Per Tube BID  . multivitamin  1 tablet Oral QHS  . mupirocin ointment  1 application Nasal BID  . pantoprazole  40 mg Oral Daily  . sodium chloride  3 mL Intravenous Q12H  . sucroferric oxyhydroxide  1,000 mg Oral TID WC   Continuous Infusions: . sodium chloride 20 mL/hr at 07/04/15 0400  . sodium chloride 250 mL (07/05/15 0400)  . sodium chloride 10 mL/hr at 07/05/15 0400  . dexmedetomidine Stopped (06/13/2015 1600)  . insulin (NOVOLIN-R) infusion Stopped (07/04/15 1400)  . lactated ringers    . lactated ringers    .  nitroGLYCERIN Stopped (07/04/15 1700)  . phenylephrine (NEO-SYNEPHRINE) Adult infusion Stopped (07/04/15 1200)   PRN Meds:.sodium chloride, lactated ringers, metoprolol, midazolam, morphine injection, ondansetron (ZOFRAN) IV, oxyCODONE, sodium chloride, traMADol  General appearance: slowed mentation, uncooperative and moves all extremities but not call family memebers names or following commands Neurologic: moves all extremities, but not command Heart: irregularly irregular rhythm Lungs: diminished breath sounds bibasilar Abdomen: soft, non-tender; bowel sounds normal; no masses,  no organomegaly Extremities: extremities normal, atraumatic, no cyanosis or edema and Homans sign is negative, no sign of DVT Wound: sternum stable  Lab Results: CBC: Recent Labs  07/05/15 0430 07/06/15 0410  WBC 13.1* 7.8  HGB 8.6* 8.1*  HCT 26.4* 24.8*  PLT 105* 118*   BMET:  Recent Labs  07/05/15 0430 07/06/15 0410  NA 135  135 140    K 5.5*  5.4* 4.8  CL 98*  99* 98*  CO2 25  26 28   GLUCOSE 159*  156* 141*  BUN 52*  50* 32*  CREATININE 9.08*  8.73* 6.67*  CALCIUM 9.8  9.7 10.0    PT/INR:  Recent Labs  06/14/2015 1350  LABPROT 17.6*  INR 1.44   Radiology: Ct Head Wo Contrast  07/06/2015  CLINICAL DATA:  74 year old male with worsening confusion. EXAM: CT HEAD WITHOUT CONTRAST TECHNIQUE: Contiguous axial images were obtained from the base of the skull through the vertex without intravenous contrast. COMPARISON:  Head CT 01/2015. FINDINGS: Small sclerotic right globe. Patchy and confluent areas of decreased attenuation are noted throughout the deep and periventricular white matter of the cerebral hemispheres bilaterally, compatible with chronic microvascular ischemic disease. No acute intracranial abnormalities. Specifically, no evidence of acute intracranial hemorrhage, no definite findings of acute/subacute cerebral ischemia, no mass, mass effect, hydrocephalus or abnormal intra or extra-axial fluid collections. Visualized paranasal sinuses and mastoids are well pneumatized. No acute displaced skull fractures are identified. IMPRESSION: 1. No acute intracranial abnormalities. 2. Chronic microvascular ischemic changes in cerebral white matter as above. Electronically Signed   By: Vinnie Langton M.D.   On: 07/06/2015 02:07   Dg Chest Port 1 View  07/06/2015  CLINICAL DATA:  Status post CABG on July 03, 2015, dialysis dependent renal failure, pacemaker. EXAM: PORTABLE CHEST 1 VIEW COMPARISON:  Portable chest x-rays of October 26 and 03/28/2015 FINDINGS: The lungs are slightly better inflated today. The interstitial markings remain increased. There is left basilar atelectasis. A there is a small left pleural effusion. The cardiac silhouette is enlarged. The central pulmonary vascularity is more prominent today. The right internal jugular Cordis sheath tip projects over the proximal SVC. There are 6 intact sternal  wires visible on today's study. The permanent pacemaker is in reasonable position. IMPRESSION: Increased prominence of the pulmonary vascularity and pulmonary interstitium since yesterday's study is consistent with slight worsening of CHF. There is persistent left lower lobe atelectasis and small left pleural effusion. Electronically Signed   By: David  Martinique M.D.   On: 07/06/2015 07:31     Assessment/Plan: S/P Procedure(s) (LRB): CORONARY ARTERY BYPASS GRAFTING (CABG) times two using left internal mammary artery and right saphenous leg vein.  [place] to LAD and SVG to distal circumflex. (N/A) TRANSESOPHAGEAL ECHOCARDIOGRAM (TEE) (N/A) Metabolic encephalopathy, r/o stroke not indicated on ct of head, have asked neurology to see New afib this am hr 103- start Cordarone Need pic line for vascular access and d/c right ij Increased pulmonary congestion  Dialysis today On dvt lovenox currently 30 mg daily if AF continues will have  to increase, now in af  Two hours will have cardiology see early, ? Cardioversion     Grace Isaac 07/06/2015 7:36 AM

## 2015-07-06 NOTE — Sedation Documentation (Signed)
Transferred from unit by stretcher. Pt with eyes open but non-verbal. Does not follow commands. Report received from  Unit nurse.

## 2015-07-06 NOTE — Progress Notes (Signed)
Patient ID: Antonio Page, male   DOB: 07-29-1941, 74 y.o.   MRN: [id number] T[facility] DAILY ICU PROGRESS NOTE                   [address]            [place] [address]          [phone]   3 Days Post-Op Procedure(s) (LRB): CORONARY ARTERY BYPASS GRAFTING (CABG) times two using left internal mammary artery and right saphenous leg vein.  [place] to LAD and SVG to distal circumflex. (N/A) TRANSESOPHAGEAL ECHOCARDIOGRAM (TEE) (N/A)  Total Length of Stay:  LOS: 4 days   Subjective: Less responsive after dialysis, bagged and entubated  Objective: Vital signs in last 24 hours: Temp:  [98.3 F (36.8 C)-101.9 F (38.8 C)] 100.8 F (38.2 C) (10/28 1608) Pulse Rate:  [60-121] 87 (10/28 1645) Cardiac Rhythm:  [-] Ventricular paced (10/28 1220) Resp:  [16-32] 29 (10/28 1645) BP: (92-159)/(30-89) 105/38 mmHg (10/28 1645) SpO2:  [90 %-100 %] 97 % (10/28 1645) Weight:  [276 lb 14.4 oz (125.6 kg)-285 lb 4.4 oz (129.4 kg)] 285 lb 4.4 oz (129.4 kg) (10/28 1230)  Filed Weights   07/05/15 1823 07/06/15 0400 07/06/15 1230  Weight: 276 lb 14.4 oz (125.6 kg) 277 lb 5.4 oz (125.8 kg) 285 lb 4.4 oz (129.4 kg)    Weight change: -10 lb 12.8 oz (-4.9 kg)   Hemodynamic parameters for last 24 hours:    Intake/Output from previous day: 10/27 0701 - 10/28 0700 In: 660 [P.O.:180; I.V.:480] Out: 299 [Chest Tube:20]  Intake/Output this shift: Total I/O In: 904.2 [I.V.:404.2; IV Piggyback:500] Out: 3000 [Other:3000]  Current Meds: Scheduled Meds: . albumin human      . allopurinol  150 mg Oral Daily  . antiseptic oral rinse  7 mL Mouth Rinse q12n4p  . aspirin EC  325 mg Oral Daily   Or  . aspirin  324 mg Per Tube Daily  . atorvastatin  80 mg Oral q1800  . bisacodyl  10 mg Oral Daily   Or  . bisacodyl  10 mg Rectal Daily  . chlorhexidine  15 mL Mouth Rinse BID  . Chlorhexidine Gluconate Cloth  6 each Topical Daily  . cinacalcet  30 mg Oral Q supper  . darbepoetin  (ARANESP) injection - DIALYSIS  60 mcg Intravenous Q Mon-HD  . docusate sodium  200 mg Oral Daily  . enoxaparin (LOVENOX) injection  30 mg Subcutaneous Q24H  . insulin aspart  0-24 Units Subcutaneous 6 times per day  . lidocaine      . loratadine  10 mg Oral Daily  . magnesium sulfate  4 g Intravenous Once  . metoprolol tartrate  12.5 mg Oral BID   Or  . metoprolol tartrate  12.5 mg Per Tube BID  . multivitamin  1 tablet Oral QHS  . mupirocin ointment  1 application Nasal BID  . pantoprazole sodium  40 mg Per Tube Daily  . propofol      . sodium chloride  3 mL Intravenous Q12H  . sucroferric oxyhydroxide  1,000 mg Oral TID WC  . vancomycin  1,000 mg Intravenous Q M,W,F-HD   Continuous Infusions: . sodium chloride 20 mL/hr at 07/04/15 0400  . sodium chloride Stopped (07/06/15 1400)  . sodium chloride 10 mL/hr at 07/06/15 1323  . amiodarone    . dexmedetomidine Stopped (06/09/2015 1600)  . feeding supplement (NEPRO CARB STEADY)    . lactated ringers    .  lactated ringers    . nitroGLYCERIN Stopped (07/04/15 1700)  . phenylephrine (NEO-SYNEPHRINE) Adult infusion Stopped (07/04/15 1200)  . propofol (DIPRIVAN) infusion     PRN Meds:.sodium chloride, Place/Maintain arterial line **AND** sodium chloride, acetaminophen, lactated ringers, metoprolol, midazolam, morphine injection, ondansetron (ZOFRAN) IV, oxyCODONE, sodium chloride, traMADol    Lab Results: CBC: Recent Labs  07/05/15 0430 07/06/15 0410  WBC 13.1* 7.8  HGB 8.6* 8.1*  HCT 26.4* 24.8*  PLT 105* 118*   BMET:  Recent Labs  07/05/15 0430 07/06/15 0410  NA 135  135 140  K 5.5*  5.4* 4.8  CL 98*  99* 98*  CO2 25  26 28   GLUCOSE 159*  156* 141*  BUN 52*  50* 32*  CREATININE 9.08*  8.73* 6.67*  CALCIUM 9.8  9.7 10.0    PT/INR: No results for input(s): LABPROT, INR in the last 72 hours. Radiology: Dg Abd 1 View  07/06/2015  CLINICAL DATA:  Evaluate NG tube placement EXAM: ABDOMEN - 1 VIEW COMPARISON:   None. FINDINGS: The feeding tube tip appears to be in the projection of the proximal duodenum. Dilated loops of bowel noted within the upper abdomen. IMPRESSION: Tip of feeding tube is in the projection of the expected location of the proximal duodenum. Electronically Signed   By: Kerby Moors M.D.   On: 07/06/2015 15:15   Ct Head Wo Contrast  07/06/2015  CLINICAL DATA:  74 year old male with worsening confusion. EXAM: CT HEAD WITHOUT CONTRAST TECHNIQUE: Contiguous axial images were obtained from the base of the skull through the vertex without intravenous contrast. COMPARISON:  Head CT 01/2015. FINDINGS: Small sclerotic right globe. Patchy and confluent areas of decreased attenuation are noted throughout the deep and periventricular white matter of the cerebral hemispheres bilaterally, compatible with chronic microvascular ischemic disease. No acute intracranial abnormalities. Specifically, no evidence of acute intracranial hemorrhage, no definite findings of acute/subacute cerebral ischemia, no mass, mass effect, hydrocephalus or abnormal intra or extra-axial fluid collections. Visualized paranasal sinuses and mastoids are well pneumatized. No acute displaced skull fractures are identified. IMPRESSION: 1. No acute intracranial abnormalities. 2. Chronic microvascular ischemic changes in cerebral white matter as above. Electronically Signed   By: Vinnie Langton M.D.   On: 07/06/2015 02:07   Ir Fluoro Guide Cv Line Left  07/06/2015  CLINICAL DATA:  Status post CABG. A right jugular sheath niece to be removed due to fever and additional central venous access has been requested. EXAM: NON-TUNNELED CENTRAL VENOUS CATHETER PLACEMENT WITH ULTRASOUND AND FLUOROSCOPIC GUIDANCE FLUOROSCOPY TIME:  24 seconds. PROCEDURE: The procedure, risks, benefits, and alternatives were explained to the patient's wife. Questions regarding the procedure were encouraged and answered. The patient's wife understands and consents to  the procedure. A time-out was performed prior to the procedure. The left neck and chest were prepped with chlorhexidine in a sterile fashion, and a sterile drape was applied covering the operative field. Maximum barrier sterile technique with sterile gowns and gloves were used for the procedure. Local anesthesia was provided with 1% lidocaine. Ultrasound was used to confirm patency of the left internal jugular vein. After creating a small venotomy incision, a 21 gauge needle was advanced into the left internal jugular vein under direct, real-time ultrasound guidance. Ultrasound image documentation was performed. After securing guidewire access, a peel-away sheath was placed over a guide wire. Utilizing guide wire measurement, a 6 [place], triple-lumen Power Line non tunneled catheter was cut to 26 cm. The catheter was then placed through the sheath  and the sheath removed. Final catheter positioning was confirmed and documented with a fluoroscopic spot image. The catheter was aspirated and flushed with saline. The catheter exit site was secured with 0-Prolene retention sutures. COMPLICATIONS: None.  No pneumothorax. FINDINGS: After catheter placement, the tip lies at the cavoatrial junction. The catheter aspirates normally and is ready for immediate use. IMPRESSION: Placement of non-tunneled central venous catheter via the left internal jugular vein. The catheter tip lies at the cavoatrial junction. The catheter is ready for immediate use. Electronically Signed   By: Aletta Edouard M.D.   On: 07/06/2015 13:25   Ir US Guide Vasc Access Left  07/06/2015  CLINICAL DATA:  Status post CABG. A right jugular sheath niece to be removed due to fever and additional central venous access has been requested. EXAM: NON-TUNNELED CENTRAL VENOUS CATHETER PLACEMENT WITH ULTRASOUND AND FLUOROSCOPIC GUIDANCE FLUOROSCOPY TIME:  24 seconds. PROCEDURE: The procedure, risks, benefits, and alternatives were explained to the patient's  wife. Questions regarding the procedure were encouraged and answered. The patient's wife understands and consents to the procedure. A time-out was performed prior to the procedure. The left neck and chest were prepped with chlorhexidine in a sterile fashion, and a sterile drape was applied covering the operative field. Maximum barrier sterile technique with sterile gowns and gloves were used for the procedure. Local anesthesia was provided with 1% lidocaine. Ultrasound was used to confirm patency of the left internal jugular vein. After creating a small venotomy incision, a 21 gauge needle was advanced into the left internal jugular vein under direct, real-time ultrasound guidance. Ultrasound image documentation was performed. After securing guidewire access, a peel-away sheath was placed over a guide wire. Utilizing guide wire measurement, a 6 [place], triple-lumen Power Line non tunneled catheter was cut to 26 cm. The catheter was then placed through the sheath and the sheath removed. Final catheter positioning was confirmed and documented with a fluoroscopic spot image. The catheter was aspirated and flushed with saline. The catheter exit site was secured with 0-Prolene retention sutures. COMPLICATIONS: None.  No pneumothorax. FINDINGS: After catheter placement, the tip lies at the cavoatrial junction. The catheter aspirates normally and is ready for immediate use. IMPRESSION: Placement of non-tunneled central venous catheter via the left internal jugular vein. The catheter tip lies at the cavoatrial junction. The catheter is ready for immediate use. Electronically Signed   By: Aletta Edouard M.D.   On: 07/06/2015 13:25   Dg Chest Port 1 View  07/06/2015  CLINICAL DATA:  Intubation, respiratory arrest following dialysis EXAM: PORTABLE CHEST 1 VIEW COMPARISON:  07/06/2015 FINDINGS: Endotracheal tube terminates 6 mm above the carina. Mild elevation of the left hemidiaphragm. Blunting of the left costophrenic  angle, raise the possibility small left pleural effusion. Cardiomegaly with pulmonary vascular congestion. Possible mild interstitial edema. No pneumothorax. Median sternotomy. Left subclavian pacemaker. Left IJ venous catheter terminates in the lower SVC. IMPRESSION: Endotracheal tube terminates 6 cm above the carina. Cardiomegaly with mild interstitial edema and possible small left pleural effusion. Left IJ venous catheter terminates in the lower SVC. Electronically Signed   By: Julian Hy M.D.   On: 07/06/2015 17:42   Dg Chest Port 1 View  07/06/2015  CLINICAL DATA:  Status post CABG on July 03, 2015, dialysis dependent renal failure, pacemaker. EXAM: PORTABLE CHEST 1 VIEW COMPARISON:  Portable chest x-rays of October 26 and 03/28/2015 FINDINGS: The lungs are slightly better inflated today. The interstitial markings remain increased. There is left basilar atelectasis. A  there is a small left pleural effusion. The cardiac silhouette is enlarged. The central pulmonary vascularity is more prominent today. The right internal jugular Cord[place] sheath tip projects over the proximal SVC. There are 6 intact sternal wires visible on today's study. The permanent pacemaker is in reasonable position. IMPRESSION: Increased prominence of the pulmonary vascularity and pulmonary interstitium since yesterday's study is consistent with slight worsening of CHF. There is persistent left lower lobe atelectasis and small left pleural effusion. Electronically Signed   By: David  Martinique M.D.   On: 07/06/2015 07:31   Et tube in good position, bronchoscopy at bd side done and cultures obtained   Assessment/Plan: S/P Procedure(s) (LRB): CORONARY ARTERY BYPASS GRAFTING (CABG) times two using left internal mammary artery and right saphenous leg vein.  [place] to LAD and SVG to distal circumflex. (N/A) TRANSESOPHAGEAL ECHOCARDIOGRAM (TEE) (N/A) Ccm consulted Check cultures, adjust antibiotics  As needed  Start  nutrition  Discussed need for intubation with family     Grace Isaac 07/06/2015 5:47 PM

## 2015-07-07 ENCOUNTER — Inpatient Hospital Stay (HOSPITAL_COMMUNITY): Payer: Medicare Other

## 2015-07-07 DIAGNOSIS — Z9911 Dependence on respirator [ventilator] status: Secondary | ICD-10-CM

## 2015-07-07 DIAGNOSIS — R7401 Elevation of levels of liver transaminase levels: Secondary | ICD-10-CM | POA: Insufficient documentation

## 2015-07-07 DIAGNOSIS — E119 Type 2 diabetes mellitus without complications: Secondary | ICD-10-CM

## 2015-07-07 DIAGNOSIS — R740 Nonspecific elevation of levels of transaminase and lactic acid dehydrogenase [LDH]: Secondary | ICD-10-CM

## 2015-07-07 DIAGNOSIS — N10 Acute tubulo-interstitial nephritis: Secondary | ICD-10-CM

## 2015-07-07 DIAGNOSIS — Z794 Long term (current) use of insulin: Secondary | ICD-10-CM

## 2015-07-07 DIAGNOSIS — Z87891 Personal history of nicotine dependence: Secondary | ICD-10-CM

## 2015-07-07 DIAGNOSIS — K567 Ileus, unspecified: Secondary | ICD-10-CM

## 2015-07-07 DIAGNOSIS — A4150 Gram-negative sepsis, unspecified: Secondary | ICD-10-CM

## 2015-07-07 DIAGNOSIS — B965 Pseudomonas (aeruginosa) (mallei) (pseudomallei) as the cause of diseases classified elsewhere: Secondary | ICD-10-CM

## 2015-07-07 DIAGNOSIS — R945 Abnormal results of liver function studies: Secondary | ICD-10-CM

## 2015-07-07 DIAGNOSIS — R195 Other fecal abnormalities: Secondary | ICD-10-CM

## 2015-07-07 DIAGNOSIS — R6521 Severe sepsis with septic shock: Secondary | ICD-10-CM

## 2015-07-07 DIAGNOSIS — E1122 Type 2 diabetes mellitus with diabetic chronic kidney disease: Secondary | ICD-10-CM

## 2015-07-07 DIAGNOSIS — A419 Sepsis, unspecified organism: Secondary | ICD-10-CM | POA: Insufficient documentation

## 2015-07-07 LAB — POCT I-STAT 3, ART BLOOD GAS (G3+)
Acid-Base Excess: 5 mmol/L — ABNORMAL HIGH (ref 0.0–2.0)
Bicarbonate: 28 mEq/L — ABNORMAL HIGH (ref 20.0–24.0)
O2 Saturation: 96 %
PCO2 ART: 33.2 mmHg — AB (ref 35.0–45.0)
TCO2: 29 mmol/L (ref 0–100)
pH, Arterial: 7.535 — ABNORMAL HIGH (ref 7.350–7.450)
pO2, Arterial: 70 mmHg — ABNORMAL LOW (ref 80.0–100.0)

## 2015-07-07 LAB — GLUCOSE, CAPILLARY
GLUCOSE-CAPILLARY: 152 mg/dL — AB (ref 65–99)
GLUCOSE-CAPILLARY: 160 mg/dL — AB (ref 65–99)
GLUCOSE-CAPILLARY: 195 mg/dL — AB (ref 65–99)
GLUCOSE-CAPILLARY: 172 mg/dL — AB (ref 65–99)
GLUCOSE-CAPILLARY: 163 mg/dL — AB (ref 65–99)
Glucose-Capillary: 133 mg/dL — ABNORMAL HIGH (ref 65–99)

## 2015-07-07 LAB — COMPREHENSIVE METABOLIC PANEL
ALT: 299 U/L — ABNORMAL HIGH (ref 17–63)
AST: 511 U/L — ABNORMAL HIGH (ref 15–41)
Albumin: 1.9 g/dL — ABNORMAL LOW (ref 3.5–5.0)
Alkaline Phosphatase: 62 U/L (ref 38–126)
Anion gap: 15 (ref 5–15)
BUN: 30 mg/dL — ABNORMAL HIGH (ref 6–20)
CO2: 25 mmol/L (ref 22–32)
Calcium: 9.6 mg/dL (ref 8.9–10.3)
Chloride: 100 mmol/L — ABNORMAL LOW (ref 101–111)
Creatinine, Ser: 5.21 mg/dL — ABNORMAL HIGH (ref 0.61–1.24)
GFR calc Af Amer: 11 mL/min — ABNORMAL LOW (ref >60)
GFR calc non Af Amer: 10 mL/min — ABNORMAL LOW (ref >60)
Glucose, Bld: 166 mg/dL — ABNORMAL HIGH (ref 65–99)
Potassium: 4.3 mmol/L (ref 3.5–5.1)
Sodium: 140 mmol/L (ref 135–145)
Total Bilirubin: 0.6 mg/dL (ref 0.3–1.2)
Total Protein: 4.8 g/dL — ABNORMAL LOW (ref 6.5–8.1)

## 2015-07-07 LAB — CBC
HCT: 24 % — ABNORMAL LOW (ref 39.0–52.0)
Hemoglobin: 8 g/dL — ABNORMAL LOW (ref 13.0–17.0)
MCH: 32.9 pg (ref 26.0–34.0)
MCHC: 33.3 g/dL (ref 30.0–36.0)
MCV: 98.8 fL (ref 78.0–100.0)
Platelets: 166 10*3/uL (ref 150–400)
RBC: 2.43 MIL/uL — ABNORMAL LOW (ref 4.22–5.81)
RDW: 16 % — ABNORMAL HIGH (ref 11.5–15.5)
WBC: 12.4 10*3/uL — ABNORMAL HIGH (ref 4.0–10.5)

## 2015-07-07 LAB — GRAM STAIN: SPECIAL REQUESTS: NORMAL

## 2015-07-07 LAB — PROCALCITONIN: Procalcitonin: 25.81 ng/mL

## 2015-07-07 LAB — C DIFFICILE QUICK SCREEN W PCR REFLEX
C DIFFICILE (CDIFF) TOXIN: NEGATIVE
C DIFFICLE (CDIFF) ANTIGEN: NEGATIVE
C Diff interpretation: NEGATIVE

## 2015-07-07 LAB — TRIGLYCERIDES: Triglycerides: 102 mg/dL (ref <150)

## 2015-07-07 LAB — MAGNESIUM: Magnesium: 1.7 mg/dL (ref 1.7–2.4)

## 2015-07-07 LAB — PHOSPHORUS: Phosphorus: 3 mg/dL (ref 2.5–4.6)

## 2015-07-07 MED ORDER — LORAZEPAM 2 MG/ML IJ SOLN
1.0000 mg | INTRAMUSCULAR | Status: DC | PRN
  Administered 2015-07-07 (×3): 2 mg via INTRAVENOUS
  Filled 2015-07-07 (×3): qty 1

## 2015-07-07 MED ORDER — NEPRO/CARBSTEADY PO LIQD
1000.0000 mL | ORAL | Status: DC
  Filled 2015-07-07 (×2): qty 1000

## 2015-07-07 MED ORDER — NOREPINEPHRINE BITARTRATE 1 MG/ML IV SOLN
0.0000 ug/min | INTRAVENOUS | Status: DC
  Administered 2015-07-07: 55 ug/min via INTRAVENOUS
  Administered 2015-07-07: 60 ug/min via INTRAVENOUS
  Administered 2015-07-07: 10 ug/min via INTRAVENOUS
  Administered 2015-07-08 (×4): 80 ug/min via INTRAVENOUS
  Filled 2015-07-07 (×9): qty 16

## 2015-07-07 MED ORDER — NOREPINEPHRINE BITARTRATE 1 MG/ML IV SOLN: 0.0000 ug/min | INTRAVENOUS | Status: DC

## 2015-07-07 MED ORDER — PRO-STAT SUGAR FREE PO LIQD
60.0000 mL | Freq: Four times a day (QID) | ORAL | Status: DC
  Filled 2015-07-07 (×5): qty 60

## 2015-07-07 MED ORDER — SODIUM CHLORIDE 0.9 % IV SOLN
500.0000 mg | INTRAVENOUS | Status: DC
  Administered 2015-07-07: 500 mg via INTRAVENOUS
  Filled 2015-07-07 (×2): qty 0.5

## 2015-07-07 MED ORDER — LEVOFLOXACIN IN D5W 500 MG/100ML IV SOLN
500.0000 mg | INTRAVENOUS | Status: DC
  Administered 2015-07-07: 500 mg via INTRAVENOUS
  Filled 2015-07-07: qty 100

## 2015-07-07 MED ORDER — VASOPRESSIN 20 UNIT/ML IV SOLN
0.0400 [IU]/min | INTRAVENOUS | Status: DC
  Administered 2015-07-07: 0.03 [IU]/min via INTRAVENOUS
  Filled 2015-07-07 (×2): qty 2

## 2015-07-07 MED ORDER — INSULIN ASPART 100 UNIT/ML ~~LOC~~ SOLN
0.0000 [IU] | SUBCUTANEOUS | Status: DC
Start: 2015-07-07 — End: 2015-07-08
  Administered 2015-07-07: 3 [IU] via SUBCUTANEOUS
  Administered 2015-07-08: 2 [IU] via SUBCUTANEOUS

## 2015-07-07 MED ORDER — DEXTROSE 5 % IV SOLN: 2.0000 g | INTRAVENOUS | Status: DC

## 2015-07-07 NOTE — Progress Notes (Signed)
Nutrition Follow-up  DOCUMENTATION CODES:  Obesity unspecified  INTERVENTION:  Readjust TF d/t reintubation.    Decrease TF rate. Nepro @ 20 ml/hr via [place] NGT   Add 60 ml Prostat QID.    Tube feeding regimen provides 1664 kcal (100% of needs), 159 grams of protein, and 349 ml of H2O.   NUTRITION DIAGNOSIS:  Inadequate oral intake related to inability to eat as evidenced by NPO status.  ongoing  GOAL:  Provide needs based on ASPEN/SCCM guidelines  MONITOR:  Vent status, TF tolerance, Weight trends, Labs, I & O's  ASSESSMENT:  74 y.o. 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 Foundto have significant calcified LM dx. Now 4 days s/p CABG  He has been NPO since 10/25 Interval Hx 10/29:  RD consulted for enteral/tube feeding initiation and management on 10/28. Cortrak feeding tube placed - confirmed with imaging.  Re-intubated for obtunded status & acute hypoxic respiratory failure  Patient is currently intubated on ventilator support MV: 12.3 L/min Temp (24hrs), Avg:101.6 F (38.7 C), Min:98.9 F (37.2 C), Max:102.8 F (39.3 C)  Propofol: Currently held  Labs: Hypochloremia, Calcium, Hyperglycemia, hypoalbuminemia    Diet Order:  Diet NPO time specified  Skin:  Ecchymotic thigh, skin tear arm, closed incision on chest and right leg  Last BM:  10/26 (incontinent)  Height:  Ht Readings from Last 1 Encounters:  07/07/2015 5\' 11"  (1.803 m)   Weight:  Wt Readings from Last 1 Encounters:  07/07/15 271 lb 2.7 oz (123 kg)   Wt Readings from Last 10 Encounters:  07/07/15 271 lb 2.7 oz (123 kg)  06/26/15 271 lb 12.8 oz (123.288 kg)  04/13/15 273 lb 12.8 oz (124.195 kg)  03/08/15 269 lb 9.6 oz (122.29 kg)  02/20/15 271 lb (122.925 kg)  01/30/15 271 lb (122.925 kg)  01/12/15 270 lb 1 oz  (122.5 kg)  09/11/14 285 lb (129.275 kg)  07/25/14 290 lb 6.4 oz (131.725 kg)  04/21/14 285 lb 12.8 oz (129.638 kg)   Ideal Body Weight:  78.2 kg  BMI:  Body mass index is 37.84 kg/(m^2).  EDW per MD note is 121.5 kg.  Estimated Nutritional Needs:  Kcal:  1337-1701 (11-14 kcal/kg) Protein:  156 g (2 g/kg IBW) Fluid:  per MD  EDUCATION NEEDS:  No education needs identified at this time  Burtis Junes RD, LDN Nutrition Pager: [phone] 07/07/2015 8:40 AM

## 2015-07-07 NOTE — Progress Notes (Signed)
MD notified that the patient's MAP is still low despite max dose of neo.  Per MD ok to titrate for SBP.

## 2015-07-07 NOTE — Progress Notes (Signed)
ANTIBIOTIC CONSULT NOTE - Follow Up  Pharmacy Consult for Meropenem and Levofloxacin  Indication: Gm neg. Sepsis (pseudomonas in UCx)  No Known Allergies  Patient Measurements: Height: 5\' 11"  (180.3 cm) Weight: 271 lb 2.7 oz (123 kg) IBW/kg (Calculated) : 75.3  Vital Signs: Temp: 102.1 F (38.9 C) (10/29 1613) Temp Source: Axillary (10/29 1613) BP: 230/35 mmHg (10/29 1531) Pulse Rate: 59 (10/29 1800)  Labs:  Recent Labs  07/05/15 0430 07/06/15 0410 07/07/15 0416  WBC 13.1* 7.8 12.4*  HGB 8.6* 8.1* 8.0*  PLT 105* 118* 166  CREATININE 9.08*  8.73* 6.67* 5.21*  *ESRD*  Microbiology: Cx data:  Blood: 10/28 (drawn on dialysis): Gm. Neg. rods Urine: 10/28: Gm. Neg rods Resp: 10/28:   Anti-infective's:  Ceftazidine: 10/28 << 10/29 Vancomycin: 10/28 <<  Levofloxacin:  10/29 << Meropenem: 10/29<<  Assessment: 73yoM who is s/p CABG x 2 that developed respiratory failure requiring intubation on 10/28.  He now has gm. negative sepsis and we have been asked to initiate IV antibiotics with Meropenem and Levofloxacin for double coverage.  Goal of Therapy:  treatment of infection   Plan:  - Levofloxacin 500mg  IV every 48 hours - Meropenem 500mg  IV every 24 hours - F/u IHD schedule and schedule subsequent doses accordingly  Rober Minion, PharmD., MS Clinical Pharmacist Pager:  [phone] Thank you for allowing pharmacy to be part of this patients care team. 07/07/2015, 6:51 PM

## 2015-07-07 NOTE — Progress Notes (Addendum)
TCTS BRIEF SICU PROGRESS NOTE  4 Days Post-Op  S/P Procedure(s) (LRB): CORONARY ARTERY BYPASS GRAFTING (CABG) times two using left internal mammary artery and right saphenous leg vein.  [place] to LAD and SVG to distal circumflex. (N/A) TRANSESOPHAGEAL ECHOCARDIOGRAM (TEE) (N/A)   Remains febrile BP improved on levophed and vasopressin - low diastolic pressure - treating MAP Tube feeds on hold due to large volume residual in stomach and possible emesis Preliminary blood culture results 2 of 2 sets positive for GNR's Wound culture swab no organisms seen on Gram stain  Plan: Continue current plan.  Consult Infectious Disease team for assistance with management of sepsis w/ positive blood cultures.  Rexene Alberts, MD 07/07/2015 4:54 PM

## 2015-07-07 NOTE — Progress Notes (Signed)
[facility] Progress Note   Subjective:    S/p 2V CABG (LIMA to LAD, SVG to distal cx) 10/25 Events of 24 hours noted Tolerated 3 liters of vol removal with HD but subsequently had obtundation, progressive resp failure->intubation Bronch neg for mucus plugging Now on pressors for hypotension Febrile, ATB's for HCAP  Physical Exam BP 147/37 mmHg  Pulse 73  Temp(Src) 102.5 F (39.2 C) (Axillary)  Resp 24  Ht 5\' 11"  (1.803 m)  Wt 123 kg (271 lb 2.7 oz)  BMI 37.84 kg/m2  SpO2 100%  EDW 121.5 Weight 123 kg  Intubated, not reponsive ETT, new left IJ line, OG tube. Anteriorly fairly clear Sternal wound dresssed No external drainage Pacemaker in place left side Abdomen: Protuberant abdomen. Quiet Extremities: 1+ woody LE edema LE's. Right LE incision clean and dry. Bruising R prox thigh Dialysis Access: RUA AVF + bruit   Additional Objective  Recent Labs Lab 07/05/15 0430 07/06/15 0410 07/07/15 0416  NA 135  135 140 140  K 5.5*  5.4* 4.8 4.3  CL 98*  99* 98* 100*  CO2 25  26 28 25   GLUCOSE 159*  156* 141* 166*  BUN 52*  50* 32* 30*  CREATININE 9.08*  8.73* 6.67* 5.21*  CALCIUM 9.8  9.7 10.0 9.6  PHOS 6.8* 6.0* 3.0     Recent Labs Lab 07/04/15 0504 07/04/15 1610  07/05/15 0430 07/06/15 0410 07/07/15 0416  WBC 16.4* 14.4*  --  13.1* 7.8 12.4*  HGB 8.9* 9.1*  < > 8.6* 8.1* 8.0*  HCT 27.8* 26.6*  < > 26.4* 24.8* 24.0*  MCV 98.6 96.7  --  97.4 98.0 98.8  PLT 111* 94*  --  105* 118* 166  < > = values in this interval not displayed.   Recent Labs Lab 07/06/15 1606 07/06/15 1957 07/06/15 2340 07/07/15 0355 07/07/15 0744  GLUCAP 132* 141* 133* 152* 160*   Cultures: Blood - pending (neg to date) Sternal wound drainage - few WBC's no orgs, cx pending Urine - pending Resp - pending  Medications: Infusions: . sodium chloride 20 mL/hr at 07/04/15 0400  . sodium chloride Stopped (07/06/15 1400)  . sodium chloride 10 mL/hr at  07/06/15 1323  . amiodarone 30 mg/hr (07/07/15 0516)  . feeding supplement (NEPRO CARB STEADY)    . lactated ringers    . lactated ringers    . norepinephrine (LEVOPHED) Adult infusion    . phenylephrine (NEO-SYNEPHRINE) Adult infusion 80 mcg/min (07/07/15 0930)  . propofol (DIPRIVAN) infusion Stopped (07/07/15 0930)   Scheduled: . antiseptic oral rinse  7 mL Mouth Rinse q12n4p  . aspirin EC  325 mg Oral Daily   Or  . aspirin  324 mg Per Tube Daily  . chlorhexidine  15 mL Mouth Rinse BID  . Chlorhexidine Gluconate Cloth  6 each Topical Daily  . darbepoetin (ARANESP) injection - DIALYSIS  60 mcg Intravenous Q Mon-HD  . feeding supplement (PRO-STAT SUGAR FREE 64)  60 mL Per Tube QID  . heparin subcutaneous  5,000 Units Subcutaneous 3 times per day  . insulin aspart  0-9 Units Subcutaneous 6 times per day  . magnesium sulfate  4 g Intravenous Once  . metoprolol tartrate  12.5 mg Oral BID   Or  . metoprolol tartrate  12.5 mg Per Tube BID  . mupirocin ointment  1 application Nasal BID  . pantoprazole sodium  40 mg Per Tube Daily  . sodium chloride  3 mL Intravenous Q12H  .  sucroferric oxyhydroxide  1,000 mg Oral TID WC  . vancomycin  1,000 mg Intravenous Q M,W,F-HD   Dialysis Orders: Center: [facility] on MWF . EDW 121.5 kg  HD Bath 2.0 K 2.0 Ca  Time 4 hours 15 minutes  Heparin 4000 Units per tx.  Access RUA AVF  BFR 500 DFR 800 manual  Mircera 50 mcg IV q 2 weeks (last dose 06/20/2015) Hectoral 5 mcg IV q MWF  Assessment/Recommendations: 1. CAD -  S/P 2 vessel CABG LIMA to LAD, SVG to distal cx 10/25.  2. Hypotension - requiring pressor support 3. PAF - IV amio 4. Fever/leukocytosis. Presumed HCAP. Possible sternal wound infection.  Vanco and ceftazidine. Multiple pending cultures including blood, sternal wound, urine and resp. 5. VDRF - intubated 10/28. Bronch neg for mucus plugging.  6. Encephalopathy - Unresponsive. CT brain showed no acute intracranial abnormalities,  chronic microvascular ischemic changes in cerebral white matter. Neuro plans EEG 7. ESRD - MWF outpt. Had HD Monday, Wed, Thurs, Friday. Electrolyte wise looks OK right now. With turn of events last 24 hours (hypotension, VDRF, pressor initiation) his obligatory intake with drips and TF's now  around 140 ml/hour and unlikely will be able to keep up with IHD. I anticipate that if he is not able to come down on drips/pressors in next 24 hours that he will require CRRT (in which case will need to ask CCM to place a catheter).    8. Elevated transaminases - ? hypoperfusion 9. 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 Anemia - CKD + ABLA. Started darbe 60/week on 10/24. (Rec'd last outpt dose of Mircera 06/20/15). Continue same. Hb down to 8.0.For transfusion. 11. Metabolic bone disease - Resume sensipar and binders when taking po. Corrected calcium currently around 11.5. Stopped hectorol for now.  12. Nutrition - TF's 13. DM -  Per primary. 14. [place], MD Hugo Pager 07/07/2015, 11:10 AM

## 2015-07-07 NOTE — Consult Note (Addendum)
[facility] for Infectious Disease  Date of Admission:  07/09/2015  Date of Consult:  07/07/2015  Reason for Consult: Gram Negative Sepsis Referring Physician: Roxy Manns  Impression/Recommendation Gram Negative Sepsis (3/4 bottles)  Will start merrem (lower seizure risk vs imipenem) and levaquin  Will repeat his BCx  Await his sternal Cx  Await his BAL  Could consider CT of chest to eval for sternal wound infection (this may be hard to interpret given recent  surgery)  Await ID of GNR  Consider stopping vanco as he improves   Pyelonephritis, possible Urosepsis  UCx is > 100k pseudomonas  Will start him on "double coverage" for this, for now.   His UA did have 11-20 WBC, small LE but negative Nitr  Ileus  Needs bowel hygiene.   Continue OGT  Consider imaging to f/u  CAD, s/p CABG Previous Pacer  ESRD  RUE fistula  Is now getting CRRT  Has RUE HD site, clean  Loose BM  C diff toxin (-)  Elevated LFTs  Suggest passive congestion due to sepsis  RUQ  U/s- sludge, hepatic steatosis  Thank you so much for this interesting consult,   Bobby Rumpf (pager) [phone] [email or web address]  Antonio Page is an 74 y.o. male.  HPI: 74 yo M with hx of RCA stent 1999, ESRD (on HD for 4 years), DM2 > 30 yrs, ? Sarcoid, previous adm 01-2015 for syncopal episodes. He had pacer placed at that time due to intermittent third degree AVB. He had an abnormal lexiscan at that time as well, terminated early due to periods of asystole.  By September he developed substernal CP with radiation into his neck. He underwent catheterization 10-20 and was found to have 80% L main stenosis, 95% distal ostial circumflex. NL LVEF. He remained in hospital, notable for difficulty with HD sessions.  He underwent CABG x 2 (LIMA to ALD, SVG to distal circ) on 10-25. He was able to be weaned off vent same day. Otherwise his post-op course was notable for anemai, thrombocytopenia and hyperkalemia.  He had a partial HD session on 10-26.  By 10-27 he had become confused and there was a question of L sided neglect. He went into new a-fib, started on amio. He underwent CT head that was (-) for acute change. His CXR was felt to show worsening CHF. He had temp 100.4. His mental status continued to worsen. By 10-28 he was started on IV vanco/ceftaz (one dose) after he had temp 101.9. He required re-intubation. He had BCx sent as well BAL. His IJ was changed.  Today he had fever to 102.5, has required increasing dose of pressors (vasopressin and neo). Serosanguinous sternal wound d/c was noted, sent for Cx.   Past Medical History  Diagnosis Date  . Hypertension   . GERD (gastroesophageal reflux disease)   . Arthritis   . Glaucoma   . Blind right eye   . Coronary artery disease     a. s/p PCI/stent-> RCA;  b. 02/2015 MV: infarct/no ischemia;  c. 06/2015 Cath: LM 80,LCX 95ost, 22m, RCA patent stent.  . Hypoxia 01/11/2015  . CHB (complete heart block) ([place]) 01/08/15    PPM placed MDT  . ESRD needing dialysis [facility])     MWF at [facility] (06/29/2015)  . Kidney stones   . Type II diabetes mellitus ([place])   . OSA on CPAP   . Presence of permanent cardiac pacemaker     Past Surgical History  Procedure Laterality Date  . Dg av dialysis  shunt access exist*l* or Left 2010    upper arm-not using  . Dg av dialysis  shunt access exist*l* or Right 2012    upper arm-using this one  . Shoulder arthroscopy Right   . Knee arthroscopy Left   . Tonsillectomy    . Coronary angioplasty with stent placement   1999     RCA  . Trigger finger release  06/08/2012    Procedure: RELEASE TRIGGER FINGER/A-1 PULLEY;  Surgeon: Cammie Sickle., MD;  Location: [place];  Service: Orthopedics;  Laterality: Right;  . Dupuytren contracture release  06/08/2012    Procedure: DUPUYTREN CONTRACTURE RELEASE;  Surgeon: Cammie Sickle., MD;  Location: [place];   Service: Orthopedics;  Laterality: Right;  right ring fascia excision with A1 Pulley right ring  . Cystoscopy with retrograde pyelogram, ureteroscopy and stent placement  07/21/2012    Procedure: [place], URETEROSCOPY AND STENT PLACEMENT;  Surgeon: Malka So, MD;  Location: [facility];  Service: Urology;  Laterality: Right;  . 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 Constance Holster   . Tee without cardioversion N/A 03/31/2013    Procedure: TRANSESOPHAGEAL ECHOCARDIOGRAM (TEE);  Surgeon: Sanda Klein, MD;  Location: [facility] ENDOSCOPY;  Service: Cardiovascular;  Laterality: N/A;  . Revision of arteriovenous goretex graft Right 10/19/2013    Procedure: REVISION OF ARTERIOVENOUS GORETEX GRAFT - thrombectomy;  Surgeon: Rosetta Posner, MD;  Location: [place];  Service: Vascular;  Laterality: Right;  . Cardiac catheterization N/A 01/08/2015    Procedure: Temporary Pacemaker;  Surgeon: Sanda Klein, MD;  Location: [place] INVASIVE CV LAB CUPID;  Service: Cardiovascular;  Laterality: N/A;  . Ep implantable device N/A 01/10/2015    Procedure: Pacemaker Implant;  Surgeon: Deboraha Sprang, MD;  Location: [facility] INVASIVE CV LAB CUPID;  Service: Cardiovascular;  Laterality: N/A;  . Cardiac catheterization N/A 06/17/2015    Procedure: Left Heart Cath and Coronary Angiography;  Surgeon: Lorretta Harp, MD;  Location: [place] CV LAB;  Service: Cardiovascular;  Laterality: N/A;  . Insert / replace / remove pacemaker    . Eye surgery Right multiple  . Coronary artery bypass graft N/A 06/22/2015    Procedure: CORONARY ARTERY BYPASS GRAFTING (CABG) times two using left internal mammary artery and right saphenous leg vein.  [place] to LAD and SVG to distal circumflex.;  Surgeon: Grace Isaac, MD;  Location: [place];   Service: Open Heart Surgery;  Laterality: N/A;  . Tee without cardioversion N/A 06/10/2015    Procedure: TRANSESOPHAGEAL ECHOCARDIOGRAM (TEE);  Surgeon: Grace Isaac, MD;  Location: [place];  Service: Open Heart Surgery;  Laterality: N/A;  . Medtronic adapta pacemaker insection       No Known Allergies  Medications:  Scheduled: . antiseptic oral rinse  7 mL Mouth Rinse q12n4p  . aspirin EC  325 mg Oral Daily   Or  . aspirin  324 mg Per Tube Daily  . [START ON 07/09/2015] cefTAZidime (FORTAZ)  IV  2 g Intravenous Q M,W,F-HD  . chlorhexidine  15 mL Mouth Rinse BID  . Chlorhexidine Gluconate Cloth  6 each Topical Daily  . darbepoetin (ARANESP) injection - DIALYSIS  60 mcg Intravenous  Q Mon-HD  . feeding supplement (PRO-STAT SUGAR FREE 64)  60 mL Per Tube QID  . heparin subcutaneous  5,000 Units Subcutaneous 3 times per day  . insulin aspart  0-15 Units Subcutaneous 6 times per day  . magnesium sulfate  4 g Intravenous Once  . metoprolol tartrate  12.5 mg Oral BID   Or  . metoprolol tartrate  12.5 mg Per Tube BID  . mupirocin ointment  1 application Nasal BID  . pantoprazole sodium  40 mg Per Tube Daily  . sodium chloride  3 mL Intravenous Q12H  . sucroferric oxyhydroxide  1,000 mg Oral TID WC  . vancomycin  1,000 mg Intravenous Q M,W,F-HD    Abtx:  Anti-infectives    Start     Dose/Rate Route Frequency Ordered Stop   07/09/15 1200  cefTAZidime (FORTAZ) 2 g in dextrose 5 % 50 mL IVPB     2 g 100 mL/hr over 30 Minutes Intravenous Every M-W-F (Hemodialysis) 07/07/15 1152     07/06/15 1815  cefTAZidime (FORTAZ) 2 g in dextrose 5 % 50 mL IVPB     2 g 100 mL/hr over 30 Minutes Intravenous  Once 07/06/15 1814 07/06/15 2024   07/06/15 1200  vancomycin (VANCOCIN) IVPB 1000 mg/200 mL premix     1,000 mg 200 mL/hr over 60 Minutes Intravenous Every M-W-F (Hemodialysis) 07/06/15 1039     07/06/15 1030  vancomycin (VANCOCIN) 2,000 mg in sodium chloride 0.9 % 500 mL IVPB     2,000  mg 250 mL/hr over 120 Minutes Intravenous  Once 07/06/15 1021 07/06/15 1525   07/04/15 1800  cefUROXime (ZINACEF) 750 g in dextrose 5 % 50 mL IVPB     750 g 100 mL/hr over 30 Minutes Intravenous  Once 06/19/2015 1330 07/04/15 1754   06/13/2015 1945  vancomycin (VANCOCIN) IVPB 1000 mg/200 mL premix  Status:  Discontinued     1,000 mg 200 mL/hr over 60 Minutes Intravenous  Once 07/05/2015 1330 07/02/2015 1358   06/20/2015 0400  vancomycin (VANCOCIN) 1,500 mg in sodium chloride 0.9 % 250 mL IVPB     1,500 mg 125 mL/hr over 120 Minutes Intravenous To Surgery 07/02/15 1719 06/27/2015 0819   06/15/2015 0400  cefUROXime (ZINACEF) 1.5 g in dextrose 5 % 50 mL IVPB     1.5 g 100 mL/hr over 30 Minutes Intravenous To Surgery 07/02/15 1719 06/27/2015 1205   06/23/2015 0400  cefUROXime (ZINACEF) 750 mg in dextrose 5 % 50 mL IVPB  Status:  Discontinued     750 mg 100 mL/hr over 30 Minutes Intravenous To Surgery 07/02/15 1717 06/13/2015 1330      Total days of antibiotics: 2 Ceftaz/vanco 10-28          Social History:  reports that he quit smoking about 20 years ago. His smoking use included Cigars. He does not have any smokeless tobacco history on file. He reports that he does not drink alcohol or use illicit drugs.  Family History  Problem Relation Age of Onset  . Heart disease Father   . Kidney disease Father   . Hypertension Mother   . Cancer Brother   . Hypertension Brother     General ROS: unobtainable from pt. per nursing make dark loose BM, fleculatnt material in OGT.   Blood pressure 230/35, pulse 60, temperature 102.1 F (38.9 C), temperature source Axillary, resp. rate 31, height $RemoveBe'5\' 11"'IUPfBIEgR$  (1.803 m), weight 123 kg (271 lb 2.7 oz), SpO2 96 %. General appearance: no distress and  intubated, sedated Eyes: R eye opaque.  Neck: supple, symmetrical, trachea midline and L IJ Lungs: rhonchi bilaterally and midline chest wound is partially undressed, no d/c, clean.  Heart: regular rate and rhythm Abdomen:  abnormal findings:  distended and hypoactive bowel sounds Extremities: edema mild UE.  and LE are cool. there are no diabetic foot lesions RUE AV fistula is clean, +bruit.    Results for orders placed or performed during the hospital encounter of 06/29/2015 (from the past 48 hour(s))  Glucose, capillary     Status: Abnormal   Collection Time: 07/05/15  7:33 PM  Result Value Ref Range   Glucose-Capillary 122 (H) 65 - 99 mg/dL   Comment 1 Notify RN   Glucose, capillary     Status: Abnormal   Collection Time: 07/05/15 11:21 PM  Result Value Ref Range   Glucose-Capillary 113 (H) 65 - 99 mg/dL   Comment 1 Notify RN   Renal function panel     Status: Abnormal   Collection Time: 07/06/15  4:10 AM  Result Value Ref Range   Sodium 140 135 - 145 mmol/L   Potassium 4.8 3.5 - 5.1 mmol/L   Chloride 98 (L) 101 - 111 mmol/L   CO2 28 22 - 32 mmol/L   Glucose, Bld 141 (H) 65 - 99 mg/dL   BUN 32 (H) 6 - 20 mg/dL   Creatinine, Ser 6.67 (H) 0.61 - 1.24 mg/dL   Calcium 10.0 8.9 - 10.3 mg/dL   Phosphorus 6.0 (H) 2.5 - 4.6 mg/dL   Albumin 2.3 (L) 3.5 - 5.0 g/dL   GFR calc non Af Amer 7 (L) >60 mL/min   GFR calc Af Amer 8 (L) >60 mL/min    Comment: (NOTE) The eGFR has been calculated using the CKD EPI equation. This calculation has not been validated in all clinical situations. eGFR's persistently <60 mL/min signify possible Chronic Kidney Disease.    Anion gap 14 5 - 15  CBC     Status: Abnormal   Collection Time: 07/06/15  4:10 AM  Result Value Ref Range   WBC 7.8 4.0 - 10.5 K/uL   RBC 2.53 (L) 4.22 - 5.81 MIL/uL   Hemoglobin 8.1 (L) 13.0 - 17.0 g/dL   HCT 24.8 (L) 39.0 - 52.0 %   MCV 98.0 78.0 - 100.0 fL   MCH 32.0 26.0 - 34.0 pg   MCHC 32.7 30.0 - 36.0 g/dL   RDW 15.9 (H) 11.5 - 15.5 %   Platelets 118 (L) 150 - 400 K/uL    Comment: CONSISTENT WITH PREVIOUS RESULT  Magnesium     Status: None   Collection Time: 07/06/15  4:10 AM  Result Value Ref Range   Magnesium 1.8 1.7 - 2.4 mg/dL    Glucose, capillary     Status: Abnormal   Collection Time: 07/06/15  4:19 AM  Result Value Ref Range   Glucose-Capillary 130 (H) 65 - 99 mg/dL   Comment 1 Notify RN   Glucose, capillary     Status: Abnormal   Collection Time: 07/06/15  7:51 AM  Result Value Ref Range   Glucose-Capillary 133 (H) 65 - 99 mg/dL   Comment 1 Capillary Specimen    Comment 2 Notify RN   Glucose, capillary     Status: Abnormal   Collection Time: 07/06/15 12:53 PM  Result Value Ref Range   Glucose-Capillary 156 (H) 65 - 99 mg/dL   Comment 1 Capillary Specimen    Comment 2 Notify RN  TSH     Status: None   Collection Time: 07/06/15  1:30 PM  Result Value Ref Range   TSH 2.885 0.350 - 4.500 uIU/mL  Culture, blood (routine x 2)     Status: None (Preliminary result)   Collection Time: 07/06/15  1:30 PM  Result Value Ref Range   Specimen Description BLOOD HEMODIALYSIS LINE    Special Requests BOTTLES DRAWN AEROBIC AND ANAEROBIC 5CC    Culture  Setup Time      GRAM NEGATIVE RODS ANAEROBIC BOTTLE ONLY CRITICAL RESULT CALLED TO, READ BACK BY AND VERIFIED WITH: N ZAFIRIS,RN AT 1158 07/07/15 BY L BENFIELD    Culture GRAM NEGATIVE RODS    Report Status PENDING   Culture, blood (routine x 2)     Status: None (Preliminary result)   Collection Time: 07/06/15  2:30 PM  Result Value Ref Range   Specimen Description BLOOD HEMODIALYSIS LINE    Special Requests BOTTLES DRAWN AEROBIC AND ANAEROBIC 5CC    Culture  Setup Time      GRAM NEGATIVE RODS IN BOTH AEROBIC AND ANAEROBIC BOTTLES CRITICAL RESULT CALLED TO, READ BACK BY AND VERIFIED WITH: N ZAFIRIS,RN AT 1317 07/07/15 BY L BENFIELD    Culture GRAM NEGATIVE RODS    Report Status PENDING   Ammonia     Status: None   Collection Time: 07/06/15  2:30 PM  Result Value Ref Range   Ammonia 20 9 - 35 umol/L  Lactic acid, plasma     Status: None   Collection Time: 07/06/15  2:30 PM  Result Value Ref Range   Lactic Acid, Venous 1.2 0.5 - 2.0 mmol/L  Culture,  Urine     Status: None (Preliminary result)   Collection Time: 07/06/15  3:09 PM  Result Value Ref Range   Specimen Description URINE, CATHETERIZED    Special Requests none Immunocompromised    Culture >=100,000 COLONIES/mL PSEUDOMONAS AERUGINOSA    Report Status PENDING   Glucose, capillary     Status: Abnormal   Collection Time: 07/06/15  4:06 PM  Result Value Ref Range   Glucose-Capillary 132 (H) 65 - 99 mg/dL   Comment 1 Capillary Specimen    Comment 2 Notify RN   I-STAT 3, arterial blood gas (G3+)     Status: Abnormal   Collection Time: 07/06/15  5:07 PM  Result Value Ref Range   pH, Arterial 7.447 7.350 - 7.450   pCO2 arterial 39.0 35.0 - 45.0 mmHg   pO2, Arterial 32.0 (LL) 80.0 - 100.0 mmHg   Bicarbonate 26.9 (H) 20.0 - 24.0 mEq/L   TCO2 28 0 - 100 mmol/L   O2 Saturation 64.0 %   Acid-Base Excess 3.0 (H) 0.0 - 2.0 mmol/L   Patient temperature 98.6 F    Collection site RADIAL, ALLEN'S TEST ACCEPTABLE    Sample type ARTERIAL   I-STAT 3, arterial blood gas (G3+)     Status: Abnormal   Collection Time: 07/06/15  6:12 PM  Result Value Ref Range   pH, Arterial 7.411 7.350 - 7.450   pCO2 arterial 42.4 35.0 - 45.0 mmHg   pO2, Arterial 249.0 (H) 80.0 - 100.0 mmHg   Bicarbonate 26.7 (H) 20.0 - 24.0 mEq/L   TCO2 28 0 - 100 mmol/L   O2 Saturation 100.0 %   Acid-Base Excess 2.0 0.0 - 2.0 mmol/L   Patient temperature 100.4 F    Collection site RADIAL, ALLEN'S TEST ACCEPTABLE    Drawn by VP    Sample type  ARTERIAL   Procalcitonin - Baseline     Status: None   Collection Time: 07/06/15  7:02 PM  Result Value Ref Range   Procalcitonin 13.03 ng/mL    Comment:        Interpretation: PCT >= 10 ng/mL: Important systemic inflammatory response, almost exclusively due to severe bacterial sepsis or septic shock. (NOTE)         ICU PCT Algorithm               Non ICU PCT Algorithm    ----------------------------     ------------------------------         PCT < 0.25 ng/mL                  PCT < 0.1 ng/mL     Stopping of antibiotics            Stopping of antibiotics       strongly encouraged.               strongly encouraged.    ----------------------------     ------------------------------       PCT level decrease by               PCT < 0.25 ng/mL       >= 80% from peak PCT       OR PCT 0.25 - 0.5 ng/mL          Stopping of antibiotics                                             encouraged.     Stopping of antibiotics           encouraged.    ----------------------------     ------------------------------       PCT level decrease by              PCT >= 0.25 ng/mL       < 80% from peak PCT        AND PCT >= 0.5 ng/mL             Continuing antibiotics                                              encouraged.       Continuing antibiotics            encouraged.    ----------------------------     ------------------------------     PCT level increase compared          PCT > 0.5 ng/mL         with peak PCT AND          PCT >= 0.5 ng/mL             Escalation of antibiotics                                          strongly encouraged.      Escalation of antibiotics        strongly encouraged.   Glucose, capillary     Status: Abnormal   Collection Time: 07/06/15  7:57 PM  Result Value Ref Range   Glucose-Capillary 141 (H) 65 - 99 mg/dL   Comment 1 Notify RN    Comment 2 Document in Chart   Glucose, capillary     Status: Abnormal   Collection Time: 07/06/15 11:40 PM  Result Value Ref Range   Glucose-Capillary 133 (H) 65 - 99 mg/dL   Comment 1 Notify RN    Comment 2 Document in Chart   I-STAT 3, arterial blood gas (G3+)     Status: Abnormal   Collection Time: 07/07/15 12:40 AM  Result Value Ref Range   pH, Arterial 7.535 (H) 7.350 - 7.450   pCO2 arterial 33.2 (L) 35.0 - 45.0 mmHg   pO2, Arterial 70.0 (L) 80.0 - 100.0 mmHg   Bicarbonate 28.0 (H) 20.0 - 24.0 mEq/L   TCO2 29 0 - 100 mmol/L   O2 Saturation 96.0 %   Acid-Base Excess 5.0 (H) 0.0 - 2.0 mmol/L    Patient temperature 99.0 F    Sample type ARTERIAL   Glucose, capillary     Status: Abnormal   Collection Time: 07/07/15  3:55 AM  Result Value Ref Range   Glucose-Capillary 152 (H) 65 - 99 mg/dL   Comment 1 Notify RN   Comprehensive metabolic panel     Status: Abnormal   Collection Time: 07/07/15  4:16 AM  Result Value Ref Range   Sodium 140 135 - 145 mmol/L   Potassium 4.3 3.5 - 5.1 mmol/L   Chloride 100 (L) 101 - 111 mmol/L   CO2 25 22 - 32 mmol/L   Glucose, Bld 166 (H) 65 - 99 mg/dL   BUN 30 (H) 6 - 20 mg/dL   Creatinine, Ser 5.21 (H) 0.61 - 1.24 mg/dL   Calcium 9.6 8.9 - 10.3 mg/dL   Total Protein 4.8 (L) 6.5 - 8.1 g/dL   Albumin 1.9 (L) 3.5 - 5.0 g/dL   AST 511 (H) 15 - 41 U/L   ALT 299 (H) 17 - 63 U/L   Alkaline Phosphatase 62 38 - 126 U/L   Total Bilirubin 0.6 0.3 - 1.2 mg/dL   GFR calc non Af Amer 10 (L) >60 mL/min   GFR calc Af Amer 11 (L) >60 mL/min    Comment: (NOTE) The eGFR has been calculated using the CKD EPI equation. This calculation has not been validated in all clinical situations. eGFR's persistently <60 mL/min signify possible Chronic Kidney Disease.    Anion gap 15 5 - 15  CBC     Status: Abnormal   Collection Time: 07/07/15  4:16 AM  Result Value Ref Range   WBC 12.4 (H) 4.0 - 10.5 K/uL   RBC 2.43 (L) 4.22 - 5.81 MIL/uL   Hemoglobin 8.0 (L) 13.0 - 17.0 g/dL   HCT 24.0 (L) 39.0 - 52.0 %   MCV 98.8 78.0 - 100.0 fL   MCH 32.9 26.0 - 34.0 pg   MCHC 33.3 30.0 - 36.0 g/dL   RDW 16.0 (H) 11.5 - 15.5 %   Platelets 166 150 - 400 K/uL  Magnesium     Status: None   Collection Time: 07/07/15  4:16 AM  Result Value Ref Range   Magnesium 1.7 1.7 - 2.4 mg/dL  Phosphorus     Status: None   Collection Time: 07/07/15  4:16 AM  Result Value Ref Range   Phosphorus 3.0 2.5 - 4.6 mg/dL  Procalcitonin     Status: None   Collection Time: 07/07/15  4:16 AM  Result Value Ref  Range   Procalcitonin 25.81 ng/mL    Comment:        Interpretation: PCT >= 10  ng/mL: Important systemic inflammatory response, almost exclusively due to severe bacterial sepsis or septic shock. (NOTE)         ICU PCT Algorithm               Non ICU PCT Algorithm    ----------------------------     ------------------------------         PCT < 0.25 ng/mL                 PCT < 0.1 ng/mL     Stopping of antibiotics            Stopping of antibiotics       strongly encouraged.               strongly encouraged.    ----------------------------     ------------------------------       PCT level decrease by               PCT < 0.25 ng/mL       >= 80% from peak PCT       OR PCT 0.25 - 0.5 ng/mL          Stopping of antibiotics                                             encouraged.     Stopping of antibiotics           encouraged.    ----------------------------     ------------------------------       PCT level decrease by              PCT >= 0.25 ng/mL       < 80% from peak PCT        AND PCT >= 0.5 ng/mL             Continuing antibiotics                                              encouraged.       Continuing antibiotics            encouraged.    ----------------------------     ------------------------------     PCT level increase compared          PCT > 0.5 ng/mL         with peak PCT AND          PCT >= 0.5 ng/mL             Escalation of antibiotics                                          strongly encouraged.      Escalation of antibiotics        strongly encouraged.   Triglycerides     Status: None   Collection Time: 07/07/15  4:16 AM  Result Value Ref Range   Triglycerides 102 <150 mg/dL  Glucose, capillary     Status: Abnormal   Collection Time: 07/07/15  7:44 AM  Result Value Ref Range   Glucose-Capillary 160 (H) 65 - 99 mg/dL   Comment 1 Capillary Specimen    Comment 2 Notify RN   Stat Gram stain     Status: None   Collection Time: 07/07/15  9:37 AM  Result Value Ref Range   Specimen Description STERNUM    Special Requests Normal    Gram  Stain      RARE WBC PRESENT, PREDOMINANTLY PMN NO ORGANISMS SEEN    Report Status 07/07/2015 FINAL   Glucose, capillary     Status: Abnormal   Collection Time: 07/07/15 12:00 PM  Result Value Ref Range   Glucose-Capillary 195 (H) 65 - 99 mg/dL  C difficile quick scan w PCR reflex     Status: None   Collection Time: 07/07/15  1:53 PM  Result Value Ref Range   C Diff antigen NEGATIVE NEGATIVE   C Diff toxin NEGATIVE NEGATIVE   C Diff interpretation Negative for toxigenic C. difficile   Glucose, capillary     Status: Abnormal   Collection Time: 07/07/15  4:09 PM  Result Value Ref Range   Glucose-Capillary 172 (H) 65 - 99 mg/dL      Component Value Date/Time   SDES STERNUM 07/07/2015 0937   SPECREQUEST Normal 07/07/2015 0937   CULT >=100,000 COLONIES/mL PSEUDOMONAS AERUGINOSA 07/06/2015 1509   REPTSTATUS 07/07/2015 FINAL 07/07/2015 0937   Dg Abd 1 View  07/06/2015  CLINICAL DATA:  Evaluate NG tube placement EXAM: ABDOMEN - 1 VIEW COMPARISON:  None. FINDINGS: The feeding tube tip appears to be in the projection of the proximal duodenum. Dilated loops of bowel noted within the upper abdomen. IMPRESSION: Tip of feeding tube is in the projection of the expected location of the proximal duodenum. Electronically Signed   By: Kerby Moors M.D.   On: 07/06/2015 15:15   Ct Head Wo Contrast  07/06/2015  CLINICAL DATA:  74 year old male with worsening confusion. EXAM: CT HEAD WITHOUT CONTRAST TECHNIQUE: Contiguous axial images were obtained from the base of the skull through the vertex without intravenous contrast. COMPARISON:  Head CT 01/2015. FINDINGS: Small sclerotic right globe. Patchy and confluent areas of decreased attenuation are noted throughout the deep and periventricular white matter of the cerebral hemispheres bilaterally, compatible with chronic microvascular ischemic disease. No acute intracranial abnormalities. Specifically, no evidence of acute intracranial hemorrhage, no  definite findings of acute/subacute cerebral ischemia, no mass, mass effect, hydrocephalus or abnormal intra or extra-axial fluid collections. Visualized paranasal sinuses and mastoids are well pneumatized. No acute displaced skull fractures are identified. IMPRESSION: 1. No acute intracranial abnormalities. 2. Chronic microvascular ischemic changes in cerebral white matter as above. Electronically Signed   By: Vinnie Langton M.D.   On: 07/06/2015 02:07   Ir Fluoro Guide Cv Line Left  07/06/2015  CLINICAL DATA:  Status post CABG. A right jugular sheath niece to be removed due to fever and additional central venous access has been requested. EXAM: NON-TUNNELED CENTRAL VENOUS CATHETER PLACEMENT WITH ULTRASOUND AND FLUOROSCOPIC GUIDANCE FLUOROSCOPY TIME:  24 seconds. PROCEDURE: The procedure, risks, benefits, and alternatives were explained to the patient's wife. Questions regarding the procedure were encouraged and answered. The patient's wife understands and consents to the procedure. A time-out was performed prior to the procedure. The left neck and chest were prepped with chlorhexidine in a sterile fashion, and a sterile drape was applied covering the operative field. Maximum barrier sterile technique with sterile gowns and gloves were used for the procedure.  Local anesthesia was provided with 1% lidocaine. Ultrasound was used to confirm patency of the left internal jugular vein. After creating a small venotomy incision, a 21 gauge needle was advanced into the left internal jugular vein under direct, real-time ultrasound guidance. Ultrasound image documentation was performed. After securing guidewire access, a peel-away sheath was placed over a guide wire. Utilizing guide wire measurement, a 6 [place], triple-lumen Power Line non tunneled catheter was cut to 26 cm. The catheter was then placed through the sheath and the sheath removed. Final catheter positioning was confirmed and documented with a fluoroscopic  spot image. The catheter was aspirated and flushed with saline. The catheter exit site was secured with 0-Prolene retention sutures. COMPLICATIONS: None.  No pneumothorax. FINDINGS: After catheter placement, the tip lies at the cavoatrial junction. The catheter aspirates normally and is ready for immediate use. IMPRESSION: Placement of non-tunneled central venous catheter via the left internal jugular vein. The catheter tip lies at the cavoatrial junction. The catheter is ready for immediate use. Electronically Signed   By: Aletta Edouard M.D.   On: 07/06/2015 13:25   Ir US Guide Vasc Access Left  07/06/2015  CLINICAL DATA:  Status post CABG. A right jugular sheath niece to be removed due to fever and additional central venous access has been requested. EXAM: NON-TUNNELED CENTRAL VENOUS CATHETER PLACEMENT WITH ULTRASOUND AND FLUOROSCOPIC GUIDANCE FLUOROSCOPY TIME:  24 seconds. PROCEDURE: The procedure, risks, benefits, and alternatives were explained to the patient's wife. Questions regarding the procedure were encouraged and answered. The patient's wife understands and consents to the procedure. A time-out was performed prior to the procedure. The left neck and chest were prepped with chlorhexidine in a sterile fashion, and a sterile drape was applied covering the operative field. Maximum barrier sterile technique with sterile gowns and gloves were used for the procedure. Local anesthesia was provided with 1% lidocaine. Ultrasound was used to confirm patency of the left internal jugular vein. After creating a small venotomy incision, a 21 gauge needle was advanced into the left internal jugular vein under direct, real-time ultrasound guidance. Ultrasound image documentation was performed. After securing guidewire access, a peel-away sheath was placed over a guide wire. Utilizing guide wire measurement, a 6 [place], triple-lumen Power Line non tunneled catheter was cut to 26 cm. The catheter was then placed  through the sheath and the sheath removed. Final catheter positioning was confirmed and documented with a fluoroscopic spot image. The catheter was aspirated and flushed with saline. The catheter exit site was secured with 0-Prolene retention sutures. COMPLICATIONS: None.  No pneumothorax. FINDINGS: After catheter placement, the tip lies at the cavoatrial junction. The catheter aspirates normally and is ready for immediate use. IMPRESSION: Placement of non-tunneled central venous catheter via the left internal jugular vein. The catheter tip lies at the cavoatrial junction. The catheter is ready for immediate use. Electronically Signed   By: Aletta Edouard M.D.   On: 07/06/2015 13:25   Dg Chest Port 1 View  07/07/2015  CLINICAL DATA:  Acute hypoxic respiratory failure. CABG on 06/22/2015. EXAM: PORTABLE CHEST 1 VIEW COMPARISON:  07/06/2015 and 07/05/2015 FINDINGS: Endotracheal tube, central venous catheter and feeding tube appear in good position. New slight atelectasis/ effusion at the right base. Increased atelectasis and probable small effusion at the left base. Heart size and pulmonary vascularity are unchanged and normal. IMPRESSION: Atelectasis and small effusions, new on the right and increased on the left. Electronically Signed   By: Lorriane Shire M.D.  On: 07/07/2015 08:14   Dg Chest Port 1 View  07/06/2015  CLINICAL DATA:  Intubation, respiratory arrest following dialysis EXAM: PORTABLE CHEST 1 VIEW COMPARISON:  07/06/2015 FINDINGS: Endotracheal tube terminates 6 mm above the carina. Mild elevation of the left hemidiaphragm. Blunting of the left costophrenic angle, raise the possibility small left pleural effusion. Cardiomegaly with pulmonary vascular congestion. Possible mild interstitial edema. No pneumothorax. Median sternotomy. Left subclavian pacemaker. Left IJ venous catheter terminates in the lower SVC. IMPRESSION: Endotracheal tube terminates 6 cm above the carina. Cardiomegaly with mild  interstitial edema and possible small left pleural effusion. Left IJ venous catheter terminates in the lower SVC. Electronically Signed   By: Julian Hy M.D.   On: 07/06/2015 17:42   Dg Chest Port 1 View  07/06/2015  CLINICAL DATA:  Status post CABG on July 03, 2015, dialysis dependent renal failure, pacemaker. EXAM: PORTABLE CHEST 1 VIEW COMPARISON:  Portable chest x-rays of October 26 and 03/28/2015 FINDINGS: The lungs are slightly better inflated today. The interstitial markings remain increased. There is left basilar atelectasis. A there is a small left pleural effusion. The cardiac silhouette is enlarged. The central pulmonary vascularity is more prominent today. The right internal jugular [place] sheath tip projects over the proximal SVC. There are 6 intact sternal wires visible on today's study. The permanent pacemaker is in reasonable position. IMPRESSION: Increased prominence of the pulmonary vascularity and pulmonary interstitium since yesterday's study is consistent with slight worsening of CHF. There is persistent left lower lobe atelectasis and small left pleural effusion. Electronically Signed   By: David  Martinique M.D.   On: 07/06/2015 07:31   US Abdomen Limited Ruq  07/07/2015  CLINICAL DATA:  Transaminitis, status post CABG on 06/29/2015 EXAM: US ABDOMEN LIMITED - RIGHT UPPER QUADRANT COMPARISON:  CT abdomen pelvis dated 01/17/2013 FINDINGS: Gallbladder: Gallbladder sludge. No gallstones, gallbladder wall thickening, or pericholecystic fluid. Sonographic Murphy's sign could not be assessed. Common bile duct: Diameter: 6 mm Liver: Hyperechoic hepatic parenchyma, suggesting hepatic steatosis. No focal hepatic lesion is seen. Additional comments:  Right pleural effusion. IMPRESSION: Gallbladder sludge, without associated sonographic findings to suggest acute cholecystitis. Suspected hepatic steatosis. Right pleural effusion. Electronically Signed   By: Julian Hy M.D.   On:  07/07/2015 17:08   Recent Results (from the past 240 hour(s))  Surgical pcr screen     Status: Abnormal   Collection Time: 07/02/15 10:12 PM  Result Value Ref Range Status   MRSA, PCR NEGATIVE NEGATIVE Final   Staphylococcus aureus POSITIVE (A) NEGATIVE Final    Comment:        The Xpert SA Assay (FDA approved for NASAL specimens in patients over 37 years of age), is one component of a comprehensive surveillance program.  Test performance has been validated by [facility] for patients greater than or equal to 74 year old. It is not intended to diagnose infection nor to guide or monitor treatment.   Culture, blood (routine x 2)     Status: None (Preliminary result)   Collection Time: 07/06/15  1:30 PM  Result Value Ref Range Status   Specimen Description BLOOD HEMODIALYSIS LINE  Final   Special Requests BOTTLES DRAWN AEROBIC AND ANAEROBIC 5CC  Final   Culture  Setup Time   Final    GRAM NEGATIVE RODS ANAEROBIC BOTTLE ONLY CRITICAL RESULT CALLED TO, READ BACK BY AND VERIFIED WITH: N ZAFIRIS,RN AT 1158 07/07/15 BY L BENFIELD    Culture GRAM NEGATIVE RODS  Final  Report Status PENDING  Incomplete  Culture, blood (routine x 2)     Status: None (Preliminary result)   Collection Time: 07/06/15  2:30 PM  Result Value Ref Range Status   Specimen Description BLOOD HEMODIALYSIS LINE  Final   Special Requests BOTTLES DRAWN AEROBIC AND ANAEROBIC 5CC  Final   Culture  Setup Time   Final    GRAM NEGATIVE RODS IN BOTH AEROBIC AND ANAEROBIC BOTTLES CRITICAL RESULT CALLED TO, READ BACK BY AND VERIFIED WITH: N ZAFIRIS,RN AT 1317 07/07/15 BY L BENFIELD    Culture GRAM NEGATIVE RODS  Final   Report Status PENDING  Incomplete  Culture, Urine     Status: None (Preliminary result)   Collection Time: 07/06/15  3:09 PM  Result Value Ref Range Status   Specimen Description URINE, CATHETERIZED  Final   Special Requests none Immunocompromised  Final   Culture >=100,000 COLONIES/mL PSEUDOMONAS  AERUGINOSA  Final   Report Status PENDING  Incomplete  Stat Gram stain     Status: None   Collection Time: 07/07/15  9:37 AM  Result Value Ref Range Status   Specimen Description STERNUM  Final   Special Requests Normal  Final   Gram Stain   Final    RARE WBC PRESENT, PREDOMINANTLY PMN NO ORGANISMS SEEN    Report Status 07/07/2015 FINAL  Final  C difficile quick scan w PCR reflex     Status: None   Collection Time: 07/07/15  1:53 PM  Result Value Ref Range Status   C Diff antigen NEGATIVE NEGATIVE Final   C Diff toxin NEGATIVE NEGATIVE Final   C Diff interpretation Negative for toxigenic C. difficile  Final      07/07/2015, 5:48 PM     LOS: 5 days    Records and images were personally reviewed where available.

## 2015-07-07 NOTE — Progress Notes (Addendum)
[address]       [place] [address]             [phone]        CARDIOTHORACIC SURGERY PROGRESS NOTE   R4 Days Post-Op Procedure(s) (LRB): CORONARY ARTERY BYPASS GRAFTING (CABG) times two using left internal mammary artery and right saphenous leg vein.  [place] to LAD and SVG to distal circumflex. (N/A) TRANSESOPHAGEAL ECHOCARDIOGRAM (TEE) (N/A)  Subjective: Sedated on vent.  Objective: Vital signs: BP Readings from Last 1 Encounters:  07/07/15 147/37   Pulse Readings from Last 1 Encounters:  07/07/15 73   Resp Readings from Last 1 Encounters:  07/07/15 24   Temp Readings from Last 1 Encounters:  07/07/15 102.5 F (39.2 C) Axillary    Hemodynamics: CVP:  [6 mmHg-8 mmHg] 8 mmHg  Physical Exam:  Rhythm:   sinus  Breath sounds: Coarse but clear  Heart sounds:  RRR  Incisions:  Serosanguinous drainage from inferior sternal incision  Abdomen:  Soft, non-distended, + watery stool  Extremities:  Warm, well-perfused   Intake/Output from previous day: 10/28 0701 - 10/29 0700 In: 2895.6 [I.V.:1225.9; NG/GT:359.7; IV Piggyback:1250] Out: 3000  Intake/Output this shift:    Lab Results:  CBC: Recent Labs  07/06/15 0410 07/07/15 0416  WBC 7.8 12.4*  HGB 8.1* 8.0*  HCT 24.8* 24.0*  PLT 118* 166    BMET:  Recent Labs  07/06/15 0410 07/07/15 0416  NA 140 140  K 4.8 4.3  CL 98* 100*  CO2 28 25  GLUCOSE 141* 166*  BUN 32* 30*  CREATININE 6.67* 5.21*  CALCIUM 10.0 9.6     PT/INR:  No results for input(s): LABPROT, INR in the last 72 hours.  CBG (last 3)   Recent Labs  07/06/15 2340 07/07/15 0355 07/07/15 0744  GLUCAP 133* 152* 160*    ABG    Component Value Date/Time   PHART 7.535* 07/07/2015 0040   PCO2ART 33.2* 07/07/2015 0040   PO2ART 70.0* 07/07/2015 0040   HCO3 28.0* 07/07/2015 0040   TCO2 29 07/07/2015 0040   ACIDBASEDEF 5.0* 06/29/2015 2316   O2SAT 96.0 07/07/2015 0040    CXR: PORTABLE CHEST 1  VIEW  COMPARISON: 07/06/2015 and 07/05/2015  FINDINGS: Endotracheal tube, central venous catheter and feeding tube appear in good position.  New slight atelectasis/ effusion at the right base. Increased atelectasis and probable small effusion at the left base.  Heart size and pulmonary vascularity are unchanged and normal.  IMPRESSION: Atelectasis and small effusions, new on the right and increased on the left.   Electronically Signed  By: Lorriane Shire M.D.  On: 07/07/2015 08:14  Assessment/Plan: S/P Procedure(s) (LRB): CORONARY ARTERY BYPASS GRAFTING (CABG) times two using left internal mammary artery and right saphenous leg vein.  [place] to LAD and SVG to distal circumflex. (N/A) TRANSESOPHAGEAL ECHOCARDIOGRAM (TEE) (N/A)  Maintaining NSR but patient remains hypotensive on IV Neo drip Oxygenation stable w/ O2 sats 96-98% on 40% FiO2, CXR relatively clear w/ mild pulm vascular congestion and bibasilar atelectasis Febrile last 24 hours w/ WBC increased 12,400 this morning, new serosanguinous drainage from sternal incision Expected post op acute blood loss anemia with pre-existing chronic anemia Chronic combined systolic and diastolic CHF with expected post op volume excess Stage V CKD - dialysis-dependent Elevated transaminases - likely due to hypoperfusion and passive hepatic congestion   Transfuse 2 units PRBC's  Consider starting CRRT versus intermittent HD - defer to Nephrology team  Culture sternal  drainage and f/u previous cultures  Continue empiric Vanc + Fortaz  Stool for C diff  Continue amiodarone  Monitor LFT's and hold statin  Vent + sedation per Pulm/CCM team   Rexene Alberts, MD 07/07/2015 9:22 AM

## 2015-07-07 NOTE — Progress Notes (Signed)
Bedside EEG completed, results pending. 

## 2015-07-07 NOTE — Progress Notes (Signed)
PULMONARY / CRITICAL CARE MEDICINE   Name: Antonio Page MRN: [id number] DOB: 1940-11-04    ADMISSION DATE:  06/26/2015 CONSULTATION DATE:  07/06/2015  REFERRING MD :  Lanelle Bal, M.D.  CHIEF COMPLAINT:  Obtundation & Acute Hypoxic Respiratory Failure  INITIAL PRESENTATION: 74 year old male with known history of diabetes mellitus and coronary artery disease as well as end-stage renal disease on hemodialysis who last underwent coronary artery stent placement to the RCA in 1999. Patient presented to Hospital and was admitted for angina. Given the severity of his underlying coronary artery disease Dr. Servando Snare took the patient to surgery for bypass on 10/25.  STUDIES:  CT HEAD W/O 10/28 - No acute abnormalities. Chronic microvascular ischemic changes. PORTABLE CXR 10/28 - personally reviewed by me shows a minimal left lower lobe infiltrate. Good positioning of endotracheal tube. Blunting of left costophrenic angle.  EEG - PENDING  SIGNIFICANT EVENTS: 10/20 - Admitted to Hospital 10/25 - CABG 10/26 - Extubation 10/28 - New Onset Atrial fibrillation 10/28 - Intubated for obtunded status & acute hypoxic respiratory failure  SUBJECTIVE: Patient progressively became more hypotensive requiring vasopressor support. Seems to have developed discharge from sternal surgical incision that was cultured. Patient has also developed watery diarrhea.  REVIEW OF SYSTEMS:  Unable to obtain as patient is currently intubated and sedated.  VITAL SIGNS: Temp:  [98.9 F (37.2 C)-102.8 F (39.3 C)] 101.7 F (38.7 C) (10/29 1202) Pulse Rate:  [28-96] 74 (10/29 1132) Resp:  [13-31] 26 (10/29 1132) BP: (74-151)/(31-79) 147/37 mmHg (10/29 0744) SpO2:  [83 %-100 %] 100 % (10/29 1132) Arterial Line BP: (89-148)/(26-65) 122/32 mmHg (10/29 0700) FiO2 (%):  [40 %-100 %] 40 % (10/29 1226) Weight:  [271 lb 2.7 oz (123 kg)] 271 lb 2.7 oz (123 kg) (10/29 0500) HEMODYNAMICS: CVP:  [6 mmHg-8 mmHg] 8  mmHg VENTILATOR SETTINGS: Vent Mode:  [-] PRVC FiO2 (%):  [40 %-100 %] 40 % Set Rate:  [16 bmp] 16 bmp Vt Set:  [600 mL] 600 mL PEEP:  [5 cmH20] 5 cmH20 Pressure Support:  [10 cmH20] 10 cmH20 Plateau Pressure:  [19 cmH20-27 cmH20] 27 cmH20 INTAKE / OUTPUT:  Intake/Output Summary (Last 24 hours) at 07/07/15 1327 Last data filed at 07/07/15 0600  Gross per 24 hour  Intake 2077.99 ml  Output   3000 ml  Net -922.01 ml    PHYSICAL EXAMINATION: General:  Sedated. No acute distress. Remains on ventilator Integument:  Warm & dry. No rash. Left internal jugular venous catheter in place. Sternal wound incision with drainage.  HEENT:  Endotracheal tube in place. Cloudy right sclera. No scleral injection. Cardiovascular:  Regular rate. No edema.  Pulmonary:  Coarse breath sounds bilaterally. Symmetric chest wall rise on ventilator. Abdomen: Soft. Normal bowel sounds. Protuberant. Rectal Foley in place. Musculoskeletal:  No joint deformity or effusion appreciated. Neurological:  Sedated. Left pupil round.   LABS:  CBC  Recent Labs Lab 07/05/15 0430 07/06/15 0410 07/07/15 0416  WBC 13.1* 7.8 12.4*  HGB 8.6* 8.1* 8.0*  HCT 26.4* 24.8* 24.0*  PLT 105* 118* 166   Coag's  Recent Labs Lab 07/04/2015 0645 06/28/2015 1350  APTT 32 34  INR 1.10 1.44   BMET  Recent Labs Lab 07/05/15 0430 07/06/15 0410 07/07/15 0416  NA 135  135 140 140  K 5.5*  5.4* 4.8 4.3  CL 98*  99* 98* 100*  CO2 25  26 28 25   BUN 52*  50* 32* 30*  CREATININE 9.08*  8.73* 6.67* 5.21*  GLUCOSE 159*  156* 141* 166*   Electrolytes  Recent Labs Lab 07/04/15 1610 07/05/15 0430 07/06/15 0410 07/07/15 0416  CALCIUM 9.4 9.8  9.7 10.0 9.6  MG 1.9  --  1.8 1.7  PHOS 5.8* 6.8* 6.0* 3.0   Sepsis Markers  Recent Labs Lab 07/06/15 1430 07/06/15 1902 07/07/15 0416  LATICACIDVEN 1.2  --   --   PROCALCITON  --  13.03 25.81   ABG  Recent Labs Lab 07/06/15 1707 07/06/15 1812 07/07/15 0040   PHART 7.447 7.411 7.535*  PCO2ART 39.0 42.4 33.2*  PO2ART 32.0* 249.0* 70.0*   Liver Enzymes  Recent Labs Lab 07/05/15 0430 07/06/15 0410 07/07/15 0416  AST  --   --  511*  ALT  --   --  299*  ALKPHOS  --   --  62  BILITOT  --   --  0.6  ALBUMIN 2.6* 2.3* 1.9*   Cardiac Enzymes No results for input(s): TROPONINI, PROBNP in the last 168 hours. Glucose  Recent Labs Lab 07/06/15 1606 07/06/15 1957 07/06/15 2340 07/07/15 0355 07/07/15 0744 07/07/15 1200  GLUCAP 132* 141* 133* 152* 160* 195*    Imaging Dg Abd 1 View  07/06/2015  CLINICAL DATA:  Evaluate NG tube placement EXAM: ABDOMEN - 1 VIEW COMPARISON:  None. FINDINGS: The feeding tube tip appears to be in the projection of the proximal duodenum. Dilated loops of bowel noted within the upper abdomen. IMPRESSION: Tip of feeding tube is in the projection of the expected location of the proximal duodenum. Electronically Signed   By: Kerby Moors M.D.   On: 07/06/2015 15:15   Dg Chest Port 1 View  07/07/2015  CLINICAL DATA:  Acute hypoxic respiratory failure. CABG on 06/21/2015. EXAM: PORTABLE CHEST 1 VIEW COMPARISON:  07/06/2015 and 07/05/2015 FINDINGS: Endotracheal tube, central venous catheter and feeding tube appear in good position. New slight atelectasis/ effusion at the right base. Increased atelectasis and probable small effusion at the left base. Heart size and pulmonary vascularity are unchanged and normal. IMPRESSION: Atelectasis and small effusions, new on the right and increased on the left. Electronically Signed   By: Lorriane Shire M.D.   On: 07/07/2015 08:14   Dg Chest Port 1 View  07/06/2015  CLINICAL DATA:  Intubation, respiratory arrest following dialysis EXAM: PORTABLE CHEST 1 VIEW COMPARISON:  07/06/2015 FINDINGS: Endotracheal tube terminates 6 mm above the carina. Mild elevation of the left hemidiaphragm. Blunting of the left costophrenic angle, raise the possibility small left pleural effusion.  Cardiomegaly with pulmonary vascular congestion. Possible mild interstitial edema. No pneumothorax. Median sternotomy. Left subclavian pacemaker. Left IJ venous catheter terminates in the lower SVC. IMPRESSION: Endotracheal tube terminates 6 cm above the carina. Cardiomegaly with mild interstitial edema and possible small left pleural effusion. Left IJ venous catheter terminates in the lower SVC. Electronically Signed   By: Julian Hy M.D.   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 Diarrhea - Possible C diff.  P:   Protonix via tube Continuing tube feedings Checking right upper quadrant ultrasound  Trending LFTs daily  HEMATOLOGIC A:   Anemia of Chronic Disease - No signs of active bleeding. Hgb Stable. Thrombocytopenia - Improving.  P:  Daily CBC to trend Hgb & Platelets SCDs Heparin Ryan q8hr given ESRD  INFECTIOUS A:   Septic Shock HCAP GNR Bacteremia Possible C diff  P:  Procalcitonin Algorithm Trending leukocyte count  C diff (10/29)>>> Resp Ctx (10/28)>>> Blood Ctx (10/28)>>>GNR on stain Urine Ctx (10/28)>>>  Abx:  Vancomycin, start  date 10/25>>> Tressie Ellis, start date 10/28>>>  ENDOCRINE A:   H/O DM   P:   Continuing accuchecks q4hr Increasing sliding scale insulin coverage to moderate algorithm  NEUROLOGIC A:   Altered mental status - delirium versus toxic metabolic encephalopathy.Likely   P:   RASS goal: 0 to -1 Morphine IV when necessary Ativan IV when necessary Limiting propofol given hypotension   FAMILY  - Updates: Wife, children, & grandchild updated with family conference this afternoon.  - Inter-disciplinary family meet or Palliative Care meeting due by:  11/04   TODAY'S SUMMARY: 74 year old Caucasian male with known history of coronary artery disease, OSA, & end-stage renal disease on hemodialysis. Patient underwent coronary artery bypass graft on 10/25. Successfully extubated on 10/26. Worsening delirium versus toxic metabolic encephalopathy coupled with progressively worsening acute hypoxic respiratory failure prompted intubation on 10/28. Preliminary blood culture results show gram-negative rods. Procalcitonin is increasing consistent with septic picture. Also concerning is the drainage from the patient's sternal wound. Continuing on broad-spectrum antibiotics at this time. Patient's family were updated by me in conference this morning.  I spent a total of 40 minutes of critical care time caring for the patient, discussing the plan of care with the patient's family, & reviewing the patient's electronic medical record.  Sonia Baller Ashok Cordia, M.D. [facility] Pager:  [phone] After 3pm or if no response, call [phone] 07/07/2015, 1:27 PM

## 2015-07-07 NOTE — Progress Notes (Signed)
Subjective: Patient remains unresponsive.  On Diprovan for ventilator synchronization.  Objective: Current vital signs: BP 147/37 mmHg  Pulse 73  Temp(Src) 102.5 F (39.2 C) (Axillary)  Resp 24  Ht 5\' 11"  (1.803 m)  Wt 123 kg (271 lb 2.7 oz)  BMI 37.84 kg/m2  SpO2 100% Vital signs in last 24 hours: Temp:  [98.9 F (37.2 C)-102.8 F (39.3 C)] 102.5 F (39.2 C) (10/29 0748) Pulse Rate:  [28-96] 73 (10/29 0744) Resp:  [13-32] 24 (10/29 0744) BP: (74-159)/(31-79) 147/37 mmHg (10/29 0744) SpO2:  [83 %-100 %] 100 % (10/29 0744) Arterial Line BP: (89-148)/(26-65) 122/32 mmHg (10/29 0700) FiO2 (%):  [40 %-100 %] 40 % (10/29 0744) Weight:  [123 kg (271 lb 2.7 oz)-129.4 kg (285 lb 4.4 oz)] 123 kg (271 lb 2.7 oz) (10/29 0500)  Intake/Output from previous day: 10/28 0701 - 10/29 0700 In: 2895.6 [I.V.:1225.9; NG/GT:359.7; IV Piggyback:1250] Out: 3000  Intake/Output this shift:   Nutritional status: Diet NPO time specified  Neurologic Exam: Mental Status: Keeps eyes closed.  No response to deep sternal rub.  Does not follow commands.  Cranial Nerves: II: No blink to threat, left pupil round 78mm minimally reactive to light , right pupil difficult to visualize due to significant scaring III,IV, VI: No response to oculocephalic maneuver.  V,VII: face symmetric, absent corneals bilateally VIII: unable to test IX/X: unable to test XI: bilateral shoulder shrug XII: unable to test Motor: Extremities flaccid Sensory: No response to noxious stimuli in any extremity Plantars: Mute bilaterally   Lab Results: Basic Metabolic Panel:  Recent Labs Lab 06/20/2015 0550  06/16/2015 2015  07/04/15 0504 07/04/15 1610 07/04/15 1612 07/05/15 0430 07/06/15 0410 07/07/15 0416  NA 134*  < >  --   < > 134* 139 137 135  135 140 140  K 5.2*  < >  --   < > 6.7* 5.4* 5.2* 5.5*  5.4* 4.8 4.3  CL 96*  < >  --   < > 102 103 101 98*  99* 98* 100*  CO2 27  --   --   --  19* 23  --  25  26 28  25   GLUCOSE 140*  < >  --   < > 191* 148* 147* 159*  156* 141* 166*  BUN 56*  < >  --   < > 63* 43* 40* 52*  50* 32* 30*  CREATININE 9.79*  < > 9.69*  < > 10.35* 7.77*  7.42* 7.70* 9.08*  8.73* 6.67* 5.21*  CALCIUM 9.5  --   --   --  9.0 9.4  --  9.8  9.7 10.0 9.6  MG  --   --  1.9  --  2.1 1.9  --   --  1.8 1.7  PHOS 5.3*  --   --   --   --  5.8*  --  6.8* 6.0* 3.0  < > = values in this interval not displayed.  Liver Function Tests:  Recent Labs Lab 07/09/2015 0550 07/04/15 1610 07/05/15 0430 07/06/15 0410 07/07/15 0416  AST  --   --   --   --  511*  ALT  --   --   --   --  299*  ALKPHOS  --   --   --   --  62  BILITOT  --   --   --   --  0.6  PROT  --   --   --   --  4.8*  ALBUMIN 3.4* 2.7* 2.6* 2.3* 1.9*   No results for input(s): LIPASE, AMYLASE in the last 168 hours.  Recent Labs Lab 07/06/15 1430  AMMONIA 20    CBC:  Recent Labs Lab 07/04/15 0504 07/04/15 1610 07/04/15 1612 07/05/15 0430 07/06/15 0410 07/07/15 0416  WBC 16.4* 14.4*  --  13.1* 7.8 12.4*  HGB 8.9* 9.1* 9.5* 8.6* 8.1* 8.0*  HCT 27.8* 26.6* 28.0* 26.4* 24.8* 24.0*  MCV 98.6 96.7  --  97.4 98.0 98.8  PLT 111* 94*  --  105* 118* 166    Cardiac Enzymes: No results for input(s): CKTOTAL, CKMB, CKMBINDEX, TROPONINI in the last 168 hours.  Lipid Panel:  Recent Labs Lab 07/07/15 0416  TRIG 102    CBG:  Recent Labs Lab 07/06/15 1606 07/06/15 1957 07/06/15 2340 07/07/15 0355 07/07/15 0744  GLUCAP 132* 141* 133* 152* 160*    Microbiology: Results for orders placed or performed during the hospital encounter of 06/16/2015  Surgical pcr screen     Status: Abnormal   Collection Time: 07/02/15 10:12 PM  Result Value Ref Range Status   MRSA, PCR NEGATIVE NEGATIVE Final   Staphylococcus aureus POSITIVE (A) NEGATIVE Final    Comment:        The Xpert SA Assay (FDA approved for NASAL specimens in patients over 16 years of age), is one component of a comprehensive  surveillance program.  Test performance has been validated by [facility] for patients greater than or equal to 36 year old. It is not intended to diagnose infection nor to guide or monitor treatment.   Culture, blood (routine x 2)     Status: None (Preliminary result)   Collection Time: 07/06/15  1:30 PM  Result Value Ref Range Status   Specimen Description BLOOD HEMODIALYSIS LINE  Final   Special Requests BOTTLES DRAWN AEROBIC AND ANAEROBIC 5CC  Final   Culture PENDING  Incomplete   Report Status PENDING  Incomplete  Culture, blood (routine x 2)     Status: None (Preliminary result)   Collection Time: 07/06/15  2:30 PM  Result Value Ref Range Status   Specimen Description BLOOD HEMODIALYSIS LINE  Final   Special Requests BOTTLES DRAWN AEROBIC AND ANAEROBIC 5CC  Final   Culture PENDING  Incomplete   Report Status PENDING  Incomplete    Coagulation Studies: No results for input(s): LABPROT, INR in the last 72 hours.  Imaging: Dg Abd 1 View  07/06/2015  CLINICAL DATA:  Evaluate NG tube placement EXAM: ABDOMEN - 1 VIEW COMPARISON:  None. FINDINGS: The feeding tube tip appears to be in the projection of the proximal duodenum. Dilated loops of bowel noted within the upper abdomen. IMPRESSION: Tip of feeding tube is in the projection of the expected location of the proximal duodenum. Electronically Signed   By: Kerby Moors M.D.   On: 07/06/2015 15:15   Ct Head Wo Contrast  07/06/2015  CLINICAL DATA:  74 year old male with worsening confusion. EXAM: CT HEAD WITHOUT CONTRAST TECHNIQUE: Contiguous axial images were obtained from the base of the skull through the vertex without intravenous contrast. COMPARISON:  Head CT 01/2015. FINDINGS: Small sclerotic right globe. Patchy and confluent areas of decreased attenuation are noted throughout the deep and periventricular white matter of the cerebral hemispheres bilaterally, compatible with chronic microvascular ischemic disease. No acute  intracranial abnormalities. Specifically, no evidence of acute intracranial hemorrhage, no definite findings of acute/subacute cerebral ischemia, no mass, mass effect, hydrocephalus or abnormal intra or extra-axial fluid  collections. Visualized paranasal sinuses and mastoids are well pneumatized. No acute displaced skull fractures are identified. IMPRESSION: 1. No acute intracranial abnormalities. 2. Chronic microvascular ischemic changes in cerebral white matter as above. Electronically Signed   By: Vinnie Langton M.D.   On: 07/06/2015 02:07   Ir Fluoro Guide Cv Line Left  07/06/2015  CLINICAL DATA:  Status post CABG. A right jugular sheath niece to be removed due to fever and additional central venous access has been requested. EXAM: NON-TUNNELED CENTRAL VENOUS CATHETER PLACEMENT WITH ULTRASOUND AND FLUOROSCOPIC GUIDANCE FLUOROSCOPY TIME:  24 seconds. PROCEDURE: The procedure, risks, benefits, and alternatives were explained to the patient's wife. Questions regarding the procedure were encouraged and answered. The patient's wife understands and consents to the procedure. A time-out was performed prior to the procedure. The left neck and chest were prepped with chlorhexidine in a sterile fashion, and a sterile drape was applied covering the operative field. Maximum barrier sterile technique with sterile gowns and gloves were used for the procedure. Local anesthesia was provided with 1% lidocaine. Ultrasound was used to confirm patency of the left internal jugular vein. After creating a small venotomy incision, a 21 gauge needle was advanced into the left internal jugular vein under direct, real-time ultrasound guidance. Ultrasound image documentation was performed. After securing guidewire access, a peel-away sheath was placed over a guide wire. Utilizing guide wire measurement, a 6 [place], triple-lumen Power Line non tunneled catheter was cut to 26 cm. The catheter was then placed through the sheath and the  sheath removed. Final catheter positioning was confirmed and documented with a fluoroscopic spot image. The catheter was aspirated and flushed with saline. The catheter exit site was secured with 0-Prolene retention sutures. COMPLICATIONS: None.  No pneumothorax. FINDINGS: After catheter placement, the tip lies at the cavoatrial junction. The catheter aspirates normally and is ready for immediate use. IMPRESSION: Placement of non-tunneled central venous catheter via the left internal jugular vein. The catheter tip lies at the cavoatrial junction. The catheter is ready for immediate use. Electronically Signed   By: Aletta Edouard M.D.   On: 07/06/2015 13:25   Ir US Guide Vasc Access Left  07/06/2015  CLINICAL DATA:  Status post CABG. A right jugular sheath niece to be removed due to fever and additional central venous access has been requested. EXAM: NON-TUNNELED CENTRAL VENOUS CATHETER PLACEMENT WITH ULTRASOUND AND FLUOROSCOPIC GUIDANCE FLUOROSCOPY TIME:  24 seconds. PROCEDURE: The procedure, risks, benefits, and alternatives were explained to the patient's wife. Questions regarding the procedure were encouraged and answered. The patient's wife understands and consents to the procedure. A time-out was performed prior to the procedure. The left neck and chest were prepped with chlorhexidine in a sterile fashion, and a sterile drape was applied covering the operative field. Maximum barrier sterile technique with sterile gowns and gloves were used for the procedure. Local anesthesia was provided with 1% lidocaine. Ultrasound was used to confirm patency of the left internal jugular vein. After creating a small venotomy incision, a 21 gauge needle was advanced into the left internal jugular vein under direct, real-time ultrasound guidance. Ultrasound image documentation was performed. After securing guidewire access, a peel-away sheath was placed over a guide wire. Utilizing guide wire measurement, a 6 [place],  triple-lumen Power Line non tunneled catheter was cut to 26 cm. The catheter was then placed through the sheath and the sheath removed. Final catheter positioning was confirmed and documented with a fluoroscopic spot image. The catheter was aspirated and flushed with  saline. The catheter exit site was secured with 0-Prolene retention sutures. COMPLICATIONS: None.  No pneumothorax. FINDINGS: After catheter placement, the tip lies at the cavoatrial junction. The catheter aspirates normally and is ready for immediate use. IMPRESSION: Placement of non-tunneled central venous catheter via the left internal jugular vein. The catheter tip lies at the cavoatrial junction. The catheter is ready for immediate use. Electronically Signed   By: Aletta Edouard M.D.   On: 07/06/2015 13:25   Dg Chest Port 1 View  07/07/2015  CLINICAL DATA:  Acute hypoxic respiratory failure. CABG on 06/18/2015. EXAM: PORTABLE CHEST 1 VIEW COMPARISON:  07/06/2015 and 07/05/2015 FINDINGS: Endotracheal tube, central venous catheter and feeding tube appear in good position. New slight atelectasis/ effusion at the right base. Increased atelectasis and probable small effusion at the left base. Heart size and pulmonary vascularity are unchanged and normal. IMPRESSION: Atelectasis and small effusions, new on the right and increased on the left. Electronically Signed   By: Lorriane Shire M.D.   On: 07/07/2015 08:14   Dg Chest Port 1 View  07/06/2015  CLINICAL DATA:  Intubation, respiratory arrest following dialysis EXAM: PORTABLE CHEST 1 VIEW COMPARISON:  07/06/2015 FINDINGS: Endotracheal tube terminates 6 mm above the carina. Mild elevation of the left hemidiaphragm. Blunting of the left costophrenic angle, raise the possibility small left pleural effusion. Cardiomegaly with pulmonary vascular congestion. Possible mild interstitial edema. No pneumothorax. Median sternotomy. Left subclavian pacemaker. Left IJ venous catheter terminates in the lower  SVC. IMPRESSION: Endotracheal tube terminates 6 cm above the carina. Cardiomegaly with mild interstitial edema and possible small left pleural effusion. Left IJ venous catheter terminates in the lower SVC. Electronically Signed   By: Julian Hy M.D.   On: 07/06/2015 17:42   Dg Chest Port 1 View  07/06/2015  CLINICAL DATA:  Status post CABG on July 03, 2015, dialysis dependent renal failure, pacemaker. EXAM: PORTABLE CHEST 1 VIEW COMPARISON:  Portable chest x-rays of October 26 and 03/28/2015 FINDINGS: The lungs are slightly better inflated today. The interstitial markings remain increased. There is left basilar atelectasis. A there is a small left pleural effusion. The cardiac silhouette is enlarged. The central pulmonary vascularity is more prominent today. The right internal jugular Cordis sheath tip projects over the proximal SVC. There are 6 intact sternal wires visible on today's study. The permanent pacemaker is in reasonable position. IMPRESSION: Increased prominence of the pulmonary vascularity and pulmonary interstitium since yesterday's study is consistent with slight worsening of CHF. There is persistent left lower lobe atelectasis and small left pleural effusion. Electronically Signed   By: David  Martinique M.D.   On: 07/06/2015 07:31    Medications:  I have reviewed the patient's current medications. Scheduled: . antiseptic oral rinse  7 mL Mouth Rinse q12n4p  . aspirin EC  325 mg Oral Daily   Or  . aspirin  324 mg Per Tube Daily  . chlorhexidine  15 mL Mouth Rinse BID  . Chlorhexidine Gluconate Cloth  6 each Topical Daily  . darbepoetin (ARANESP) injection - DIALYSIS  60 mcg Intravenous Q Mon-HD  . heparin subcutaneous  5,000 Units Subcutaneous 3 times per day  . insulin aspart  0-9 Units Subcutaneous 6 times per day  . magnesium sulfate  4 g Intravenous Once  . metoprolol tartrate  12.5 mg Oral BID   Or  . metoprolol tartrate  12.5 mg Per Tube BID  . mupirocin ointment  1  application Nasal BID  . pantoprazole sodium  40 mg Per Tube Daily  . sodium chloride  3 mL Intravenous Q12H  . sucroferric oxyhydroxide  1,000 mg Oral TID WC  . vancomycin  1,000 mg Intravenous Q M,W,F-HD    Assessment/Plan: Patient now on [place].  Remains unresponsive.    Recommendations: 1.  EEG 2.  Repeat head CT in AM.  Patient unable to have MRI   LOS: 5 days   Alexis Goodell, MD [facility] [phone] 07/07/2015  9:47 AM

## 2015-07-08 ENCOUNTER — Inpatient Hospital Stay (HOSPITAL_COMMUNITY): Payer: Medicare Other

## 2015-07-08 DIAGNOSIS — Z515 Encounter for palliative care: Secondary | ICD-10-CM

## 2015-07-08 DIAGNOSIS — K559 Vascular disorder of intestine, unspecified: Secondary | ICD-10-CM

## 2015-07-08 DIAGNOSIS — Z66 Do not resuscitate: Secondary | ICD-10-CM

## 2015-07-08 DIAGNOSIS — G9340 Encephalopathy, unspecified: Secondary | ICD-10-CM

## 2015-07-08 DIAGNOSIS — R410 Disorientation, unspecified: Secondary | ICD-10-CM

## 2015-07-08 DIAGNOSIS — N186 End stage renal disease: Secondary | ICD-10-CM

## 2015-07-08 DIAGNOSIS — E872 Acidosis, unspecified: Secondary | ICD-10-CM

## 2015-07-08 DIAGNOSIS — Z951 Presence of aortocoronary bypass graft: Secondary | ICD-10-CM | POA: Insufficient documentation

## 2015-07-08 DIAGNOSIS — A4152 Sepsis due to Pseudomonas: Secondary | ICD-10-CM

## 2015-07-08 LAB — RENAL FUNCTION PANEL
ALBUMIN: 1.4 g/dL — AB (ref 3.5–5.0)
Anion gap: 29 — ABNORMAL HIGH (ref 5–15)
BUN: 49 mg/dL — AB (ref 6–20)
CALCIUM: 8.3 mg/dL — AB (ref 8.9–10.3)
CO2: 18 mmol/L — ABNORMAL LOW (ref 22–32)
Chloride: 88 mmol/L — ABNORMAL LOW (ref 101–111)
Creatinine, Ser: 7.38 mg/dL — ABNORMAL HIGH (ref 0.61–1.24)
GFR, EST AFRICAN AMERICAN: 7 mL/min — AB (ref >60)
GFR, EST NON AFRICAN AMERICAN: 6 mL/min — AB (ref >60)
Glucose, Bld: 133 mg/dL — ABNORMAL HIGH (ref 65–99)
PHOSPHORUS: 11.4 mg/dL — AB (ref 2.5–4.6)
POTASSIUM: 5.8 mmol/L — AB (ref 3.5–5.1)
Sodium: 135 mmol/L (ref 135–145)

## 2015-07-08 LAB — POCT I-STAT 3, ART BLOOD GAS (G3+)
ACID-BASE DEFICIT: 7 mmol/L — AB (ref 0.0–2.0)
Acid-base deficit: 10 mmol/L — ABNORMAL HIGH (ref 0.0–2.0)
BICARBONATE: 16.7 meq/L — AB (ref 20.0–24.0)
Bicarbonate: 18.2 mEq/L — ABNORMAL LOW (ref 20.0–24.0)
O2 SAT: 94 %
O2 Saturation: 87 %
PCO2 ART: 44.8 mmHg (ref 35.0–45.0)
PH ART: 7.348 — AB (ref 7.350–7.450)
PO2 ART: 73 mmHg — AB (ref 80.0–100.0)
Patient temperature: 101.9
TCO2: 19 mmol/L (ref 0–100)
TCO2: 18 mmol/L (ref 0–100)
pCO2 arterial: 33.1 mmHg — ABNORMAL LOW (ref 35.0–45.0)
pH, Arterial: 7.19 — CL (ref 7.350–7.450)
pO2, Arterial: 75 mmHg — ABNORMAL LOW (ref 80.0–100.0)

## 2015-07-08 LAB — GLUCOSE, CAPILLARY
GLUCOSE-CAPILLARY: 135 mg/dL — AB (ref 65–99)
GLUCOSE-CAPILLARY: 148 mg/dL — AB (ref 65–99)
GLUCOSE-CAPILLARY: 65 mg/dL (ref 65–99)
GLUCOSE-CAPILLARY: 90 mg/dL (ref 65–99)
Glucose-Capillary: 88 mg/dL (ref 65–99)
Glucose-Capillary: 101 mg/dL — ABNORMAL HIGH (ref 65–99)
Glucose-Capillary: 46 mg/dL — ABNORMAL LOW (ref 65–99)
Glucose-Capillary: 52 mg/dL — ABNORMAL LOW (ref 65–99)
Glucose-Capillary: 69 mg/dL (ref 65–99)

## 2015-07-08 LAB — BLOOD GAS, ARTERIAL
Acid-base deficit: 17.3 mmol/L — ABNORMAL HIGH (ref 0.0–2.0)
Bicarbonate: 10.9 mEq/L — ABNORMAL LOW (ref 20.0–24.0)
DRAWN BY: 270221
FIO2: 100
O2 Saturation: 91.3 %
PEEP: 10 cmH2O
Patient temperature: 98.6
RATE: 16 resp/min
TCO2: 12.2 mmol/L (ref 0–100)
VT: 600 mL
pCO2 arterial: 40.3 mmHg (ref 35.0–45.0)
pH, Arterial: 7.062 — CL (ref 7.350–7.450)
pO2, Arterial: 95.9 mmHg (ref 80.0–100.0)

## 2015-07-08 LAB — COMPREHENSIVE METABOLIC PANEL
ALK PHOS: 96 U/L (ref 38–126)
ALT: 661 U/L — AB (ref 17–63)
AST: 813 U/L — ABNORMAL HIGH (ref 15–41)
Albumin: 1.8 g/dL — ABNORMAL LOW (ref 3.5–5.0)
Anion gap: 25 — ABNORMAL HIGH (ref 5–15)
BILIRUBIN TOTAL: 0.9 mg/dL (ref 0.3–1.2)
BUN: 48 mg/dL — ABNORMAL HIGH (ref 6–20)
CALCIUM: 10.5 mg/dL — AB (ref 8.9–10.3)
CO2: 19 mmol/L — AB (ref 22–32)
CREATININE: 7.47 mg/dL — AB (ref 0.61–1.24)
Chloride: 93 mmol/L — ABNORMAL LOW (ref 101–111)
GFR calc non Af Amer: 6 mL/min — ABNORMAL LOW (ref >60)
GFR, EST AFRICAN AMERICAN: 7 mL/min — AB (ref >60)
GLUCOSE: 128 mg/dL — AB (ref 65–99)
Potassium: 5.2 mmol/L — ABNORMAL HIGH (ref 3.5–5.1)
SODIUM: 137 mmol/L (ref 135–145)
Total Protein: 5.1 g/dL — ABNORMAL LOW (ref 6.5–8.1)

## 2015-07-08 LAB — CBC
HCT: 27.6 % — ABNORMAL LOW (ref 39.0–52.0)
Hemoglobin: 8.8 g/dL — ABNORMAL LOW (ref 13.0–17.0)
MCH: 31.1 pg (ref 26.0–34.0)
MCHC: 31.9 g/dL (ref 30.0–36.0)
MCV: 97.5 fL (ref 78.0–100.0)
PLATELETS: 182 10*3/uL (ref 150–400)
RBC: 2.83 MIL/uL — ABNORMAL LOW (ref 4.22–5.81)
RDW: 15.9 % — ABNORMAL HIGH (ref 11.5–15.5)
WBC: 21.6 10*3/uL — ABNORMAL HIGH (ref 4.0–10.5)

## 2015-07-08 LAB — CG4 I-STAT (LACTIC ACID): LACTIC ACID, VENOUS: 16.47 mmol/L — AB (ref 0.5–2.0)

## 2015-07-08 LAB — LACTIC ACID, PLASMA: Lactic Acid, Venous: 16.5 mmol/L (ref 0.5–2.0)

## 2015-07-08 LAB — PROCALCITONIN: Procalcitonin: 109.02 ng/mL

## 2015-07-08 LAB — TRIGLYCERIDES: Triglycerides: 164 mg/dL — ABNORMAL HIGH (ref <150)

## 2015-07-08 LAB — MAGNESIUM: MAGNESIUM: 1.9 mg/dL (ref 1.7–2.4)

## 2015-07-08 LAB — PHOSPHORUS: Phosphorus: 5.9 mg/dL — ABNORMAL HIGH (ref 2.5–4.6)

## 2015-07-08 MED ORDER — ACETAMINOPHEN 650 MG RE SUPP
650.0000 mg | Freq: Four times a day (QID) | RECTAL | Status: DC | PRN
  Administered 2015-07-08: 650 mg via RECTAL
  Filled 2015-07-08: qty 1

## 2015-07-08 MED ORDER — EPINEPHRINE HCL 0.1 MG/ML IJ SOSY
PREFILLED_SYRINGE | INTRAMUSCULAR | Status: AC
  Administered 2015-07-08: 11:00:00
  Filled 2015-07-08: qty 10

## 2015-07-08 MED ORDER — PRISMASOL BGK 4/2.5 32-4-2.5 MEQ/L IV SOLN
INTRAVENOUS | Status: DC
  Filled 2015-07-08: qty 5000

## 2015-07-08 MED ORDER — DARBEPOETIN ALFA 200 MCG/0.4ML IJ SOSY: 200.0000 ug | PREFILLED_SYRINGE | INTRAMUSCULAR | Status: DC

## 2015-07-08 MED ORDER — SODIUM BICARBONATE 8.4 % IV SOLN
100.0000 meq | Freq: Once | INTRAVENOUS | Status: AC
  Administered 2015-07-08: 100 meq via INTRAVENOUS
  Filled 2015-07-08: qty 100

## 2015-07-08 MED ORDER — MORPHINE SULFATE 25 MG/ML IV SOLN
4.0000 mg/h | INTRAVENOUS | Status: DC
  Filled 2015-07-08: qty 10

## 2015-07-08 MED ORDER — PHENYLEPHRINE HCL 10 MG/ML IJ SOLN
30.0000 ug/min | INTRAVENOUS | Status: DC
  Filled 2015-07-08: qty 1

## 2015-07-08 MED ORDER — DEXTROSE 50 % IV SOLN
25.0000 mL | Freq: Once | INTRAVENOUS | Status: AC
  Administered 2015-07-08: 50 mL via INTRAVENOUS
  Filled 2015-07-08: qty 50

## 2015-07-08 MED ORDER — PRISMASOL BGK 4/2.5 32-4-2.5 MEQ/L IV SOLN
INTRAVENOUS | Status: DC
  Filled 2015-07-08 (×7): qty 5000

## 2015-07-08 MED ORDER — DEXTROSE 50 % IV SOLN
INTRAVENOUS | Status: AC
  Administered 2015-07-08: 25 mL via INTRAVENOUS
  Filled 2015-07-08: qty 50

## 2015-07-08 MED ORDER — PHENYLEPHRINE HCL 10 MG/ML IJ SOLN
30.0000 ug/min | INTRAMUSCULAR | Status: DC
  Administered 2015-07-08: 150 ug/min via INTRAVENOUS
  Filled 2015-07-08: qty 4

## 2015-07-08 MED ORDER — DEXTROSE 50 % IV SOLN
INTRAVENOUS | Status: AC
  Administered 2015-07-08: 25 mL
  Filled 2015-07-08: qty 50

## 2015-07-08 MED ORDER — PRISMASOL BGK 4/2.5 32-4-2.5 MEQ/L IV SOLN
INTRAVENOUS | Status: DC
  Administered 2015-07-08: 15:00:00 via INTRAVENOUS_CENTRAL
  Filled 2015-07-08 (×3): qty 5000

## 2015-07-08 MED ORDER — STERILE WATER FOR INJECTION IV SOLN
INTRAVENOUS | Status: DC
  Administered 2015-07-08: 11:00:00 via INTRAVENOUS
  Filled 2015-07-08 (×4): qty 850

## 2015-07-08 MED ORDER — HEPARIN SODIUM (PORCINE) 1000 UNIT/ML DIALYSIS
1000.0000 [IU] | INTRAMUSCULAR | Status: DC | PRN
  Administered 2015-07-08: 2400 [IU] via INTRAVENOUS_CENTRAL
  Filled 2015-07-08 (×2): qty 6

## 2015-07-08 MED ORDER — PRISMASOL BGK 4/2.5 32-4-2.5 MEQ/L IV SOLN: INTRAVENOUS | Status: DC

## 2015-07-08 MED ORDER — DEXTROSE 5 % IV SOLN
INTRAVENOUS | Status: DC
  Administered 2015-07-08: 12:00:00 via INTRAVENOUS

## 2015-07-08 MED ORDER — PHENYLEPHRINE HCL 10 MG/ML IJ SOLN
30.0000 ug/min | INTRAMUSCULAR | Status: DC
  Administered 2015-07-08: 75 ug/min via INTRAVENOUS
  Filled 2015-07-08: qty 1

## 2015-07-08 MED ORDER — DEXTROSE 50 % IV SOLN
25.0000 mL | Freq: Once | INTRAVENOUS | Status: AC
  Administered 2015-07-08 (×2): 25 mL via INTRAVENOUS

## 2015-07-08 MED ORDER — IOHEXOL 300 MG/ML  SOLN
100.0000 mL | Freq: Once | INTRAMUSCULAR | Status: AC | PRN
  Administered 2015-07-08: 100 mL via INTRAVENOUS

## 2015-07-08 MED ORDER — SODIUM CHLORIDE 0.9 % FOR CRRT
INTRAVENOUS_CENTRAL | Status: DC | PRN
  Filled 2015-07-08: qty 1000

## 2015-07-09 LAB — CULTURE, RESPIRATORY W GRAM STAIN: Special Requests: NORMAL

## 2015-07-09 LAB — URINE CULTURE: Culture: >=100000

## 2015-07-09 LAB — CULTURE, BLOOD (ROUTINE X 2)

## 2015-07-09 LAB — GLUCOSE, CAPILLARY: GLUCOSE-CAPILLARY: 40 mg/dL — AB (ref 65–99)

## 2015-07-10 LAB — WOUND CULTURE
CULTURE: NO GROWTH
Culture: NO GROWTH
Gram Stain: NONE SEEN
SPECIAL REQUESTS: NORMAL

## 2015-07-10 NOTE — Progress Notes (Signed)
[facility] Progress Note   Subjective:    S/p 2V CABG (LIMA to LAD, SVG to distal cx) 10/25 EVENTS of past 24 hours  Vomited yesterday, possible aspiration 800 from OGT probable ileus 2 pressors currently Septic shock with gram negative rods in 2/2 blood cultures from 10/28, urine + pseudomonas Antibiotics now levaquin, meropenem, vancomycin Procalcitonin 109 Sternal wound per RN draining serous with brown tinge to it - moderate quantities Worsening metabolic acidosis, rising K Obligatory fluid intake over 130 ml/hour   Physical Exam BP 153/39 mmHg  Pulse 89  Temp(Src) 103.7 F (39.8 C) (Oral)  Resp 20  Ht 5\' 11"  (1.803 m)  Wt 123 kg (271 lb 2.7 oz)  BMI 37.84 kg/m2  SpO2 97%  Intubated, not reponsive ETT, new left IJ line, OG tube, flexiseal. Anteriorly fairly clear Sternal wound dresssed No external drainage Pacemaker in place left side Abdomen: Protuberant. Quiet Extremities: 1+ woody LE edema LE's.  Right LE incision clean and dry.  Bruising R prox thigh Dialysis Access: RUA AVF + bruit   Additional Objective  Recent Labs Lab 07/06/15 0410 07/07/15 0416 July 16, 2015 0353  NA 140 140 137  K 4.8 4.3 5.2*  CL 98* 100* 93*  CO2 28 25 19*  GLUCOSE 141* 166* 128*  BUN 32* 30* 48*  CREATININE 6.67* 5.21* 7.47*  CALCIUM 10.0 9.6 10.5*  PHOS 6.0* 3.0 5.9*     Recent Labs Lab 07/04/15 1610  07/05/15 0430 07/06/15 0410 07/07/15 0416 07-16-2015 0353  WBC 14.4*  --  13.1* 7.8 12.4* 21.6*  HGB 9.1*  < > 8.6* 8.1* 8.0* 8.8*  HCT 26.6*  < > 26.4* 24.8* 24.0* 27.6*  MCV 96.7  --  97.4 98.0 98.8 97.5  PLT 94*  --  105* 118* 166 182  < > = values in this interval not displayed.   Recent Labs Lab 07/07/15 1200 07/07/15 1609 07/07/15 1959 Jul 16, 2015 0003 07/16/2015 0411  GLUCAP 195* 172* 163* 148* 88   Results for AQUARIUS, LATOUCHE (MRN [id number]) as of 07-16-15 09:28  Ref. Range 07/06/2015 19:02 07/07/2015 04:16 Jul 16, 2015 03:53   Procalcitonin Latest Units: ng/mL 13.03 25.81 109.02   ABG    Component Value Date/Time   PHART 7.190* 07/16/2015 0918   PCO2ART 44.8 16-Jul-2015 0918   PO2ART 73.0* 2015-07-16 0918   HCO3 16.7* 2015-07-16 0918   TCO2 18 2015-07-16 0918   ACIDBASEDEF 10.0* 07-16-15 0918   O2SAT 87.0 16-Jul-2015 0918    Cultures: Blood - 10/28 - GNR 2/2 cultures Sternal wound drainage - few WBC's no orgs, cx pending Urine - >100,000 colonies pseudomonas Resp - pending  Medications: Infusions: . sodium chloride 20 mL/hr at 07/07/15 1700  . sodium chloride Stopped (07/06/15 1400)  . sodium chloride 20 mL/hr at 07/16/15 0651  . amiodarone 30 mg/hr (Jul 16, 2015 0400)  . feeding supplement (NEPRO CARB STEADY) Stopped (07/07/15 1515)  . lactated ringers    . lactated ringers    . norepinephrine (LEVOPHED) Adult infusion 80 mcg/min (16-Jul-2015 0700)  . phenylephrine (NEO-SYNEPHRINE) Adult infusion Stopped (07/07/15 1149)  . propofol (DIPRIVAN) infusion Stopped (07/07/15 0930)  . vasopressin (PITRESSIN) infusion - *FOR SHOCK* 0.03 Units/min (Jul 16, 2015 0400)   Scheduled: . antiseptic oral rinse  7 mL Mouth Rinse q12n4p  . aspirin EC  325 mg Oral Daily   Or  . aspirin  324 mg Per Tube Daily  . chlorhexidine  15 mL Mouth Rinse BID  . Chlorhexidine Gluconate Cloth  6 each Topical Daily  .  darbepoetin (ARANESP) injection - DIALYSIS  60 mcg Intravenous Q Mon-HD  . dextrose      . feeding supplement (PRO-STAT SUGAR FREE 64)  60 mL Per Tube QID  . heparin subcutaneous  5,000 Units Subcutaneous 3 times per day  . insulin aspart  0-15 Units Subcutaneous 6 times per day  . levofloxacin (LEVAQUIN) IV  500 mg Intravenous Q48H  . magnesium sulfate  4 g Intravenous Once  . meropenem (MERREM) IV  500 mg Intravenous Q24H  . metoprolol tartrate  12.5 mg Oral BID   Or  . metoprolol tartrate  12.5 mg Per Tube BID  . pantoprazole sodium  40 mg Per Tube Daily  . sodium chloride  3 mL Intravenous Q12H  .  sucroferric oxyhydroxide  1,000 mg Oral TID WC  . vancomycin  1,000 mg Intravenous Q M,W,F-HD   Dialysis Orders: Center: [facility] on MWF . EDW 121.5 kg  HD Bath 2.0 K 2.0 Ca  Time 4 hours 15 minutes  Heparin 4000 Units per tx.  Access RUA AVF  BFR 500 DFR 800 manual  Mircera 50 mcg IV q 2 weeks (last dose 06/20/2015) Hectoral 5 mcg IV q MWF  Assessment/Recommendations: 1. CAD -  S/P 2 vessel CABG LIMA to LAD, SVG to distal cx 10/25.  2. Septic shock with GNR bacteremia, pseudomonas UTI. Possible sternal wound infection.  2 pressors. Levaquin, meropenem, vanco 3. PAF - IV amio 4. ESRD - MWF outpt. With septic shock, pressor dependent hypotension, and large obligatory fluid intake, as well as worsening metabolic acidosis, will not tolerate HD. Will require catheter placement for CRRT. Have spoken with CCM who will place line. Spoke with wife and updated.  Initiate no heparin CRRT. Keep vol even for now. 5. Metabolic acidosis - initiate isotonic sodium bicarbonate 6. VDRF - intubated 10/28. Bronch neg for mucus plugging.  7. Encephalopathy - Unresponsive. CT brain showed no acute intracranial abnormalities, chronic microvascular ischemic changes in cerebral white matter. Neuro following. 8. Elevated transaminases - Hypoperfusion/shock liver. 9. 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 Anemia - CKD + ABLA. Started darbe 60/week on 10/24.  Increase to 200  11. Metabolic bone disease -Stopped hectorol due to elev corrected Ca (binders/sensipar as outpt)  12. Nutrition - TF's 13. DM -  Per primary. 14. [place], MD [facility] [phone] Pager 2015/07/17, 9:31 AM

## 2015-07-10 NOTE — Progress Notes (Signed)
[address]       [place] [address]             [phone]        CARDIOTHORACIC SURGERY PROGRESS NOTE   R5 Days Post-Op Procedure(s) (LRB): CORONARY ARTERY BYPASS GRAFTING (CABG) times two using left internal mammary artery and right saphenous leg vein.  [place] to LAD and SVG to distal circumflex. (N/A) TRANSESOPHAGEAL ECHOCARDIOGRAM (TEE) (N/A)  Subjective: Sedated on vent.  Objective: Vital signs: BP Readings from Last 1 Encounters:  2015/07/24 153/39   Pulse Readings from Last 1 Encounters:  07-24-2015 79   Resp Readings from Last 1 Encounters:  2015-07-24 20   Temp Readings from Last 1 Encounters:  24-Jul-2015 103.7 F (39.8 C) Oral    Hemodynamics: CVP:  [8 mmHg-11 mmHg] 9 mmHg  Physical Exam:  Rhythm:   AV paced  Breath sounds: Coarse but clear  Heart sounds:  RRR  Incisions:  Clean and dry, scant serosanguinous drainage from inferior sternotomy incision  Abdomen:  Soft, non-distended  Extremities:  Cool peripherally but good cap refill   Intake/Output from previous day: 10/29 0701 - 10/30 0700 In: 4208.4 [I.V.:3573.7; NG/GT:484.8; IV Piggyback:150] Out: 1400 [Emesis/NG output:1300; Stool:100] Intake/Output this shift: Total I/O In: 468.9 [I.V.:468.9] Out: -   Lab Results:  CBC: Recent Labs  07/07/15 0416 Jul 24, 2015 0353  WBC 12.4* 21.6*  HGB 8.0* 8.8*  HCT 24.0* 27.6*  PLT 166 182    BMET:  Recent Labs  07/07/15 0416 July 24, 2015 0353  NA 140 137  K 4.3 5.2*  CL 100* 93*  CO2 25 19*  GLUCOSE 166* 128*  BUN 30* 48*  CREATININE 5.21* 7.47*  CALCIUM 9.6 10.5*     PT/INR:  No results for input(s): LABPROT, INR in the last 72 hours.  CBG (last 3)   Recent Labs  07/07/15 1959 07-24-15 0003 07/24/15 0411  GLUCAP 163* 148* 88    ABG    Component Value Date/Time   PHART 7.190* 07/24/15 0918   PCO2ART 44.8 2015/07/24 0918   PO2ART 73.0* 2015-07-24 0918   HCO3 16.7* Jul 24, 2015 0918   TCO2 18 07-24-15 0918   ACIDBASEDEF 10.0* 07-24-2015 0918   O2SAT 87.0 07-24-2015 0918    CXR: PORTABLE CHEST 1 VIEW  COMPARISON: 07/07/2015  FINDINGS: ET tube tip is above the carina. The feeding tube tip is of colo be GE junction. Normal heart size. No pleural effusion or edema. The lung volumes are low.  IMPRESSION: 1. Stable support apparatus. 2. Cardiac enlargement and low lung volumes.   Electronically Signed  By: Kerby Moors M.D.  On: 07-24-2015 08:23  Assessment/Plan: S/P Procedure(s) (LRB): CORONARY ARTERY BYPASS GRAFTING (CABG) times two using left internal mammary artery and right saphenous leg vein.  [place] to LAD and SVG to distal circumflex. 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

## 2015-07-10 NOTE — Progress Notes (Signed)
Family was at the bedside toward the end of the shift.  Family had been waiting for the patient's sister to some.  She did arrive and pt was declining rapidly.  Family requested to all be present at the bedside, turn off support, and be present.  Pt was administered 4mg  morphine for comfort.  Pt passed quickly and appeared to be in no distress with family present.  Time of death was 1844.  Dr. Joya Gaskins was notified.  [facility] had been contacted prior and the pt had been released from donation for organs, tissues and eyes - not a candidate.  Follow-up call was made to [facility] to advise them of time of death.    All lines, ventilator and CRRT was removed from patient and family was able to spend time with the patient post expiration.  Remer Macho information has been obtained.

## 2015-07-10 NOTE — Progress Notes (Signed)
Subjective: Patient remains unresponsive and intubated.  Now off sedation with no improvement.  Multiple other medical issues at this point as well including fever.    Objective: Current vital signs: BP 153/39 mmHg  Pulse 79  Temp(Src) 103.7 F (39.8 C) (Oral)  Resp 20  Ht 5\' 11"  (1.803 m)  Wt 123 kg (271 lb 2.7 oz)  BMI 37.84 kg/m2  SpO2 93% Vital signs in last 24 hours: Temp:  [98.8 F (37.1 C)-103.7 F (39.8 C)] 103.7 F (39.8 C) (10/30 0400) Pulse Rate:  [32-116] 79 (10/30 1000) Resp:  [18-33] 20 (10/30 1000) BP: (153-230)/(35-39) 153/39 mmHg (10/30 0745) SpO2:  [85 %-100 %] 93 % (10/30 1000) Arterial Line BP: (107-270)/(27-54) 130/38 mmHg (10/30 1000) FiO2 (%):  [40 %-50 %] 50 % (10/30 0940) Weight:  [123 kg (271 lb 2.7 oz)] 123 kg (271 lb 2.7 oz) (10/30 0600)  Intake/Output from previous day: 10/29 0701 - 10/30 0700 In: 4208.4 [I.V.:3573.7; NG/GT:484.8; IV Piggyback:150] Out: 1400 [Emesis/NG output:1300; Stool:100] Intake/Output this shift: Total I/O In: 468.9 [I.V.:468.9] Out: -  Nutritional status: Diet NPO time specified  Neurologic Exam: Mental Status: Keeps eyes closed. No response to deep sternal rub. Does not follow commands.  Cranial Nerves: II: No blink to threat, left pupil round 7mm unreactive to light , right pupil difficult to visualize due to significant scaring III,IV, VI: No response to oculocephalic maneuver.  V,VII: face symmetric, absent corneals bilateally VIII: unable to test IX/X: unable to test XI: bilateral shoulder shrug XII: unable to test Motor: Extremities flaccid Sensory: No response to noxious stimuli in any extremity Plantars: Mute bilaterally  Lab Results: Basic Metabolic Panel:  Recent Labs Lab 07/04/15 0504 07/04/15 1610 07/04/15 1612 07/05/15 0430 07/06/15 0410 07/07/15 0416 07/18/15 0353  NA 134* 139 137 135  135 140 140 137  K 6.7* 5.4* 5.2* 5.5*  5.4* 4.8 4.3 5.2*  CL 102 103 101 98*  99* 98* 100*  93*  CO2 19* 23  --  25  26 28 25  19*  GLUCOSE 191* 148* 147* 159*  156* 141* 166* 128*  BUN 63* 43* 40* 52*  50* 32* 30* 48*  CREATININE 10.35* 7.77*  7.42* 7.70* 9.08*  8.73* 6.67* 5.21* 7.47*  CALCIUM 9.0 9.4  --  9.8  9.7 10.0 9.6 10.5*  MG 2.1 1.9  --   --  1.8 1.7 1.9  PHOS  --  5.8*  --  6.8* 6.0* 3.0 5.9*    Liver Function Tests:  Recent Labs Lab 07/04/15 1610 07/05/15 0430 07/06/15 0410 07/07/15 0416 07-18-2015 0353  AST  --   --   --  511* 813*  ALT  --   --   --  299* 661*  ALKPHOS  --   --   --  62 96  BILITOT  --   --   --  0.6 0.9  PROT  --   --   --  4.8* 5.1*  ALBUMIN 2.7* 2.6* 2.3* 1.9* 1.8*   No results for input(s): LIPASE, AMYLASE in the last 168 hours.  Recent Labs Lab 07/06/15 1430  AMMONIA 20    CBC:  Recent Labs Lab 07/04/15 1610 07/04/15 1612 07/05/15 0430 07/06/15 0410 07/07/15 0416 07/18/15 0353  WBC 14.4*  --  13.1* 7.8 12.4* 21.6*  HGB 9.1* 9.5* 8.6* 8.1* 8.0* 8.8*  HCT 26.6* 28.0* 26.4* 24.8* 24.0* 27.6*  MCV 96.7  --  97.4 98.0 98.8 97.5  PLT 94*  --  105*  118* 166 182    Cardiac Enzymes: No results for input(s): CKTOTAL, CKMB, CKMBINDEX, TROPONINI in the last 168 hours.  Lipid Panel:  Recent Labs Lab 07/07/15 0416 07/10/2015 0353  TRIG 102 164*    CBG:  Recent Labs Lab 07/07/15 1200 07/07/15 1609 07/07/15 1959 07-10-2015 0003 10-Jul-2015 0411  GLUCAP 195* 172* 163* 148* 30    Microbiology: Results for orders placed or performed during the hospital encounter of 06/25/2015  Surgical pcr screen     Status: Abnormal   Collection Time: 07/02/15 10:12 PM  Result Value Ref Range Status   MRSA, PCR NEGATIVE NEGATIVE Final   Staphylococcus aureus POSITIVE (A) NEGATIVE Final    Comment:        The Xpert SA Assay (FDA approved for NASAL specimens in patients over 57 years of age), is one component of a comprehensive surveillance program.  Test performance has been validated by [facility] for patients  greater than or equal to 64 year old. It is not intended to diagnose infection nor to guide or monitor treatment.   Culture, blood (routine x 2)     Status: None (Preliminary result)   Collection Time: 07/06/15  1:30 PM  Result Value Ref Range Status   Specimen Description BLOOD HEMODIALYSIS LINE  Final   Special Requests BOTTLES DRAWN AEROBIC AND ANAEROBIC 5CC  Final   Culture  Setup Time   Final    GRAM NEGATIVE RODS ANAEROBIC BOTTLE ONLY CRITICAL RESULT CALLED TO, READ BACK BY AND VERIFIED WITH: N ZAFIRIS,RN AT 1158 07/07/15 BY L BENFIELD    Culture GRAM NEGATIVE RODS  Final   Report Status PENDING  Incomplete  Culture, blood (routine x 2)     Status: None (Preliminary result)   Collection Time: 07/06/15  2:30 PM  Result Value Ref Range Status   Specimen Description BLOOD HEMODIALYSIS LINE  Final   Special Requests BOTTLES DRAWN AEROBIC AND ANAEROBIC 5CC  Final   Culture  Setup Time   Final    GRAM NEGATIVE RODS IN BOTH AEROBIC AND ANAEROBIC BOTTLES CRITICAL RESULT CALLED TO, READ BACK BY AND VERIFIED WITH: N ZAFIRIS,RN AT 1317 07/07/15 BY L BENFIELD    Culture PSEUDOMONAS AERUGINOSA  Final   Report Status PENDING  Incomplete  Culture, Urine     Status: None (Preliminary result)   Collection Time: 07/06/15  3:09 PM  Result Value Ref Range Status   Specimen Description URINE, CATHETERIZED  Final   Special Requests none Immunocompromised  Final   Culture   Final    >=100,000 COLONIES/mL PSEUDOMONAS AERUGINOSA SUSCEPTIBILITIES TO FOLLOW    Report Status PENDING  Incomplete  Culture, respiratory (NON-Expectorated)     Status: None (Preliminary result)   Collection Time: 07/06/15  5:43 PM  Result Value Ref Range Status   Specimen Description TRACHEAL ASPIRATE  Final   Special Requests Normal  Final   Gram Stain   Final    MODERATE WBC PRESENT, PREDOMINANTLY PMN FEW SQUAMOUS EPITHELIAL CELLS PRESENT NO ORGANISMS SEEN Performed at [facility]    Culture   Final     Non-Pathogenic Oropharyngeal-type Flora Isolated. Performed at [facility]    Report Status PENDING  Incomplete  Stat Gram stain     Status: None   Collection Time: 07/07/15  9:37 AM  Result Value Ref Range Status   Specimen Description STERNUM  Final   Special Requests Normal  Final   Gram Stain   Final    RARE WBC  PRESENT, PREDOMINANTLY PMN NO ORGANISMS SEEN    Report Status 07/07/2015 FINAL  Final  C difficile quick scan w PCR reflex     Status: None   Collection Time: 07/07/15  1:53 PM  Result Value Ref Range Status   C Diff antigen NEGATIVE NEGATIVE Final   C Diff toxin NEGATIVE NEGATIVE Final   C Diff interpretation Negative for toxigenic C. difficile  Final  Culture, blood (routine x 2)     Status: None (Preliminary result)   Collection Time: 07/07/15  7:20 PM  Result Value Ref Range Status   Specimen Description BLOOD LEFT HAND  Final   Special Requests IN PEDIATRIC BOTTLE 1CC  Final   Culture NO GROWTH < 24 HOURS  Final   Report Status PENDING  Incomplete    Coagulation Studies: No results for input(s): LABPROT, INR in the last 72 hours.  Imaging: Dg Abd 1 View  07/06/2015  CLINICAL DATA:  Evaluate NG tube placement EXAM: ABDOMEN - 1 VIEW COMPARISON:  None. FINDINGS: The feeding tube tip appears to be in the projection of the proximal duodenum. Dilated loops of bowel noted within the upper abdomen. IMPRESSION: Tip of feeding tube is in the projection of the expected location of the proximal duodenum. Electronically Signed   By: Kerby Moors M.D.   On: 07/06/2015 15:15   Ir Fluoro Guide Cv Line Left  07/06/2015  CLINICAL DATA:  Status post CABG. A right jugular sheath niece to be removed due to fever and additional central venous access has been requested. EXAM: NON-TUNNELED CENTRAL VENOUS CATHETER PLACEMENT WITH ULTRASOUND AND FLUOROSCOPIC GUIDANCE FLUOROSCOPY TIME:  24 seconds. PROCEDURE: The procedure, risks, benefits, and alternatives were explained  to the patient's wife. Questions regarding the procedure were encouraged and answered. The patient's wife understands and consents to the procedure. A time-out was performed prior to the procedure. The left neck and chest were prepped with chlorhexidine in a sterile fashion, and a sterile drape was applied covering the operative field. Maximum barrier sterile technique with sterile gowns and gloves were used for the procedure. Local anesthesia was provided with 1% lidocaine. Ultrasound was used to confirm patency of the left internal jugular vein. After creating a small venotomy incision, a 21 gauge needle was advanced into the left internal jugular vein under direct, real-time ultrasound guidance. Ultrasound image documentation was performed. After securing guidewire access, a peel-away sheath was placed over a guide wire. Utilizing guide wire measurement, a 6 [place], triple-lumen Power Line non tunneled catheter was cut to 26 cm. The catheter was then placed through the sheath and the sheath removed. Final catheter positioning was confirmed and documented with a fluoroscopic spot image. The catheter was aspirated and flushed with saline. The catheter exit site was secured with 0-Prolene retention sutures. COMPLICATIONS: None.  No pneumothorax. FINDINGS: After catheter placement, the tip lies at the cavoatrial junction. The catheter aspirates normally and is ready for immediate use. IMPRESSION: Placement of non-tunneled central venous catheter via the left internal jugular vein. The catheter tip lies at the cavoatrial junction. The catheter is ready for immediate use. Electronically Signed   By: Aletta Edouard M.D.   On: 07/06/2015 13:25   Ir US Guide Vasc Access Left  07/06/2015  CLINICAL DATA:  Status post CABG. A right jugular sheath niece to be removed due to fever and additional central venous access has been requested. EXAM: NON-TUNNELED CENTRAL VENOUS CATHETER PLACEMENT WITH ULTRASOUND AND FLUOROSCOPIC  GUIDANCE FLUOROSCOPY TIME:  24 seconds. PROCEDURE:  The procedure, risks, benefits, and alternatives were explained to the patient's wife. Questions regarding the procedure were encouraged and answered. The patient's wife understands and consents to the procedure. A time-out was performed prior to the procedure. The left neck and chest were prepped with chlorhexidine in a sterile fashion, and a sterile drape was applied covering the operative field. Maximum barrier sterile technique with sterile gowns and gloves were used for the procedure. Local anesthesia was provided with 1% lidocaine. Ultrasound was used to confirm patency of the left internal jugular vein. After creating a small venotomy incision, a 21 gauge needle was advanced into the left internal jugular vein under direct, real-time ultrasound guidance. Ultrasound image documentation was performed. After securing guidewire access, a peel-away sheath was placed over a guide wire. Utilizing guide wire measurement, a 6 [place], triple-lumen Power Line non tunneled catheter was cut to 26 cm. The catheter was then placed through the sheath and the sheath removed. Final catheter positioning was confirmed and documented with a fluoroscopic spot image. The catheter was aspirated and flushed with saline. The catheter exit site was secured with 0-Prolene retention sutures. COMPLICATIONS: None.  No pneumothorax. FINDINGS: After catheter placement, the tip lies at the cavoatrial junction. The catheter aspirates normally and is ready for immediate use. IMPRESSION: Placement of non-tunneled central venous catheter via the left internal jugular vein. The catheter tip lies at the cavoatrial junction. The catheter is ready for immediate use. Electronically Signed   By: Aletta Edouard M.D.   On: 07/06/2015 13:25   Dg Chest Port 1 View  07/31/2015  CLINICAL DATA:  Atelectasis EXAM: PORTABLE CHEST 1 VIEW COMPARISON:  07/07/2015 FINDINGS: ET tube tip is above the carina. The  feeding tube tip is of colo be GE junction. Normal heart size. No pleural effusion or edema. The lung volumes are low. IMPRESSION: 1. Stable support apparatus. 2. Cardiac enlargement and low lung volumes. Electronically Signed   By: Kerby Moors M.D.   On: 07-31-2015 08:23   Dg Chest Port 1 View  07/07/2015  CLINICAL DATA:  Acute hypoxic respiratory failure. CABG on 06/17/2015. EXAM: PORTABLE CHEST 1 VIEW COMPARISON:  07/06/2015 and 07/05/2015 FINDINGS: Endotracheal tube, central venous catheter and feeding tube appear in good position. New slight atelectasis/ effusion at the right base. Increased atelectasis and probable small effusion at the left base. Heart size and pulmonary vascularity are unchanged and normal. IMPRESSION: Atelectasis and small effusions, new on the right and increased on the left. Electronically Signed   By: Lorriane Shire M.D.   On: 07/07/2015 08:14   Dg Chest Port 1 View  07/06/2015  CLINICAL DATA:  Intubation, respiratory arrest following dialysis EXAM: PORTABLE CHEST 1 VIEW COMPARISON:  07/06/2015 FINDINGS: Endotracheal tube terminates 6 mm above the carina. Mild elevation of the left hemidiaphragm. Blunting of the left costophrenic angle, raise the possibility small left pleural effusion. Cardiomegaly with pulmonary vascular congestion. Possible mild interstitial edema. No pneumothorax. Median sternotomy. Left subclavian pacemaker. Left IJ venous catheter terminates in the lower SVC. IMPRESSION: Endotracheal tube terminates 6 cm above the carina. Cardiomegaly with mild interstitial edema and possible small left pleural effusion. Left IJ venous catheter terminates in the lower SVC. Electronically Signed   By: Julian Hy M.D.   On: 07/06/2015 17:42   US Abdomen Limited Ruq  07/07/2015  CLINICAL DATA:  Transaminitis, status post CABG on 06/11/2015 EXAM: US ABDOMEN LIMITED - RIGHT UPPER QUADRANT COMPARISON:  CT abdomen pelvis dated 01/17/2013 FINDINGS: Gallbladder:  Gallbladder sludge.  No gallstones, gallbladder wall thickening, or pericholecystic fluid. Sonographic Murphy's sign could not be assessed. Common bile duct: Diameter: 6 mm Liver: Hyperechoic hepatic parenchyma, suggesting hepatic steatosis. No focal hepatic lesion is seen. Additional comments:  Right pleural effusion. IMPRESSION: Gallbladder sludge, without associated sonographic findings to suggest acute cholecystitis. Suspected hepatic steatosis. Right pleural effusion. Electronically Signed   By: Julian Hy M.D.   On: 07/07/2015 17:08    Medications:  I have reviewed the patient's current medications. Scheduled: . antiseptic oral rinse  7 mL Mouth Rinse q12n4p  . aspirin EC  325 mg Oral Daily   Or  . aspirin  324 mg Per Tube Daily  . chlorhexidine  15 mL Mouth Rinse BID  . [START ON 07/09/2015] darbepoetin (ARANESP) injection - NON-DIALYSIS  200 mcg Subcutaneous Q Mon-1800  . dextrose      . feeding supplement (PRO-STAT SUGAR FREE 64)  60 mL Per Tube QID  . heparin subcutaneous  5,000 Units Subcutaneous 3 times per day  . insulin aspart  0-15 Units Subcutaneous 6 times per day  . levofloxacin (LEVAQUIN) IV  500 mg Intravenous Q48H  . magnesium sulfate  4 g Intravenous Once  . meropenem (MERREM) IV  500 mg Intravenous Q24H  . metoprolol tartrate  12.5 mg Oral BID   Or  . metoprolol tartrate  12.5 mg Per Tube BID  . pantoprazole sodium  40 mg Per Tube Daily  . sodium chloride  3 mL Intravenous Q12H  . sucroferric oxyhydroxide  1,000 mg Oral TID WC  . vancomycin  1,000 mg Intravenous Q M,W,F-HD    Assessment/Plan: Patient remains unresponsive.  Neurological examination unchanged.  Multiple other medical issues present now as well including fever, increased white blood cell count, worsening liver function and worsening renal function.  These are all in some part potentially likely affecting mental status as well.  EEG slow with triphasic waves which is consistent with patient  clinically.  No evidence of seizure.    Recommendations: 1.  With patient's other medical issues will not repeat head CT at this time since it will not likely change management.  Will continue to follow with you.       LOS: 6 days   Alexis Goodell, MD [facility] [phone] 29-Jul-2015  10:27 AM

## 2015-07-10 NOTE — Progress Notes (Signed)
Pt transported to CT on 100% Fi02 via vent, Pt remained stable during transport.

## 2015-07-10 NOTE — Procedures (Signed)
ELECTROENCEPHALOGRAM REPORT   Patient: Antonio Page       Room #: 3T51 EEG No. ID: [id number] Age: 74 y.o.        Sex: male Referring Physician: Servando Snare Report Date:  Aug 01, 2015        Interpreting Physician: Alexis Goodell  History: Antonio Page is an 74 y.o. male with altered mental status s/p CABG  Medications:  Scheduled: . antiseptic oral rinse  7 mL Mouth Rinse q12n4p  . aspirin EC  325 mg Oral Daily   Or  . aspirin  324 mg Per Tube Daily  . chlorhexidine  15 mL Mouth Rinse BID  . darbepoetin (ARANESP) injection - DIALYSIS  60 mcg Intravenous Q Mon-HD  . dextrose      . feeding supplement (PRO-STAT SUGAR FREE 64)  60 mL Per Tube QID  . heparin subcutaneous  5,000 Units Subcutaneous 3 times per day  . insulin aspart  0-15 Units Subcutaneous 6 times per day  . levofloxacin (LEVAQUIN) IV  500 mg Intravenous Q48H  . magnesium sulfate  4 g Intravenous Once  . meropenem (MERREM) IV  500 mg Intravenous Q24H  . metoprolol tartrate  12.5 mg Oral BID   Or  . metoprolol tartrate  12.5 mg Per Tube BID  . pantoprazole sodium  40 mg Per Tube Daily  . sodium chloride  3 mL Intravenous Q12H  . sucroferric oxyhydroxide  1,000 mg Oral TID WC  . vancomycin  1,000 mg Intravenous Q M,W,F-HD    Conditions of Recording:  This is a 16 channel EEG carried out with the patient in the intubated and sedated state.  Description:  The background activity consists of a low to moderate voltage delta activity with a maximum frequency of 5Hz .  This activity is poorly organized.  It is continuous for the most part although there are occasions of periods of attenuation that are generalized but of short duration lasting up to 1s.  There are also some intermittent periodic discharges of triphasic morphology noted.   No normal sleep activity is noted.   Hyperventilation and intermittent photic stimulation were not performed.   IMPRESSION: This is an abnormal electroencephalogram secondary to  general background slowing and intermittent triphasic waves.  This finding is most consistent with an encephalopathy, etiology nonspecific, although most commonly seen with hepatic etiology.   Alexis Goodell, MD [facility] [phone] 08/01/2015, 10:27 AM

## 2015-07-10 NOTE — Progress Notes (Signed)
INFECTIOUS DISEASE PROGRESS NOTE  ID: Antonio Page is a 74 y.o. male with  Principal Problem:   Unstable angina ([place]) Active Problems:   Morbid obesity ([place])   OSA (obstructive sleep apnea)   Diabetes mellitus type 2, insulin dependent ([place])   Uncontrolled hypertension   Coronary artery disease   End stage renal disease on dialysis ([place])   Anemia in chronic kidney disease   Complete heart block, temp pacemaker, s/p PPM MDT dual chamber 5/4/1t6   CAD (coronary artery disease)   Chest pain   Acute respiratory failure with hypoxia ([place])   HCAP (healthcare-associated pneumonia)   Transaminitis   Septic shock ([facility])   S/P CABG x 2  Subjective: Continued fever (103.7)  On vent, high dose norepi, vasopressin  Abtx:  Anti-infectives    Start     Dose/Rate Route Frequency Ordered Stop   07/09/15 1200  cefTAZidime (FORTAZ) 2 g in dextrose 5 % 50 mL IVPB  Status:  Discontinued     2 g 100 mL/hr over 30 Minutes Intravenous Every M-W-F (Hemodialysis) 07/07/15 1152 07/07/15 1843   07/07/15 2000  levofloxacin (LEVAQUIN) IVPB 500 mg     500 mg 100 mL/hr over 60 Minutes Intravenous Every 48 hours 07/07/15 1851     07/07/15 1900  meropenem (MERREM) 500 mg in sodium chloride 0.9 % 50 mL IVPB     500 mg 100 mL/hr over 30 Minutes Intravenous Every 24 hours 07/07/15 1851     07/06/15 1815  cefTAZidime (FORTAZ) 2 g in dextrose 5 % 50 mL IVPB     2 g 100 mL/hr over 30 Minutes Intravenous  Once 07/06/15 1814 07/06/15 2024   07/06/15 1200  vancomycin (VANCOCIN) IVPB 1000 mg/200 mL premix     1,000 mg 200 mL/hr over 60 Minutes Intravenous Every M-W-F (Hemodialysis) 07/06/15 1039     07/06/15 1030  vancomycin (VANCOCIN) 2,000 mg in sodium chloride 0.9 % 500 mL IVPB     2,000 mg 250 mL/hr over 120 Minutes Intravenous  Once 07/06/15 1021 07/06/15 1525   07/04/15 1800  cefUROXime (ZINACEF) 750 g in dextrose 5 % 50 mL IVPB     750 g 100 mL/hr over 30 Minutes Intravenous  Once 06/27/2015 1330  07/04/15 1754   06/09/2015 1945  vancomycin (VANCOCIN) IVPB 1000 mg/200 mL premix  Status:  Discontinued     1,000 mg 200 mL/hr over 60 Minutes Intravenous  Once 06/18/2015 1330 06/23/2015 1358   06/13/2015 0400  vancomycin (VANCOCIN) 1,500 mg in sodium chloride 0.9 % 250 mL IVPB     1,500 mg 125 mL/hr over 120 Minutes Intravenous To Surgery 07/02/15 1719 06/28/2015 0819   07/01/2015 0400  cefUROXime (ZINACEF) 1.5 g in dextrose 5 % 50 mL IVPB     1.5 g 100 mL/hr over 30 Minutes Intravenous To Surgery 07/02/15 1719 06/18/2015 1205   07/04/2015 0400  cefUROXime (ZINACEF) 750 mg in dextrose 5 % 50 mL IVPB  Status:  Discontinued     750 mg 100 mL/hr over 30 Minutes Intravenous To Surgery 07/02/15 1717 06/10/2015 1330      Medications:  Scheduled: . antiseptic oral rinse  7 mL Mouth Rinse q12n4p  . aspirin EC  325 mg Oral Daily   Or  . aspirin  324 mg Per Tube Daily  . chlorhexidine  15 mL Mouth Rinse BID  . [START ON 07/09/2015] darbepoetin (ARANESP) injection - NON-DIALYSIS  200 mcg Subcutaneous Q Mon-1800  . dextrose      .  feeding supplement (PRO-STAT SUGAR FREE 64)  60 mL Per Tube QID  . heparin subcutaneous  5,000 Units Subcutaneous 3 times per day  . insulin aspart  0-15 Units Subcutaneous 6 times per day  . levofloxacin (LEVAQUIN) IV  500 mg Intravenous Q48H  . magnesium sulfate  4 g Intravenous Once  . meropenem (MERREM) IV  500 mg Intravenous Q24H  . metoprolol tartrate  12.5 mg Oral BID   Or  . metoprolol tartrate  12.5 mg Per Tube BID  . pantoprazole sodium  40 mg Per Tube Daily  . sodium chloride  3 mL Intravenous Q12H  . sucroferric oxyhydroxide  1,000 mg Oral TID WC  . vancomycin  1,000 mg Intravenous Q M,W,F-HD    Objective: Vital signs in last 24 hours: Temp:  [98.8 F (37.1 C)-103.7 F (39.8 C)] 103.7 F (39.8 C) (10/30 0400) Pulse Rate:  [32-116] 79 (10/30 1000) Resp:  [18-33] 20 (10/30 1000) BP: (153-230)/(35-39) 153/39 mmHg (10/30 0745) SpO2:  [85 %-100 %] 93 % (10/30  1000) Arterial Line BP: (107-270)/(27-54) 130/38 mmHg (10/30 1000) FiO2 (%):  [40 %-50 %] 50 % (10/30 0940) Weight:  [123 kg (271 lb 2.7 oz)] 123 kg (271 lb 2.7 oz) (10/30 0600)   General appearance: no distress Resp: diminished breath sounds bilaterally Chest wall: bloody purulence from upper sternal wound Cardio: regular rate and rhythm GI: abnormal findings:  distended and hypoactive bowel sounds Extremities: cool  Lab Results  Recent Labs  07/07/15 0416 2015-07-25 0353  WBC 12.4* 21.6*  HGB 8.0* 8.8*  HCT 24.0* 27.6*  NA 140 137  K 4.3 5.2*  CL 100* 93*  CO2 25 19*  BUN 30* 48*  CREATININE 5.21* 7.47*   Liver Panel  Recent Labs  07/07/15 0416 July 25, 2015 0353  PROT 4.8* 5.1*  ALBUMIN 1.9* 1.8*  AST 511* 813*  ALT 299* 661*  ALKPHOS 62 96  BILITOT 0.6 0.9   Sedimentation Rate No results for input(s): ESRSEDRATE in the last 72 hours. C-Reactive Protein No results for input(s): CRP in the last 72 hours.  Microbiology: Recent Results (from the past 240 hour(s))  Surgical pcr screen     Status: Abnormal   Collection Time: 07/02/15 10:12 PM  Result Value Ref Range Status   MRSA, PCR NEGATIVE NEGATIVE Final   Staphylococcus aureus POSITIVE (A) NEGATIVE Final    Comment:        The Xpert SA Assay (FDA approved for NASAL specimens in patients over 19 years of age), is one component of a comprehensive surveillance program.  Test performance has been validated by [facility] for patients greater than or equal to 82 year old. It is not intended to diagnose infection nor to guide or monitor treatment.   Culture, blood (routine x 2)     Status: None (Preliminary result)   Collection Time: 07/06/15  1:30 PM  Result Value Ref Range Status   Specimen Description BLOOD HEMODIALYSIS LINE  Final   Special Requests BOTTLES DRAWN AEROBIC AND ANAEROBIC 5CC  Final   Culture  Setup Time   Final    GRAM NEGATIVE RODS ANAEROBIC BOTTLE ONLY CRITICAL RESULT CALLED TO,  READ BACK BY AND VERIFIED WITH: N ZAFIRIS,RN AT [id number] 07/07/15 BY L BENFIELD    Culture GRAM NEGATIVE RODS  Final   Report Status PENDING  Incomplete  Culture, blood (routine x 2)     Status: None (Preliminary result)   Collection Time: 07/06/15  2:30 PM  Result Value Ref  Range Status   Specimen Description BLOOD HEMODIALYSIS LINE  Final   Special Requests BOTTLES DRAWN AEROBIC AND ANAEROBIC 5CC  Final   Culture  Setup Time   Final    GRAM NEGATIVE RODS IN BOTH AEROBIC AND ANAEROBIC BOTTLES CRITICAL RESULT CALLED TO, READ BACK BY AND VERIFIED WITH: N ZAFIRIS,RN AT [id number] 07/07/15 BY L BENFIELD    Culture PSEUDOMONAS AERUGINOSA  Final   Report Status PENDING  Incomplete  Culture, Urine     Status: None (Preliminary result)   Collection Time: 07/06/15  3:09 PM  Result Value Ref Range Status   Specimen Description URINE, CATHETERIZED  Final   Special Requests none Immunocompromised  Final   Culture   Final    >=100,000 COLONIES/mL PSEUDOMONAS AERUGINOSA SUSCEPTIBILITIES TO FOLLOW    Report Status PENDING  Incomplete  Culture, respiratory (NON-Expectorated)     Status: None (Preliminary result)   Collection Time: 07/06/15  5:43 PM  Result Value Ref Range Status   Specimen Description TRACHEAL ASPIRATE  Final   Special Requests Normal  Final   Gram Stain   Final    MODERATE WBC PRESENT, PREDOMINANTLY PMN FEW SQUAMOUS EPITHELIAL CELLS PRESENT NO ORGANISMS SEEN Performed at [facility]    Culture   Final    Non-Pathogenic Oropharyngeal-type Flora Isolated. Performed at [facility]    Report Status PENDING  Incomplete  Stat Gram stain     Status: None   Collection Time: 07/07/15  9:37 AM  Result Value Ref Range Status   Specimen Description STERNUM  Final   Special Requests Normal  Final   Gram Stain   Final    RARE WBC PRESENT, PREDOMINANTLY PMN NO ORGANISMS SEEN    Report Status 07/07/2015 FINAL  Final  C difficile quick scan w PCR reflex     Status: None     Collection Time: 07/07/15  1:53 PM  Result Value Ref Range Status   C Diff antigen NEGATIVE NEGATIVE Final   C Diff toxin NEGATIVE NEGATIVE Final   C Diff interpretation Negative for toxigenic C. difficile  Final  Culture, blood (routine x 2)     Status: None (Preliminary result)   Collection Time: 07/07/15  7:20 PM  Result Value Ref Range Status   Specimen Description BLOOD LEFT HAND  Final   Special Requests IN PEDIATRIC BOTTLE 1CC  Final   Culture NO GROWTH < 24 HOURS  Final   Report Status PENDING  Incomplete    Studies/Results: Dg Abd 1 View  07/06/2015  CLINICAL DATA:  Evaluate NG tube placement EXAM: ABDOMEN - 1 VIEW COMPARISON:  None. FINDINGS: The feeding tube tip appears to be in the projection of the proximal duodenum. Dilated loops of bowel noted within the upper abdomen. IMPRESSION: Tip of feeding tube is in the projection of the expected location of the proximal duodenum. Electronically Signed   By: Kerby Moors M.D.   On: 07/06/2015 15:15   Ir Fluoro Guide Cv Line Left  07/06/2015  CLINICAL DATA:  Status post CABG. A right jugular sheath niece to be removed due to fever and additional central venous access has been requested. EXAM: NON-TUNNELED CENTRAL VENOUS CATHETER PLACEMENT WITH ULTRASOUND AND FLUOROSCOPIC GUIDANCE FLUOROSCOPY TIME:  24 seconds. PROCEDURE: The procedure, risks, benefits, and alternatives were explained to the patient's wife. Questions regarding the procedure were encouraged and answered. The patient's wife understands and consents to the procedure. A time-out was performed prior to the procedure. The left neck and  chest were prepped with chlorhexidine in a sterile fashion, and a sterile drape was applied covering the operative field. Maximum barrier sterile technique with sterile gowns and gloves were used for the procedure. Local anesthesia was provided with 1% lidocaine. Ultrasound was used to confirm patency of the left internal jugular vein. After  creating a small venotomy incision, a 21 gauge needle was advanced into the left internal jugular vein under direct, real-time ultrasound guidance. Ultrasound image documentation was performed. After securing guidewire access, a peel-away sheath was placed over a guide wire. Utilizing guide wire measurement, a 6 [place], triple-lumen Power Line non tunneled catheter was cut to 26 cm. The catheter was then placed through the sheath and the sheath removed. Final catheter positioning was confirmed and documented with a fluoroscopic spot image. The catheter was aspirated and flushed with saline. The catheter exit site was secured with 0-Prolene retention sutures. COMPLICATIONS: None.  No pneumothorax. FINDINGS: After catheter placement, the tip lies at the cavoatrial junction. The catheter aspirates normally and is ready for immediate use. IMPRESSION: Placement of non-tunneled central venous catheter via the left internal jugular vein. The catheter tip lies at the cavoatrial junction. The catheter is ready for immediate use. Electronically Signed   By: Aletta Edouard M.D.   On: 07/06/2015 13:25   Ir US Guide Vasc Access Left  07/06/2015  CLINICAL DATA:  Status post CABG. A right jugular sheath niece to be removed due to fever and additional central venous access has been requested. EXAM: NON-TUNNELED CENTRAL VENOUS CATHETER PLACEMENT WITH ULTRASOUND AND FLUOROSCOPIC GUIDANCE FLUOROSCOPY TIME:  24 seconds. PROCEDURE: The procedure, risks, benefits, and alternatives were explained to the patient's wife. Questions regarding the procedure were encouraged and answered. The patient's wife understands and consents to the procedure. A time-out was performed prior to the procedure. The left neck and chest were prepped with chlorhexidine in a sterile fashion, and a sterile drape was applied covering the operative field. Maximum barrier sterile technique with sterile gowns and gloves were used for the procedure. Local  anesthesia was provided with 1% lidocaine. Ultrasound was used to confirm patency of the left internal jugular vein. After creating a small venotomy incision, a 21 gauge needle was advanced into the left internal jugular vein under direct, real-time ultrasound guidance. Ultrasound image documentation was performed. After securing guidewire access, a peel-away sheath was placed over a guide wire. Utilizing guide wire measurement, a 6 [place], triple-lumen Power Line non tunneled catheter was cut to 26 cm. The catheter was then placed through the sheath and the sheath removed. Final catheter positioning was confirmed and documented with a fluoroscopic spot image. The catheter was aspirated and flushed with saline. The catheter exit site was secured with 0-Prolene retention sutures. COMPLICATIONS: None.  No pneumothorax. FINDINGS: After catheter placement, the tip lies at the cavoatrial junction. The catheter aspirates normally and is ready for immediate use. IMPRESSION: Placement of non-tunneled central venous catheter via the left internal jugular vein. The catheter tip lies at the cavoatrial junction. The catheter is ready for immediate use. Electronically Signed   By: Aletta Edouard M.D.   On: 07/06/2015 13:25   Dg Chest Port 1 View  07-28-15  CLINICAL DATA:  Atelectasis EXAM: PORTABLE CHEST 1 VIEW COMPARISON:  07/07/2015 FINDINGS: ET tube tip is above the carina. The feeding tube tip is of colo be GE junction. Normal heart size. No pleural effusion or edema. The lung volumes are low. IMPRESSION: 1. Stable support apparatus. 2. Cardiac enlargement and  low lung volumes. Electronically Signed   By: Kerby Moors M.D.   On: 08-04-2015 08:23   Dg Chest Port 1 View  07/07/2015  CLINICAL DATA:  Acute hypoxic respiratory failure. CABG on 06/26/2015. EXAM: PORTABLE CHEST 1 VIEW COMPARISON:  07/06/2015 and 07/05/2015 FINDINGS: Endotracheal tube, central venous catheter and feeding tube appear in good position.  New slight atelectasis/ effusion at the right base. Increased atelectasis and probable small effusion at the left base. Heart size and pulmonary vascularity are unchanged and normal. IMPRESSION: Atelectasis and small effusions, new on the right and increased on the left. Electronically Signed   By: Lorriane Shire M.D.   On: 07/07/2015 08:14   Dg Chest Port 1 View  07/06/2015  CLINICAL DATA:  Intubation, respiratory arrest following dialysis EXAM: PORTABLE CHEST 1 VIEW COMPARISON:  07/06/2015 FINDINGS: Endotracheal tube terminates 6 mm above the carina. Mild elevation of the left hemidiaphragm. Blunting of the left costophrenic angle, raise the possibility small left pleural effusion. Cardiomegaly with pulmonary vascular congestion. Possible mild interstitial edema. No pneumothorax. Median sternotomy. Left subclavian pacemaker. Left IJ venous catheter terminates in the lower SVC. IMPRESSION: Endotracheal tube terminates 6 cm above the carina. Cardiomegaly with mild interstitial edema and possible small left pleural effusion. Left IJ venous catheter terminates in the lower SVC. Electronically Signed   By: Julian Hy M.D.   On: 07/06/2015 17:42   US Abdomen Limited Ruq  07/07/2015  CLINICAL DATA:  Transaminitis, status post CABG on 06/14/2015 EXAM: US ABDOMEN LIMITED - RIGHT UPPER QUADRANT COMPARISON:  CT abdomen pelvis dated 01/17/2013 FINDINGS: Gallbladder: Gallbladder sludge. No gallstones, gallbladder wall thickening, or pericholecystic fluid. Sonographic Murphy's sign could not be assessed. Common bile duct: Diameter: 6 mm Liver: Hyperechoic hepatic parenchyma, suggesting hepatic steatosis. No focal hepatic lesion is seen. Additional comments:  Right pleural effusion. IMPRESSION: Gallbladder sludge, without associated sonographic findings to suggest acute cholecystitis. Suspected hepatic steatosis. Right pleural effusion. Electronically Signed   By: Julian Hy M.D.   On: 07/07/2015 17:08      Assessment/Plan: Pseudomonas Sepsis (3/4 bottles) Continue merrem (lower seizure risk vs imipenem) and levaquin Await repeat BCx Await his sternal Cx, have repeated from upper wound Await his BAL- Upper respiratory flora CT of chest today Await ID, sensi of GNR Consider stopping vanco as he improves  Pyelonephritis, possible Urosepsis pseudomonas  Ileus for CT today  CAD, s/p CABG  possible wound infection vs hematoma  Previous Pacer  ESRD RUE fistula Is getting CRRT line today  Loose BM C diff toxin (-)  Elevated LFTs Suggest passive congestion due to sepsis- worsening today RUQ U/s- sludge, hepatic steatosis   Total days of antibiotics: 1 merrem/levaqun, 5 vanco                   Bobby Rumpf Infectious Diseases (pager) [phone] [email or web address] 08/04/15, 10:31 AM  LOS: 6 days

## 2015-07-10 NOTE — Procedures (Signed)
Hemodialysis Insertion Procedure Note DANY WALTHER [id number] 1940-10-26  Procedure: Insertion of Hemodialysis Catheter Type: 3 port  Indications: Hemodialysis   Procedure Details Consent: Risks of procedure as well as the alternatives and risks of each were explained to the (patient/caregiver).  Consent for procedure obtained. Time Out: Verified patient identification, verified procedure, site/side was marked, verified correct patient position, special equipment/implants available, medications/allergies/relevent history reviewed, required imaging and test results available.  Performed  Maximum sterile technique was used including antiseptics, cap, gloves, gown, hand hygiene, mask and sheet. Skin prep: Chlorhexidine; local anesthetic administered A antimicrobial bonded/coated triple lumen catheter was placed in the right internal jugular vein using the Seldinger technique. Ultrasound guidance used.Yes.   Catheter placed to 16 cm. Blood aspirated via all 3 ports and then flushed x 3. Line sutured x 2 and dressing applied.  Evaluation Blood flow good Complications: No apparent complications Patient did tolerate procedure well. Chest X-ray ordered to verify placement.  CXR: pending.  Richardson Landry Minor ACNP Maryanna Shape PCCM Pager [phone] till 3 pm If no answer page [phone] 07-22-15, 11:48 AM

## 2015-07-10 NOTE — Progress Notes (Signed)
[facility] Progress Note Patient Name: Antonio Page DOB: [birth year] MRN: [id number]   Date of Service  July 16, 2015  HPI/Events of Note  Severe acidosis metabolic  eICU Interventions  Gave two amps Na bicarb IVP and increase bicarb drip to 150cc/hr     Intervention Category Major Interventions: Acid-Base disturbance - evaluation and management  Asencion Noble 16-Jul-2015, 3:27 PM

## 2015-07-10 NOTE — Progress Notes (Signed)
eLink Physician-Brief Progress Note Patient Name: Antonio Page DOB: 1941/02/12 MRN: [id number]   Date of Service  Aug 02, 2015  HPI/Events of Note  Persistent shock state. Met acidosis  eICU Interventions  Increase vent to 28 rate, adjust Itime and Vt Reduce peep 8 , add neo drip      Intervention Category Major Interventions: Respiratory failure - evaluation and management;Acid-Base disturbance - evaluation and management;Shock - evaluation and management  Asencion Noble 2015/08/02, 4:08 PM

## 2015-07-10 NOTE — Progress Notes (Signed)
After reviewing the history and recent findings, I had a long discussion with the family about the likely scenario if the patient were to have surgery.  Although not completely out of the question, complete functional recovery to where he would be independent and without need for a skilled nursing facility is unlikely.  Lactic acid level is > 16.    The family has decided not to proceed with surgery and to provide comfort care.  I have informed the critical care team and Dr. Ricard Dillon about the family's decision.  I believe that the decision is well-thought out and is in the best interest of the patient.  Kathryne Eriksson. Dahlia Bailiff, MD, [place] [phone] [facility] Surgery

## 2015-07-10 NOTE — Progress Notes (Signed)
[facility] BRIEF SICU PROGRESS NOTE  5 Days Post-Op  S/P Procedure(s) (LRB): CORONARY ARTERY BYPASS GRAFTING (CABG) times two using left internal mammary artery and right saphenous leg vein.  [place] to LAD and SVG to distal circumflex. (N/A) TRANSESOPHAGEAL ECHOCARDIOGRAM (TEE) (N/A)   CT scan of chest/abd/pelvis reviewed and discussed w/ Dr. [place] Pink, the interpreting radiologist.  Mr Bass has radiographic findings c/w high grade distal small bowel obstruction and pneumatosis in the bowel wall suggestive of possible ischemic bowel.  There is no sign of intraperitoneal air to suggest perforation.  CT scan of the head also reveals a small acute embolic non-hemorrhagic stroke in the right occipital pole.    CT CHEST, ABDOMEN, AND PELVIS WITH CONTRAST  TECHNIQUE: Multidetector CT imaging of the chest, abdomen and pelvis was performed following the standard protocol during bolus administration of intravenous contrast.  CONTRAST: 141mL OMNIPAQUE IOHEXOL 300 MG/ML SOLN  COMPARISON: CTA chest dated 01/08/2015. CT abdomen pelvis dated 01/17/2013.  FINDINGS: CT CHEST FINDINGS  Mediastinum/Nodes: Cardiomegaly. No pericardial effusion.  Coronary atherosclerosis. Postsurgical changes related to prior CABG.  Atherosclerotic calcifications of the aortic arch.  Small mediastinal lymph nodes which do not meet pathologic CT size criteria.  Visualized right thyroid is mildly nodular/heterogeneous.  Lungs/Pleura: Endotracheal tube terminates 5 cm above the carina.  Mild patchy bilateral lower lobe opacities (series 5/ image 31), favored to reflect dependent/compressive atelectasis, pneumonia not entirely excluded. Additional mild dependent atelectasis in the posterior bilateral upper lobes (series 6/image 16). Mild patchy opacity in the lingula (series 6/image 16), also likely reflecting atelectasis, less likely infection.  Associated small bilateral pleural effusions.  No  suspicious pulmonary nodules. No frank interstitial edema. No pneumothorax.  Musculoskeletal: Degenerative changes of the thoracic spine. Median sternotomy.  CT ABDOMEN PELVIS FINDINGS  Hepatobiliary: 2.8 cm hypoenhancing lesion in the lateral segment left hepatic lobe (series 5/ image 54) with probable peripheral nodular discontinuous enhancement, favored to reflect a benign hemangioma, grossly unchanged since 2014.  Distended gallbladder with layering gallbladder sludge and/or noncalcified gallstones (series 5/image 66). No associated inflammatory changes.  No intrahepatic or extrahepatic ductal dilatation.  Pancreas: Within normal limits.  Spleen: Within normal limits.  Adrenals/Urinary Tract: Adrenal glands are within normal limits.  Bilateral renal cortical atrophy. Vascular calcifications. No renal calculi or hydronephrosis.  Bladder is mildly thick-walled but underdistended.  Stomach/Bowel: Stomach is notable for a weighted feeding tube and an enteric tube which terminates in the distal gastric antrum.  Multiple dilated loops of small bowel in the left mid abdomen, some of which demonstrate pneumatosis (for example, series 5/ images 81 and 93). Associated transition in the left mid abdomen (series 5/ image 95) to decompressed loops of small bowel in the right abdomen.  Colon is relatively decompressed. Rectal tube.  This appearance is worrisome for high-grade partial or complete small bowel obstruction with at risk/necrotic bowel in the left mid abdomen.  Vascular/Lymphatic: No evidence of abdominal aortic aneurysm.  Celiac artery, SMA, and IMA remain patent.  Reproductive: Prostatomegaly.  Other: No abdominopelvic ascites.  Tiny lucencies anteriorly beneath the anterior chest/abdominal wall (series 5/image 44) appear to be above the diaphragm when correlating with coronal imaging (series 8/image 38). No free air.  Small fat  containing right inguinal hernia.  Musculoskeletal: Degenerative changes of the lumbar spine.  IMPRESSION: Findings suspicious for high-grade partial or complete small bowel obstruction, with transition point in the left mid abdomen.  Pneumatosis involving multiple bowel loops in the left mid abdomen, worrisome for at risk/necrotic bowel.  Surgical consultation is advised.  Mild patchy bilateral lower lobe and lingular opacities, favored to reflect atelectasis. Pneumonia is considered less likely. Associated small bilateral pleural effusions.  Critical Value/emergent results were called by telephone at the time of interpretation on 08-04-2015 at 2:35 pm to Dr. Darylene Price , who verbally acknowledged these results.   Electronically Signed  By: Julian Hy M.D.  On: 2015/08/04 14:38   CT HEAD WITHOUT AND WITH CONTRAST  TECHNIQUE: Contiguous axial images were obtained from the base of the skull through the vertex without and with intravenous contrast  CONTRAST: 144mL OMNIPAQUE IOHEXOL 300 MG/ML SOLN  COMPARISON: CT head without contrast 07/06/2015. CT head without contrast 01/08/2015  FINDINGS: There is new area of hypoattenuation affecting the RIGHT occipital pole, without hemorrhage, not clearly present on previous CT scans, concerning for acute infarction. No similar changes on the LEFT, nor are there areas of definite brainstem or cerebellar hypoattenuation. Post infusion, no abnormal enhancement.  Generalized atrophy and chronic microvascular ischemic change throughout the white matter, both stable.  Calvarium intact. Advanced vascular calcification. 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  No abnormal postcontrast enhancement of the brain or meninges.   Electronically Signed  By: Staci Righter M.D.  On:  08-04-2015 14:24   Plan: I have requested consultation from General Surgery and discussed the situation over the telephone with Dr. Hulen Skains.  Prognosis is extremely poor but the patient's only chance for survival would likely require emergent laparotomy.   I reviewed the entire situation with the patient's wife and family.  They understand that the patient is critically ill and has an extremely poor prognosis.   All questions answered.    Rexene Alberts, MD Aug 04, 2015 2:37 PM

## 2015-07-10 NOTE — Progress Notes (Signed)
PULMONARY / CRITICAL CARE MEDICINE   Name: Antonio Page MRN: [id number] DOB: Dec 31, 1940    ADMISSION DATE:  06/25/2015 CONSULTATION DATE:  07/06/2015  REFERRING MD :  Lanelle Bal, M.D.  CHIEF COMPLAINT:  Obtundation & Acute Hypoxic Respiratory Failure  INITIAL PRESENTATION: 74 year old male with known history of diabetes mellitus and coronary artery disease as well as end-stage renal disease on hemodialysis who last underwent coronary artery stent placement to the RCA in 1999. Patient presented to Hospital and was admitted for angina. Given the severity of his underlying coronary artery disease Dr. Servando Snare took the patient to surgery for bypass on 10/25.  STUDIES:  CT HEAD W/O 10/28 - No acute abnormalities. Chronic microvascular ischemic changes. PORTABLE CXR 10/28 - personally reviewed by me shows a minimal left lower lobe infiltrate. Good positioning of endotracheal tube. Blunting of left costophrenic angle.  EEG - PENDING  SIGNIFICANT EVENTS: 10/20 - Admitted to Hospital 10/25 - CABG 10/26 - Extubation 10/28 - New Onset Atrial fibrillation 10/28 - Intubated for obtunded status & acute hypoxic respiratory failure  SUBJECTIVE:  Difficulty keeping MAP at goal Pseudomonas on blood cx  REVIEW OF SYSTEMS:  Unable to obtain as patient is currently intubated and sedated.  VITAL SIGNS: Temp:  [98.8 F (37.1 C)-103.7 F (39.8 C)] 103.7 F (39.8 C) (10/30 0400) Pulse Rate:  [32-116] 79 (10/30 1000) Resp:  [18-33] 20 (10/30 1000) BP: (153-230)/(35-39) 153/39 mmHg (10/30 0745) SpO2:  [85 %-100 %] 93 % (10/30 1000) Arterial Line BP: (107-270)/(27-54) 130/38 mmHg (10/30 1000) FiO2 (%):  [40 %-50 %] 50 % (10/30 0940) Weight:  [123 kg (271 lb 2.7 oz)] 123 kg (271 lb 2.7 oz) (10/30 0600) HEMODYNAMICS: CVP:  [8 mmHg-11 mmHg] 9 mmHg VENTILATOR SETTINGS: Vent Mode:  [-] PRVC FiO2 (%):  [40 %-50 %] 50 % Set Rate:  [16 bmp] 16 bmp Vt Set:  [600 mL] 600 mL PEEP:  [5 cmH20] 5  cmH20 Pressure Support:  [10 cmH20] 10 cmH20 Plateau Pressure:  [21 cmH20-27 cmH20] 22 cmH20 INTAKE / OUTPUT:  Intake/Output Summary (Last 24 hours) at Jul 15, 2015 1021 Last data filed at 07-15-15 1000  Gross per 24 hour  Intake 4462.21 ml  Output   1400 ml  Net 3062.21 ml    PHYSICAL EXAMINATION: General:  Sedated. No acute distress. Remains on ventilator Integument:  Warm & dry. No rash. Left internal jugular venous catheter in place. Sternal wound incision with drainage.  HEENT:  Endotracheal tube in place. Cloudy right sclera. No scleral injection. Cardiovascular:  Regular rate. No edema.  Pulmonary:  Coarse breath sounds bilaterally. Symmetric chest wall rise on ventilator. Abdomen: Soft. Normal bowel sounds. Protuberant. Rectal Foley in place. Musculoskeletal:  No joint deformity or effusion appreciated. Neurological:  Sedated. Left pupil round.   LABS:  CBC  Recent Labs Lab 07/06/15 0410 07/07/15 0416 2015/07/15 0353  WBC 7.8 12.4* 21.6*  HGB 8.1* 8.0* 8.8*  HCT 24.8* 24.0* 27.6*  PLT 118* 166 182   Coag's  Recent Labs Lab 06/14/2015 0645 06/26/2015 1350  APTT 32 34  INR 1.10 1.44   BMET  Recent Labs Lab 07/06/15 0410 07/07/15 0416 Jul 15, 2015 0353  NA 140 140 137  K 4.8 4.3 5.2*  CL 98* 100* 93*  CO2 28 25 19*  BUN 32* 30* 48*  CREATININE 6.67* 5.21* 7.47*  GLUCOSE 141* 166* 128*   Electrolytes  Recent Labs Lab 07/06/15 0410 07/07/15 0416 07/15/15 0353  CALCIUM 10.0 9.6 10.5*  MG 1.8 1.7 1.9  PHOS 6.0* 3.0 5.9*   Sepsis Markers  Recent Labs Lab 07/06/15 1430 07/06/15 1902 07/07/15 0416 2015-07-20 0353  LATICACIDVEN 1.2  --   --   --   PROCALCITON  --  13.03 25.81 109.02   ABG  Recent Labs Lab 07/07/15 0040 2015-07-20 0240 2015/07/20 0918  PHART 7.535* 7.348* 7.190*  PCO2ART 33.2* 33.1* 44.8  PO2ART 70.0* 75.0* 73.0*   Liver Enzymes  Recent Labs Lab 07/06/15 0410 07/07/15 0416 Jul 20, 2015 0353  AST  --  511* 813*  ALT  --  299*  661*  ALKPHOS  --  62 96  BILITOT  --  0.6 0.9  ALBUMIN 2.3* 1.9* 1.8*   Cardiac Enzymes No results for input(s): TROPONINI, PROBNP in the last 168 hours. Glucose  Recent Labs Lab 07/07/15 0744 07/07/15 1200 07/07/15 1609 07/07/15 1959 2015/07/20 0003 2015/07/20 0411  GLUCAP 160* 195* 172* 163* 148* 88    Imaging Dg Chest Port 1 View  Jul 20, 2015  CLINICAL DATA:  Atelectasis EXAM: PORTABLE CHEST 1 VIEW COMPARISON:  07/07/2015 FINDINGS: ET tube tip is above the carina. The feeding tube tip is of colo be GE junction. Normal heart size. No pleural effusion or edema. The lung volumes are low. IMPRESSION: 1. Stable support apparatus. 2. Cardiac enlargement and low lung volumes. Electronically Signed   By: Kerby Moors M.D.   On: Jul 20, 2015 08:23   US Abdomen Limited Ruq  07/07/2015  CLINICAL DATA:  Transaminitis, status post CABG on 07/01/2015 EXAM: US ABDOMEN LIMITED - RIGHT UPPER QUADRANT COMPARISON:  CT abdomen pelvis dated 01/17/2013 FINDINGS: Gallbladder: Gallbladder sludge. No gallstones, gallbladder wall thickening, or pericholecystic fluid. Sonographic Murphy's sign could not be assessed. Common bile duct: Diameter: 6 mm Liver: Hyperechoic hepatic parenchyma, suggesting hepatic steatosis. No focal hepatic lesion is seen. Additional comments:  Right pleural effusion. IMPRESSION: Gallbladder sludge, without associated sonographic findings to suggest acute cholecystitis. Suspected hepatic steatosis. Right pleural effusion. Electronically Signed   By: Julian Hy M.D.   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 [place] > GB sludge without cholecystitis Diarrhea - Possible C diff. P:   Protonix via tube Continuing tube feedings Trending LFTs daily  HEMATOLOGIC A:   Anemia of Chronic Disease - No signs of active bleeding. Hgb Stable. Thrombocytopenia - Improving. P:  Daily CBC to trend Hgb & Platelets SCDs Heparin [place] q8hr given ESRD  INFECTIOUS A:   Septic Shock HCAP Pseudomonal UTI and Bacteremia Possible C diff  P:  C diff (10/29)>>> negative Resp Ctx (10/28)>>> normal flora Blood Ctx (10/28)>>> pseudomonas Urine Ctx (10/28)>>> pseudomonas Sternal wound 10/29 >>   Abx:  Vancomycin, start date 10/25>>>  Tressie Ellis, start date 10/28>>>10/29 Meropenem 10/29 >>  levaquin 10/29 >>  Procalcitonin Algorithm Trending leukocyte count Appreciate ID input Possibly d/c vanco soon given cx data  ENDOCRINE A:   H/O DM   P:   Continuing accuchecks q4hr Increasing sliding scale insulin coverage to moderate algorithm  NEUROLOGIC A:   Altered mental status - delirium versus toxic metabolic encephalopathy.Likely   P:   RASS goal: 0 to -1 Morphine IV when necessary Ativan IV when necessary Limiting propofol given hypotension   FAMILY  - Updates: Wife, children, & grandchild updated with family conference 10/29  - Inter-disciplinary family meet or Palliative Care meeting due by:  11/04   TODAY'S SUMMARY: 74 year old Caucasian male with known history of coronary artery disease, OSA, & end-stage renal disease on hemodialysis. Patient underwent coronary artery bypass graft on  10/25. Successfully extubated on 10/26. Worsening delirium versus toxic metabolic encephalopathy coupled with progressively worsening acute hypoxic respiratory failure prompted intubation on 10/28. Preliminary blood culture results show pseudomonas. Continuing on broad-spectrum antibiotics at this time.   Critical care time 35 minutes  Baltazar Apo, MD, PhD 2015-07-14, 10:31 AM [facility] [phone] or if no answer [phone]

## 2015-07-10 DEATH — deceased

## 2015-07-12 ENCOUNTER — Ambulatory Visit: Payer: Medicare Other | Admitting: Physician Assistant

## 2015-07-12 LAB — CULTURE, BLOOD (ROUTINE X 2): Culture: NO GROWTH

## 2015-07-12 LAB — ANAEROBIC CULTURE: GRAM STAIN: NONE SEEN

## 2015-07-14 IMAGING — NM NM MISC PROCEDURE
3 series · 18 of 18 positions shown · non-contrast
Comparison: none

[Series 1: stress-gsp_(id)_sa · 6.4mm · 6.40mm/px · 6 of 512 frames shown]
[frame 43/512]
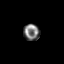
[frame 128/512]
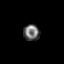
[frame 214/512]
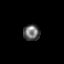
[frame 299/512]
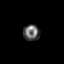
[frame 384/512]
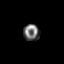
[frame 470/512]
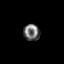

[Series 1: rest_(id)_sa · 6.4mm · 6.40mm/px · 6 of 64 frames shown]
[frame 6/64]
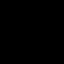
[frame 16/64]
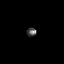
[frame 27/64]
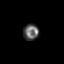
[frame 38/64]
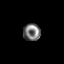
[frame 48/64]
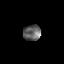
[frame 59/64]
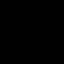

[Series 1: stress-sum-em_(id)_sa · 6.4mm · 6.40mm/px · 6 of 64 frames shown]
[frame 6/64]
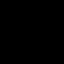
[frame 16/64]
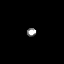
[frame 27/64]
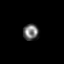
[frame 38/64]
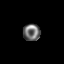
[frame 48/64]
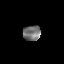
[frame 59/64]
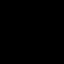

[18 of 18 positions shown; findings below may reference images not displayed]

Canned report from images found in remote index.

Refer to host system for actual result text.

## 2015-07-24 ENCOUNTER — Ambulatory Visit: Payer: Medicare Other | Admitting: Podiatry

## 2015-07-26 ENCOUNTER — Ambulatory Visit: Payer: Medicare Other | Admitting: Pulmonary Disease

## 2015-08-09 NOTE — Discharge Summary (Addendum)
Physician Discharge Summary       [address]       [place] [address]             [phone]    Patient ID: Antonio Page MRN: [id number] DOB/AGE: [birth year] 74 y.o.  Admit date: 07/09/2015 Discharge date: 07/17/2015  Principle Diagnoses: 1. Unstable angina 2.Coronary artery disease  Active Diagnoses:  3. Morbid obesity ([place]) 4. OSA (obstructive sleep apnea) 5. Diabetes mellitus type 2, insulin dependent ([place]) 6.  Uncontrolled hypertension 7.  End stage renal disease on dialysis ([place]) 8.  Anemia in chronic kidney disease 9. Complete heart block, temp pacemaker, s/p PPM MDT dual chamber 5/4/1t6 10.Acute respiratory failure with hypoxia ([facility]) 11.Transaminitis 12. Septic shock ([place]) 13. HCAP (healthcare-associated pneumonia) 6: Shock Liver  Consults: pulmonary/intensive care, ID and neurology, cardiology  Procedure (s):  Cardiac catheterization done by Dr. Gwenlyn Found on 06/20/2015:  LM lesion, 80% stenosed.  Ost Cx lesion, 95% stenosed.  Mid Cx lesion, 50% stenosed.  The left ventricular systolic function is normal. Coronary artery bypass grafting x2 with left internal mammary to left anterior descending coronary artery and reverse saphenous vein graft to the circumflex coronary artery with right thigh greater saphenous endo vein harvesting by Dr. Servando Snare on 06/18/2015.  History of Presenting Illness: This is a 74 year old male with a history of coronary artery disease (s/p PCI with stent to RCA in 99'), hypertension, diabetes, ESRD, remote tobacco abuse, OSA ( CPAP), CHB (s/p PPM 5/16'), and anemia of chronic disease. According to medical records, he was last admitted in May 16' with multiple episodes of syncope, hyperkalemia (K 6.2), and intermittent third degree heart block. He had a PPM placed. Echo done 5/16' showed LVEF 55-60%, mild MR, no AS/AI, and no pericardial effusion. He then had a myoview stress test on 02/20/15 that showed  inferolateral and apical infarct, no ischemia, EF 45, and an intermediate risk. He has been having substernal chest discomfort/pain with exertion (NOT at rest) and was seen by Richardson Dopp PA-C on 06/25/15. AN EKG showed A pacing, HR 60, RBBB, down sloping of ST segments in AVF, V5-6, and QTc 512 ms. He was started on Imdur 15 mg at bedtime (except dialysis days), consideration for starting a statin, and arrangement were made for a left heart catheterization. He underwent a left heart catheterization and was found to have 80% left main stenosis, 95% ostial circumflex, and LVEF was "normal". Echo done showed LVEF 45-50%, mild MR, but no other significant valvular problems. Dr. Servando Snare was consulted for consideration of coronary artery bypass grafting surgery. At the time of exam, his HR is in the 60's, BP 132/29, oxygen saturation 92% on 2 liters of oxygen via . He has complaints of sternal discomfort that is the same as he has had at home before. He denies shortness of breath, nausea, diaphoresis, syncope. Dr. Servando Snare discussed the need for coronary artery bypass grafting surgery. Potential risks, benefits, and complications were discussed with the patient and he agreed to proceed with surgery. Pre operative carotid duplex showed a 40-59% right ICA stenosis and a 1-39% left ICA stenosis. He underwent a CABG x 2 on 06/13/2015.  Brief Hospital Course:  He was extubated late the evening of surgery.Patient was AV paced (has PPM) with stable blood pressure initially. Nephrology continued to follow patient post op.  Potassium was 6.7 and HD was done on post op day one. He had mild thrombocytopenia. He was mildly confused as well. His  mental status was not what it was pre op. He had no focal neuro deficits-likely metabolic encephalopathy. CT of head was done. It showed a suspected acute right occipital pole infarction (non hemorrhagic). A neurology consult was obtained. CT was personally reviewed by Dr. Doy Mince  and there were no acute changes. It was questionable if there was a "shower of emboli" as had a fib. Cardiology consulted. He was given Amiodarone bolus. He developed a fever and leukocytosis. He was put on Vancomycin. Patient had HD 10/28. IR placed a left IJ CVC on 10/28.  He has OSA and was tolerating CPAP at night. He then developed hypoxia on 10/28. He was bagged with oxygen saturation increased from 80% to 98%. Anesthesia consulted to intubate as well as CCM/pulmonary to evaluate. Patient was given supplemental nutrition as NPO. Patient underwent a flexible bronchoscopy on 10/28. A minimal amount of thick, cloudy secretions were suctioned from the lumen of the endotracheal tube. Small amount of residual secretions were suctioned from the patient's trachea. Bilateral airways were examined revealing normal mucosa without erythema or edema.  Patient was on Neo synephrine drip. He remained sedated on vent. H and H was down to 8 and 24 so he was given 2 units of PRBCs. Blood cultures showed GNRs. Wound culture showed no organisms seen. He was now on Levophed and Vasopressin drips. He remained intubated and sedated. EEG was done 10/29. Results showed slow with triphasic waves which is consistent with patient clinically. No evidence of seizure.ID consult was obtained. Patient was now on Meropenem and Levofloxacin.CT of chest, abdomen and pelvis done 10/30 showed possible high grade or complete SBO, pneumatosis  involving multiple bowel loops in the left mid abdomen, worrisome for at risk/necrotic bowel. Mild patchy bilateral lower lobe and lingular opacities, favored to reflect atelectasis. Pneumonia is considered less likely. Associated small bilateral pleural effusions. A HD catheter was placed on 10/30. Dr. Roxy Manns discussed above findings with Dr. Hulen Skains (general surgery). Patient's prognosis is extremely poor and his only chance of survival would be emergent laparotomy. Dr. Roxy Manns spoke with patient's wife about  situation and that he is critically ill with a very poor prognosis. He became more acidotic. Family decided not to proceed with surgery and provide comfort care only. Patient expired the evening of 10/30.  Latest Vital Signs: Blood pressure 101/62, pulse 81, temperature 103.7 F (39.8 C), temperature source Oral, resp. rate 13, height 5\' 11"  (1.803 m), weight 271 lb 2.7 oz (123 kg), SpO2 100 %.   Recent laboratory studies:  Lab Results  Component Value Date   WBC 21.6* July 18, 2015   HGB 8.8* 2015-07-18   HCT 27.6* 2015/07/18   MCV 97.5 2015-07-18   PLT 182 Jul 18, 2015   Lab Results  Component Value Date   NA 135 2015-07-18   K 5.8* 2015/07/18   CL 88* 07/18/2015   CO2 18* 07/18/2015   CREATININE 7.38* July 18, 2015   GLUCOSE 133* 07-18-2015      Diagnostic Studies: Dg Abd 1 View  07/06/2015  CLINICAL DATA:  Evaluate NG tube placement EXAM: ABDOMEN - 1 VIEW COMPARISON:  None. FINDINGS: The feeding tube tip appears to be in the projection of the proximal duodenum. Dilated loops of bowel noted within the upper abdomen. IMPRESSION: Tip of feeding tube is in the projection of the expected location of the proximal duodenum. Electronically Signed   By: Kerby Moors M.D.   On: 07/06/2015 15:15   Ct Head Wo Contrast  07/06/2015  CLINICAL DATA:  73 year old  male with worsening confusion. EXAM: CT HEAD WITHOUT CONTRAST TECHNIQUE: Contiguous axial images were obtained from the base of the skull through the vertex without intravenous contrast. COMPARISON:  Head CT 01/2015. FINDINGS: Small sclerotic right globe. Patchy and confluent areas of decreased attenuation are noted throughout the deep and periventricular white matter of the cerebral hemispheres bilaterally, compatible with chronic microvascular ischemic disease. No acute intracranial abnormalities. Specifically, no evidence of acute intracranial hemorrhage, no definite findings of acute/subacute cerebral ischemia, no mass, mass effect,  hydrocephalus or abnormal intra or extra-axial fluid collections. Visualized paranasal sinuses and mastoids are well pneumatized. No acute displaced skull fractures are identified. IMPRESSION: 1. No acute intracranial abnormalities. 2. Chronic microvascular ischemic changes in cerebral white matter as above. Electronically Signed   By: Vinnie Langton M.D.   On: 07/06/2015 02:07   Ct Head W Wo Contrast  07/27/15  CLINICAL DATA:  Sepsis and fever. Chronic renal failure. Encephalopathy. EXAM: CT HEAD WITHOUT AND WITH CONTRAST TECHNIQUE: Contiguous axial images were obtained from the base of the skull through the vertex without and with intravenous contrast CONTRAST:  132mL OMNIPAQUE IOHEXOL 300 MG/ML  SOLN COMPARISON:  CT head without contrast 07/06/2015. CT head without contrast 01/08/2015 FINDINGS: There is new area of hypoattenuation affecting the RIGHT occipital pole, without hemorrhage, not clearly present on previous CT scans, concerning for acute infarction. No similar changes on the LEFT, nor are there areas of definite brainstem or cerebellar hypoattenuation. Post infusion, no abnormal enhancement. Generalized atrophy and chronic microvascular ischemic change throughout the white matter, both stable. Calvarium intact. Advanced vascular calcification. 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 No abnormal postcontrast enhancement of the brain or meninges. Electronically Signed   By: Staci Righter M.D.   On: 2015/07/27 14:24   Ct Chest W Contrast  2015-07-27  CLINICAL DATA:  Sepsis, fever, unresponsive. Acute hypoxic respiratory failure. End-stage renal disease. EXAM: CT CHEST, ABDOMEN, AND PELVIS WITH CONTRAST TECHNIQUE: Multidetector CT imaging of the chest, abdomen and pelvis was performed following the standard protocol during bolus administration of intravenous contrast. CONTRAST:   150mL OMNIPAQUE IOHEXOL 300 MG/ML  SOLN COMPARISON:  CTA chest dated 01/08/2015. CT abdomen pelvis dated 01/17/2013. FINDINGS: CT CHEST FINDINGS Mediastinum/Nodes: Cardiomegaly.  No pericardial effusion. Coronary atherosclerosis. Postsurgical changes related to prior CABG. Atherosclerotic calcifications of the aortic arch. Small mediastinal lymph nodes which do not meet pathologic CT size criteria. Visualized right thyroid is mildly nodular/heterogeneous. Lungs/Pleura: Endotracheal tube terminates 5 cm above the carina. Mild patchy bilateral lower lobe opacities (series 5/ image 31), favored to reflect dependent/compressive atelectasis, pneumonia not entirely excluded. Additional mild dependent atelectasis in the posterior bilateral upper lobes (series 6/image 16). Mild patchy opacity in the lingula (series 6/image 16), also likely reflecting atelectasis, less likely infection. Associated small bilateral pleural effusions. No suspicious pulmonary nodules. No frank interstitial edema. No pneumothorax. Musculoskeletal: Degenerative changes of the thoracic spine. Median sternotomy. CT ABDOMEN PELVIS FINDINGS Hepatobiliary: 2.8 cm hypoenhancing lesion in the lateral segment left hepatic lobe (series 5/ image 54) with probable peripheral nodular discontinuous enhancement, favored to reflect a benign hemangioma, grossly unchanged since 2014. Distended gallbladder with layering gallbladder sludge and/or noncalcified gallstones (series 5/image 66). No associated inflammatory changes. No intrahepatic or extrahepatic ductal dilatation. Pancreas: Within normal limits. Spleen: Within normal limits. Adrenals/Urinary Tract: Adrenal glands are within normal limits. Bilateral renal cortical atrophy. Vascular calcifications. No renal calculi or hydronephrosis. Bladder is mildly thick-walled  but underdistended. Stomach/Bowel: Stomach is notable for a weighted feeding tube and an enteric tube which terminates in the distal gastric  antrum. Multiple dilated loops of small bowel in the left mid abdomen, some of which demonstrate pneumatosis (for example, series 5/ images 81 and 93). Associated transition in the left mid abdomen (series 5/ image 95) to decompressed loops of small bowel in the right abdomen. Colon is relatively decompressed.  Rectal tube. This appearance is worrisome for high-grade partial or complete small bowel obstruction with at risk/necrotic bowel in the left mid abdomen. Vascular/Lymphatic: No evidence of abdominal aortic aneurysm. Celiac artery, SMA, and IMA remain patent. Reproductive: Prostatomegaly. Other: No abdominopelvic ascites. Tiny lucencies anteriorly beneath the anterior chest/abdominal wall (series 5/image 44) appear to be above the diaphragm when correlating with coronal imaging (series 8/image 38). No free air. Small fat containing right inguinal hernia. Musculoskeletal: Degenerative changes of the lumbar spine. IMPRESSION: Findings suspicious for high-grade partial or complete small bowel obstruction, with transition point in the left mid abdomen. Pneumatosis involving multiple bowel loops in the left mid abdomen, worrisome for at risk/necrotic bowel. Surgical consultation is advised. Mild patchy bilateral lower lobe and lingular opacities, favored to reflect atelectasis. Pneumonia is considered less likely. Associated small bilateral pleural effusions. Critical Value/emergent results were called by telephone at the time of interpretation on Jul 22, 2015 at 2:35 pm to Dr. Darylene Price , who verbally acknowledged these results. Electronically Signed   By: Julian Hy M.D.   On: 07-22-15 14:38   Ct Abdomen Pelvis W Contrast  07-22-15  CLINICAL DATA:  Sepsis, fever, unresponsive. Acute hypoxic respiratory failure. End-stage renal disease. EXAM: CT CHEST, ABDOMEN, AND PELVIS WITH CONTRAST TECHNIQUE: Multidetector CT imaging of the chest, abdomen and pelvis was performed following the standard  protocol during bolus administration of intravenous contrast. CONTRAST:  165mL OMNIPAQUE IOHEXOL 300 MG/ML  SOLN COMPARISON:  CTA chest dated 01/08/2015. CT abdomen pelvis dated 01/17/2013. FINDINGS: CT CHEST FINDINGS Mediastinum/Nodes: Cardiomegaly.  No pericardial effusion. Coronary atherosclerosis. Postsurgical changes related to prior CABG. Atherosclerotic calcifications of the aortic arch. Small mediastinal lymph nodes which do not meet pathologic CT size criteria. Visualized right thyroid is mildly nodular/heterogeneous. Lungs/Pleura: Endotracheal tube terminates 5 cm above the carina. Mild patchy bilateral lower lobe opacities (series 5/ image 31), favored to reflect dependent/compressive atelectasis, pneumonia not entirely excluded. Additional mild dependent atelectasis in the posterior bilateral upper lobes (series 6/image 16). Mild patchy opacity in the lingula (series 6/image 16), also likely reflecting atelectasis, less likely infection. Associated small bilateral pleural effusions. No suspicious pulmonary nodules. No frank interstitial edema. No pneumothorax. Musculoskeletal: Degenerative changes of the thoracic spine. Median sternotomy. CT ABDOMEN PELVIS FINDINGS Hepatobiliary: 2.8 cm hypoenhancing lesion in the lateral segment left hepatic lobe (series 5/ image 54) with probable peripheral nodular discontinuous enhancement, favored to reflect a benign hemangioma, grossly unchanged since 2014. Distended gallbladder with layering gallbladder sludge and/or noncalcified gallstones (series 5/image 66). No associated inflammatory changes. No intrahepatic or extrahepatic ductal dilatation. Pancreas: Within normal limits. Spleen: Within normal limits. Adrenals/Urinary Tract: Adrenal glands are within normal limits. Bilateral renal cortical atrophy. Vascular calcifications. No renal calculi or hydronephrosis. Bladder is mildly thick-walled but underdistended. Stomach/Bowel: Stomach is notable for a weighted  feeding tube and an enteric tube which terminates in the distal gastric antrum. Multiple dilated loops of small bowel in the left mid abdomen, some of which demonstrate pneumatosis (for example, series 5/ images 81 and 93). Associated transition in the left mid abdomen (series 5/  image 95) to decompressed loops of small bowel in the right abdomen. Colon is relatively decompressed.  Rectal tube. This appearance is worrisome for high-grade partial or complete small bowel obstruction with at risk/necrotic bowel in the left mid abdomen. Vascular/Lymphatic: No evidence of abdominal aortic aneurysm. Celiac artery, SMA, and IMA remain patent. Reproductive: Prostatomegaly. Other: No abdominopelvic ascites. Tiny lucencies anteriorly beneath the anterior chest/abdominal wall (series 5/image 44) appear to be above the diaphragm when correlating with coronal imaging (series 8/image 38). No free air. Small fat containing right inguinal hernia. Musculoskeletal: Degenerative changes of the lumbar spine. IMPRESSION: Findings suspicious for high-grade partial or complete small bowel obstruction, with transition point in the left mid abdomen. Pneumatosis involving multiple bowel loops in the left mid abdomen, worrisome for at risk/necrotic bowel. Surgical consultation is advised. Mild patchy bilateral lower lobe and lingular opacities, favored to reflect atelectasis. Pneumonia is considered less likely. Associated small bilateral pleural effusions. Critical Value/emergent results were called by telephone at the time of interpretation on 07/13/2015 at 2:35 pm to Dr. Darylene Price , who verbally acknowledged these results. Electronically Signed   By: Julian Hy M.D.   On: 07/13/2015 14:38   Ir Fluoro Guide Cv Line Left  07/06/2015  CLINICAL DATA:  Status post CABG. A right jugular sheath niece to be removed due to fever and additional central venous access has been requested. EXAM: NON-TUNNELED CENTRAL VENOUS CATHETER  PLACEMENT WITH ULTRASOUND AND FLUOROSCOPIC GUIDANCE FLUOROSCOPY TIME:  24 seconds. PROCEDURE: The procedure, risks, benefits, and alternatives were explained to the patient's wife. Questions regarding the procedure were encouraged and answered. The patient's wife understands and consents to the procedure. A time-out was performed prior to the procedure. The left neck and chest were prepped with chlorhexidine in a sterile fashion, and a sterile drape was applied covering the operative field. Maximum barrier sterile technique with sterile gowns and gloves were used for the procedure. Local anesthesia was provided with 1% lidocaine. Ultrasound was used to confirm patency of the left internal jugular vein. After creating a small venotomy incision, a 21 gauge needle was advanced into the left internal jugular vein under direct, real-time ultrasound guidance. Ultrasound image documentation was performed. After securing guidewire access, a peel-away sheath was placed over a guide wire. Utilizing guide wire measurement, a 6 [place], triple-lumen Power Line non tunneled catheter was cut to 26 cm. The catheter was then placed through the sheath and the sheath removed. Final catheter positioning was confirmed and documented with a fluoroscopic spot image. The catheter was aspirated and flushed with saline. The catheter exit site was secured with 0-Prolene retention sutures. COMPLICATIONS: None.  No pneumothorax. FINDINGS: After catheter placement, the tip lies at the cavoatrial junction. The catheter aspirates normally and is ready for immediate use. IMPRESSION: Placement of non-tunneled central venous catheter via the left internal jugular vein. The catheter tip lies at the cavoatrial junction. The catheter is ready for immediate use. Electronically Signed   By: Aletta Edouard M.D.   On: 07/06/2015 13:25   Ir US Guide Vasc Access Left  07/06/2015  CLINICAL DATA:  Status post CABG. A right jugular sheath niece to be  removed due to fever and additional central venous access has been requested. EXAM: NON-TUNNELED CENTRAL VENOUS CATHETER PLACEMENT WITH ULTRASOUND AND FLUOROSCOPIC GUIDANCE FLUOROSCOPY TIME:  24 seconds. PROCEDURE: The procedure, risks, benefits, and alternatives were explained to the patient's wife. Questions regarding the procedure were encouraged and answered. The patient's wife understands and consents to the  procedure. A time-out was performed prior to the procedure. The left neck and chest were prepped with chlorhexidine in a sterile fashion, and a sterile drape was applied covering the operative field. Maximum barrier sterile technique with sterile gowns and gloves were used for the procedure. Local anesthesia was provided with 1% lidocaine. Ultrasound was used to confirm patency of the left internal jugular vein. After creating a small venotomy incision, a 21 gauge needle was advanced into the left internal jugular vein under direct, real-time ultrasound guidance. Ultrasound image documentation was performed. After securing guidewire access, a peel-away sheath was placed over a guide wire. Utilizing guide wire measurement, a 6 [place], triple-lumen Power Line non tunneled catheter was cut to 26 cm. The catheter was then placed through the sheath and the sheath removed. Final catheter positioning was confirmed and documented with a fluoroscopic spot image. The catheter was aspirated and flushed with saline. The catheter exit site was secured with 0-Prolene retention sutures. COMPLICATIONS: None.  No pneumothorax. FINDINGS: After catheter placement, the tip lies at the cavoatrial junction. The catheter aspirates normally and is ready for immediate use. IMPRESSION: Placement of non-tunneled central venous catheter via the left internal jugular vein. The catheter tip lies at the cavoatrial junction. The catheter is ready for immediate use. Electronically Signed   By: Aletta Edouard M.D.   On: 07/06/2015 13:25    Dg Chest Port 1 View  08/05/2015  CLINICAL DATA:  Respiratory failure EXAM: PORTABLE CHEST 1 VIEW COMPARISON:  August 05, 2015 at 0520 hours FINDINGS: Endotracheal tube terminates 6 cm above the carina. Small left pleural effusion. Associated left lower lobe opacity, likely atelectasis. No frank interstitial edema. No pneumothorax. Cardiomegaly. Postsurgical changes related to prior CABG. Median sternotomy. Left subclavian pacemaker. Right IJ dual lumen catheter terminating in the mid SVC. IMPRESSION: Endotracheal tube terminates 6 cm above the carina. Small left pleural effusion. Associated left lower lobe opacity, likely atelectasis. Electronically Signed   By: Julian Hy M.D.   On: Aug 05, 2015 12:12   US Abdomen Limited Ruq  07/07/2015  CLINICAL DATA:  Transaminitis, status post CABG on 06/28/2015 EXAM: US ABDOMEN LIMITED - RIGHT UPPER QUADRANT COMPARISON:  CT abdomen pelvis dated 01/17/2013 FINDINGS: Gallbladder: Gallbladder sludge. No gallstones, gallbladder wall thickening, or pericholecystic fluid. Sonographic Murphy's sign could not be assessed. Common bile duct: Diameter: 6 mm Liver: Hyperechoic hepatic parenchyma, suggesting hepatic steatosis. No focal hepatic lesion is seen. Additional comments:  Right pleural effusion. IMPRESSION: Gallbladder sludge, without associated sonographic findings to suggest acute cholecystitis. Suspected hepatic steatosis. Right pleural effusion. Electronically Signed   By: Julian Hy M.D.   On: 07/07/2015 17:08     Signed: ZIMMERMAN,DONIELLE MPA-C 07/16/2015, 10:40 AM

## 2015-08-16 ENCOUNTER — Ambulatory Visit: Payer: Medicare Other | Admitting: Internal Medicine

## 2015-08-22 ENCOUNTER — Encounter (HOSPITAL_COMMUNITY): Payer: Self-pay | Admitting: *Deleted
